# Patient Record
Sex: Male | Born: 1945 | Race: White | Hispanic: No | Smoking: Current every day smoker
Health system: Southern US, Community
[De-identification: ages and names within clinical notes are randomized; demographics above are authoritative.]

## PROBLEM LIST (undated history)

## (undated) DIAGNOSIS — J449 Chronic obstructive pulmonary disease, unspecified: Secondary | ICD-10-CM

## (undated) DIAGNOSIS — E119 Type 2 diabetes mellitus without complications: Secondary | ICD-10-CM

## (undated) DIAGNOSIS — E785 Hyperlipidemia, unspecified: Secondary | ICD-10-CM

## (undated) DIAGNOSIS — M539 Dorsopathy, unspecified: Secondary | ICD-10-CM

## (undated) HISTORY — DX: Type 2 diabetes mellitus without complications: E11.9

## (undated) HISTORY — DX: Hyperlipidemia, unspecified: E78.5

## (undated) HISTORY — DX: Chronic obstructive pulmonary disease, unspecified: J44.9

## (undated) HISTORY — PX: NECK SURGERY: SHX720

## (undated) HISTORY — DX: Dorsopathy, unspecified: M53.9

---

## 1976-08-24 HISTORY — PX: HEMORRHOID SURGERY: SHX153

## 2005-08-24 HISTORY — PX: WRIST SURGERY: SHX841

## 2006-01-01 ENCOUNTER — Inpatient Hospital Stay: Payer: Self-pay | Admitting: Specialist

## 2006-08-24 HISTORY — PX: HERNIA REPAIR: SHX51

## 2007-04-19 ENCOUNTER — Ambulatory Visit: Payer: Self-pay | Admitting: General Surgery

## 2007-04-28 ENCOUNTER — Ambulatory Visit: Payer: Self-pay | Admitting: General Surgery

## 2007-05-13 ENCOUNTER — Ambulatory Visit: Payer: Self-pay | Admitting: General Surgery

## 2007-08-25 HISTORY — PX: ANKLE SURGERY: SHX546

## 2007-12-08 ENCOUNTER — Ambulatory Visit: Payer: Self-pay | Admitting: Vascular Surgery

## 2009-05-13 ENCOUNTER — Ambulatory Visit: Payer: Self-pay | Admitting: Anesthesiology

## 2009-10-22 ENCOUNTER — Ambulatory Visit: Payer: Self-pay | Admitting: Cardiovascular Disease

## 2009-10-22 ENCOUNTER — Inpatient Hospital Stay: Payer: Self-pay | Admitting: Specialist

## 2009-11-05 ENCOUNTER — Ambulatory Visit: Payer: Self-pay | Admitting: Cardiovascular Disease

## 2010-01-29 ENCOUNTER — Emergency Department: Payer: Self-pay | Admitting: Emergency Medicine

## 2010-06-28 ENCOUNTER — Encounter: Admission: RE | Admit: 2010-06-28 | Discharge: 2010-06-28 | Payer: Self-pay | Admitting: Neurosurgery

## 2010-12-27 ENCOUNTER — Emergency Department: Payer: Self-pay | Admitting: Emergency Medicine

## 2011-01-03 ENCOUNTER — Inpatient Hospital Stay: Payer: Self-pay | Admitting: Internal Medicine

## 2011-03-13 ENCOUNTER — Ambulatory Visit: Payer: Self-pay | Admitting: Anesthesiology

## 2014-09-27 DIAGNOSIS — E119 Type 2 diabetes mellitus without complications: Secondary | ICD-10-CM | POA: Diagnosis not present

## 2014-09-27 DIAGNOSIS — R0602 Shortness of breath: Secondary | ICD-10-CM | POA: Diagnosis not present

## 2014-09-27 DIAGNOSIS — J449 Chronic obstructive pulmonary disease, unspecified: Secondary | ICD-10-CM | POA: Diagnosis not present

## 2014-09-27 DIAGNOSIS — F17210 Nicotine dependence, cigarettes, uncomplicated: Secondary | ICD-10-CM | POA: Diagnosis not present

## 2014-11-21 DIAGNOSIS — F17210 Nicotine dependence, cigarettes, uncomplicated: Secondary | ICD-10-CM | POA: Diagnosis not present

## 2014-11-21 DIAGNOSIS — R319 Hematuria, unspecified: Secondary | ICD-10-CM | POA: Diagnosis not present

## 2014-11-21 DIAGNOSIS — N200 Calculus of kidney: Secondary | ICD-10-CM | POA: Diagnosis not present

## 2014-11-21 DIAGNOSIS — I10 Essential (primary) hypertension: Secondary | ICD-10-CM | POA: Diagnosis not present

## 2014-11-21 DIAGNOSIS — N390 Urinary tract infection, site not specified: Secondary | ICD-10-CM | POA: Diagnosis not present

## 2014-11-30 ENCOUNTER — Ambulatory Visit
Admit: 2014-11-30 | Disposition: A | Discharge status: Home / Self Care | Payer: Self-pay | Attending: Nurse Practitioner | Admitting: Nurse Practitioner

## 2014-11-30 DIAGNOSIS — N400 Enlarged prostate without lower urinary tract symptoms: Secondary | ICD-10-CM | POA: Diagnosis not present

## 2014-11-30 DIAGNOSIS — R310 Gross hematuria: Secondary | ICD-10-CM | POA: Diagnosis not present

## 2014-11-30 DIAGNOSIS — R102 Pelvic and perineal pain: Secondary | ICD-10-CM | POA: Diagnosis not present

## 2014-11-30 DIAGNOSIS — R319 Hematuria, unspecified: Secondary | ICD-10-CM | POA: Diagnosis not present

## 2014-11-30 DIAGNOSIS — R300 Dysuria: Secondary | ICD-10-CM | POA: Diagnosis not present

## 2014-12-27 DIAGNOSIS — J449 Chronic obstructive pulmonary disease, unspecified: Secondary | ICD-10-CM | POA: Diagnosis not present

## 2014-12-27 DIAGNOSIS — F17210 Nicotine dependence, cigarettes, uncomplicated: Secondary | ICD-10-CM | POA: Diagnosis not present

## 2014-12-27 DIAGNOSIS — J439 Emphysema, unspecified: Secondary | ICD-10-CM | POA: Diagnosis not present

## 2015-01-17 DIAGNOSIS — R319 Hematuria, unspecified: Secondary | ICD-10-CM | POA: Diagnosis not present

## 2015-01-17 DIAGNOSIS — F17210 Nicotine dependence, cigarettes, uncomplicated: Secondary | ICD-10-CM | POA: Diagnosis not present

## 2015-01-17 DIAGNOSIS — Z0001 Encounter for general adult medical examination with abnormal findings: Secondary | ICD-10-CM | POA: Diagnosis not present

## 2015-01-17 DIAGNOSIS — I10 Essential (primary) hypertension: Secondary | ICD-10-CM | POA: Diagnosis not present

## 2015-01-17 DIAGNOSIS — J449 Chronic obstructive pulmonary disease, unspecified: Secondary | ICD-10-CM | POA: Diagnosis not present

## 2015-01-17 DIAGNOSIS — R300 Dysuria: Secondary | ICD-10-CM | POA: Diagnosis not present

## 2015-03-05 DIAGNOSIS — M461 Sacroiliitis, not elsewhere classified: Secondary | ICD-10-CM | POA: Insufficient documentation

## 2015-07-01 DIAGNOSIS — T63301A Toxic effect of unspecified spider venom, accidental (unintentional), initial encounter: Secondary | ICD-10-CM | POA: Diagnosis not present

## 2015-07-02 DIAGNOSIS — B028 Zoster with other complications: Secondary | ICD-10-CM | POA: Diagnosis not present

## 2015-08-20 DIAGNOSIS — M150 Primary generalized (osteo)arthritis: Secondary | ICD-10-CM | POA: Diagnosis not present

## 2015-08-20 DIAGNOSIS — J449 Chronic obstructive pulmonary disease, unspecified: Secondary | ICD-10-CM | POA: Diagnosis not present

## 2015-08-20 DIAGNOSIS — B028 Zoster with other complications: Secondary | ICD-10-CM | POA: Diagnosis not present

## 2015-08-20 DIAGNOSIS — R21 Rash and other nonspecific skin eruption: Secondary | ICD-10-CM | POA: Diagnosis not present

## 2015-08-20 DIAGNOSIS — F17210 Nicotine dependence, cigarettes, uncomplicated: Secondary | ICD-10-CM | POA: Diagnosis not present

## 2015-09-30 DIAGNOSIS — I10 Essential (primary) hypertension: Secondary | ICD-10-CM | POA: Diagnosis not present

## 2015-09-30 DIAGNOSIS — B028 Zoster with other complications: Secondary | ICD-10-CM | POA: Diagnosis not present

## 2015-09-30 DIAGNOSIS — R21 Rash and other nonspecific skin eruption: Secondary | ICD-10-CM | POA: Diagnosis not present

## 2015-09-30 DIAGNOSIS — J4540 Moderate persistent asthma, uncomplicated: Secondary | ICD-10-CM | POA: Diagnosis not present

## 2015-09-30 DIAGNOSIS — F17210 Nicotine dependence, cigarettes, uncomplicated: Secondary | ICD-10-CM | POA: Diagnosis not present

## 2015-10-02 ENCOUNTER — Other Ambulatory Visit: Payer: Self-pay | Admitting: Nurse Practitioner

## 2015-10-02 DIAGNOSIS — B0229 Other postherpetic nervous system involvement: Secondary | ICD-10-CM

## 2015-10-28 ENCOUNTER — Ambulatory Visit: Payer: Medicare Other | Attending: Nurse Practitioner

## 2015-11-13 DIAGNOSIS — J449 Chronic obstructive pulmonary disease, unspecified: Secondary | ICD-10-CM | POA: Diagnosis not present

## 2015-11-13 DIAGNOSIS — R0602 Shortness of breath: Secondary | ICD-10-CM | POA: Diagnosis not present

## 2016-03-02 DIAGNOSIS — I739 Peripheral vascular disease, unspecified: Secondary | ICD-10-CM | POA: Diagnosis not present

## 2016-03-02 DIAGNOSIS — F17210 Nicotine dependence, cigarettes, uncomplicated: Secondary | ICD-10-CM | POA: Diagnosis not present

## 2016-03-02 DIAGNOSIS — R062 Wheezing: Secondary | ICD-10-CM | POA: Diagnosis not present

## 2016-03-02 DIAGNOSIS — Z0001 Encounter for general adult medical examination with abnormal findings: Secondary | ICD-10-CM | POA: Diagnosis not present

## 2016-03-02 DIAGNOSIS — J4540 Moderate persistent asthma, uncomplicated: Secondary | ICD-10-CM | POA: Diagnosis not present

## 2016-08-31 DIAGNOSIS — R05 Cough: Secondary | ICD-10-CM | POA: Diagnosis not present

## 2016-08-31 DIAGNOSIS — R350 Frequency of micturition: Secondary | ICD-10-CM | POA: Diagnosis not present

## 2016-08-31 DIAGNOSIS — E119 Type 2 diabetes mellitus without complications: Secondary | ICD-10-CM | POA: Diagnosis not present

## 2016-08-31 DIAGNOSIS — M150 Primary generalized (osteo)arthritis: Secondary | ICD-10-CM | POA: Diagnosis not present

## 2016-08-31 DIAGNOSIS — J449 Chronic obstructive pulmonary disease, unspecified: Secondary | ICD-10-CM | POA: Diagnosis not present

## 2016-09-14 DIAGNOSIS — I10 Essential (primary) hypertension: Secondary | ICD-10-CM | POA: Diagnosis not present

## 2016-09-14 DIAGNOSIS — M150 Primary generalized (osteo)arthritis: Secondary | ICD-10-CM | POA: Diagnosis not present

## 2016-09-14 DIAGNOSIS — J069 Acute upper respiratory infection, unspecified: Secondary | ICD-10-CM | POA: Diagnosis not present

## 2016-09-14 DIAGNOSIS — I739 Peripheral vascular disease, unspecified: Secondary | ICD-10-CM | POA: Diagnosis not present

## 2016-11-04 DIAGNOSIS — M792 Neuralgia and neuritis, unspecified: Secondary | ICD-10-CM | POA: Diagnosis not present

## 2016-11-04 DIAGNOSIS — I10 Essential (primary) hypertension: Secondary | ICD-10-CM | POA: Diagnosis not present

## 2016-11-04 DIAGNOSIS — R300 Dysuria: Secondary | ICD-10-CM | POA: Diagnosis not present

## 2016-11-05 ENCOUNTER — Encounter: Payer: Self-pay | Admitting: Urology

## 2016-11-05 ENCOUNTER — Ambulatory Visit (INDEPENDENT_AMBULATORY_CARE_PROVIDER_SITE_OTHER): Payer: Medicare Other | Admitting: Urology

## 2016-11-05 VITALS — BP 184/77 | HR 76 | Ht 68.0 in | Wt 174.7 lb

## 2016-11-05 DIAGNOSIS — N400 Enlarged prostate without lower urinary tract symptoms: Secondary | ICD-10-CM

## 2016-11-05 DIAGNOSIS — N3281 Overactive bladder: Secondary | ICD-10-CM

## 2016-11-05 DIAGNOSIS — R300 Dysuria: Secondary | ICD-10-CM | POA: Diagnosis not present

## 2016-11-05 DIAGNOSIS — N410 Acute prostatitis: Secondary | ICD-10-CM

## 2016-11-05 LAB — URINALYSIS, COMPLETE
BILIRUBIN UA: NEGATIVE
Glucose, UA: NEGATIVE
KETONES UA: NEGATIVE
LEUKOCYTES UA: NEGATIVE
Nitrite, UA: POSITIVE — AB
PROTEIN UA: NEGATIVE
RBC, UA: NEGATIVE
SPEC GRAV UA: 1.025 (ref 1.005–1.030)
Urobilinogen, Ur: 0.2 mg/dL (ref 0.2–1.0)
pH, UA: 5 (ref 5.0–7.5)

## 2016-11-05 LAB — BLADDER SCAN AMB NON-IMAGING: Scan Result: 27

## 2016-11-05 LAB — MICROSCOPIC EXAMINATION
Bacteria, UA: NONE SEEN
RBC MICROSCOPIC, UA: NONE SEEN /HPF (ref 0–?)

## 2016-11-05 MED ORDER — MIRABEGRON ER 50 MG PO TB24: 50.0000 mg | ORAL_TABLET | Freq: Every day | ORAL | 11 refills | Status: DC

## 2016-11-05 MED ORDER — CIPROFLOXACIN HCL 500 MG PO TABS: 500.0000 mg | ORAL_TABLET | Freq: Two times a day (BID) | ORAL | 0 refills | Status: DC

## 2016-11-05 NOTE — Progress Notes (Signed)
11/05/2016 9:46 AM   Brian Key [birth year] [id number]  Referring provider: Lavera Guise, MD [address] [place], [place] [address]  No chief complaint on file.   HPI: The patient is a 71 year old gentleman with a past metal history of BPH on Flomax presents today for urinary urgency and burning with urination. This has been going on for several months. His urine has been free of infection. He did have an infection with Escherichia coli that was pansensitive in March 2016.  He describes his biggest complaint this time is burning with urination. He says it is painful is passing a kidney stone. He does not have any symptoms that are new onset when he urinates. He was started on Cipro and Azo yesterday which has artery causing dramatic decrease in his symptoms. He is approximately 10 days left of Cipro.  The patient also complains of 5-6 year history of urinary urgency. He finds this bothersome particularly during social situations. He occasionally will have incontinence but he usually is able to make it to the bathroom before having incontinent episode. He does feel he empties his bladder. He denies a weak stream. He denies hesitancy or intermittency.  PVR: 26 cc  PMH: No past medical history on file.  Surgical History: No past surgical history on file.  Home Medications:  Allergies as of 11/05/2016   Not on File     Medication List       Accurate as of 11/05/16  9:46 AM. Always use your most recent med list.          ciprofloxacin 500 MG tablet Commonly known as:  CIPRO Take 1 tablet (500 mg total) by mouth every 12 (twelve) hours.   mirabegron ER 50 MG Tb24 tablet Commonly known as:  MYRBETRIQ Take 1 tablet (50 mg total) by mouth daily.       Allergies: Allergies not on file  Family History: No family history on file.  Social History:  reports that he has been smoking.  He has been smoking about 1.00 pack per day. He has never used smokeless tobacco. He  reports that he drinks alcohol. He reports that he does not use drugs.  ROS: UROLOGY Frequent Urination?: Yes Hard to postpone urination?: Yes Burning/pain with urination?: Yes Get up at night to urinate?: Yes Leakage of urine?: No Urine stream starts and stops?: No Trouble starting stream?: No Do you have to strain to urinate?: No Blood in urine?: No Urinary tract infection?: No Sexually transmitted disease?: No Injury to kidneys or bladder?: No Painful intercourse?: No                                      Physical Exam: BP (!) 184/77   Pulse 76   Ht 5\' 8"  (1.727 m)   Wt 174 lb 11.2 oz (79.2 kg)   BMI 26.56 kg/m   Constitutional:  Alert and oriented, No acute distress. HEENT: [place] AT, moist mucus membranes.  Trachea midline, no masses. Cardiovascular: No clubbing, cyanosis, or edema. Respiratory: Normal respiratory effort, no increased work of breathing. GI: Abdomen is soft, nontender, nondistended, no abdominal masses GU: No CVA tenderness. Normal phallus. Testicles descended bilaterally no masses not infectious. DRE: 2+ boggy consistent with prostatitis. Skin: No rashes, bruises or suspicious lesions. Lymph: No cervical or inguinal adenopathy. Neurologic: Grossly intact, no focal deficits, moving all 4 extremities. Psychiatric: Normal mood and affect.  Laboratory Data: No results found for: WBC, HGB, HCT, MCV, PLT  No results found for: CREATININE  No results found for: PSA  No results found for: TESTOSTERONE  No results found for: HGBA1C  Urinalysis No results found for: COLORURINE, APPEARANCEUR, LABSPEC, PHURINE, GLUCOSEU, HGBUR, BILIRUBINUR, KETONESUR, PROTEINUR, UROBILINOGEN, NITRITE, LEUKOCYTESUR   Assessment & Plan:    1. Prostatitis The patient will continue his Cipro. We will give him enough medication at this point to have a full one-month course of antibiotic. I will see him back in proximally 6-8 weeks to assess his  progress  2. Overactive bladder The patient's baseline urinary urgency consistent with overactive bladder. He does not have much in the way of obstructive symptoms. We'll start him him on Myrbetriq 50 mg daily. The patient has a problem with constipation so we will try to avoid anticholinergics.  Return in about 6 weeks (around 12/17/2016).  Nickie Retort, MD  [facility] [address] [place], [place] [address] [phone]

## 2016-12-18 ENCOUNTER — Ambulatory Visit: Payer: Medicare Other

## 2016-12-24 ENCOUNTER — Encounter: Payer: Self-pay | Admitting: Urology

## 2016-12-24 ENCOUNTER — Ambulatory Visit: Payer: Medicare Other | Admitting: Urology

## 2016-12-24 VITALS — BP 154/64 | HR 65 | Ht 68.0 in | Wt 180.0 lb

## 2016-12-24 DIAGNOSIS — N3281 Overactive bladder: Secondary | ICD-10-CM | POA: Diagnosis not present

## 2016-12-24 DIAGNOSIS — N410 Acute prostatitis: Secondary | ICD-10-CM

## 2016-12-24 DIAGNOSIS — N400 Enlarged prostate without lower urinary tract symptoms: Secondary | ICD-10-CM | POA: Diagnosis not present

## 2016-12-24 DIAGNOSIS — R300 Dysuria: Secondary | ICD-10-CM

## 2016-12-24 LAB — URINALYSIS, COMPLETE
BILIRUBIN UA: NEGATIVE
GLUCOSE, UA: NEGATIVE
KETONES UA: NEGATIVE
Leukocytes, UA: NEGATIVE
Nitrite, UA: NEGATIVE
Protein, UA: NEGATIVE
RBC, UA: NEGATIVE
Specific Gravity, UA: 1.025 (ref 1.005–1.030)
UUROB: 0.2 mg/dL (ref 0.2–1.0)
pH, UA: 6 (ref 5.0–7.5)

## 2016-12-24 MED ORDER — TAMSULOSIN HCL 0.4 MG PO CAPS: 0.4000 mg | ORAL_CAPSULE | Freq: Every day | ORAL | 11 refills | Status: DC

## 2016-12-24 NOTE — Progress Notes (Signed)
12/24/2016 11:36 AM   Brian Key 07/06/1946 [id number]  Referring provider: Lavera Guise, MD [address] [place], [place] [address]  Chief Complaint  Patient presents with  . Prostatitis    HPI: The patient is a 71 year old gentleman who presents today for follow-up.  1. Prostatitis   Follows up after being seen in March 2018 when he presented with severe dysuria that was new in onset. When he was sitting, he was on day to a ciprofloxacin his symptoms were starting to resolve. On exam, he had clinical prostatitis and was treated with a month of ciprofloxacin.  The patient still complains of persistent dysuria particularly in his distal penis. This only occurs during urination and ends approximate 1 minute after. He is concerned by this is a very uncomfortable. Does note that there is no spraying of his urine. He has a negative urinalysis today.  2. BPH The patient has been on Flomax in the past. He ran out recently. He would like to get back on obesity thought it helps with his urinary symptoms, consistency or was not as bothersome when he was on this.  3. Urinary urgency At his last visit, the patient was started on Myrbetriq 50 mg for 5 to six-year history of urinary urgency with occasional urge incontinence. He noticed symptomatic improvement with this medication. So has occasional urgency with incontinence that is much better from his baseline. He is happy with how much this medication has helped him would like to continue it.   PMH: Past Medical History:  Diagnosis Date  . COPD (chronic obstructive pulmonary disease) ([place])     Surgical History: Past Surgical History:  Procedure Laterality Date  . HEMORRHOID SURGERY    . NECK SURGERY    . WRIST SURGERY Left     Home Medications:  Allergies as of 12/24/2016      Reactions   Penicillins    Other reaction(s): Unknown      Medication List       Accurate as of 12/24/16 11:36 AM. Always use your most recent med  list.          albuterol (2.5 MG/3ML) 0.083% nebulizer solution Commonly known as:  PROVENTIL   BREO ELLIPTA 100-25 MCG/INH Aepb Generic drug:  fluticasone furoate-vilanterol   ciprofloxacin 500 MG tablet Commonly known as:  CIPRO Take 1 tablet (500 mg total) by mouth every 12 (twelve) hours.   gabapentin 600 MG tablet Commonly known as:  NEURONTIN take 1-2 tablets by mouth two to three times a day   mirabegron ER 50 MG Tb24 tablet Commonly known as:  MYRBETRIQ Take 1 tablet (50 mg total) by mouth daily.   tamsulosin 0.4 MG Caps capsule Commonly known as:  FLOMAX Take 1 capsule (0.4 mg total) by mouth daily.       Allergies:  Allergies  Allergen Reactions  . Penicillins     Other reaction(s): Unknown    Family History: Family History  Problem Relation Age of Onset  . Prostate cancer Neg Hx   . Bladder Cancer Neg Hx   . Kidney cancer Neg Hx     Social History:  reports that he has been smoking.  He has been smoking about 1.00 pack per day. He has never used smokeless tobacco. He reports that he drinks alcohol. He reports that he does not use drugs.  ROS: UROLOGY Frequent Urination?: No Hard to postpone urination?: Yes Burning/pain with urination?: Yes Get up at night to urinate?: No Leakage  of urine?: No Urine stream starts and stops?: No Trouble starting stream?: No Do you have to strain to urinate?: No Blood in urine?: No Urinary tract infection?: No Sexually transmitted disease?: No Injury to kidneys or bladder?: No Painful intercourse?: No Weak stream?: No Erection problems?: No Penile pain?: Yes  Gastrointestinal Nausea?: No Vomiting?: No Indigestion/heartburn?: No Diarrhea?: No Constipation?: No  Constitutional Fever: No Night sweats?: Yes Weight loss?: No Fatigue?: No  Skin Skin rash/lesions?: No Itching?: No  Eyes Blurred vision?: No Double vision?: No  Ears/Nose/Throat Sore throat?: No Sinus problems?:  No  Hematologic/Lymphatic Swollen glands?: No Easy bruising?: No  Cardiovascular Leg swelling?: No Chest pain?: No  Respiratory Cough?: No Shortness of breath?: No  Endocrine Excessive thirst?: No  Musculoskeletal Back pain?: Yes Joint pain?: No  Neurological Headaches?: No Dizziness?: No  Psychologic Depression?: No Anxiety?: No  Physical Exam: BP (!) 154/64 (BP Location: Left Arm, Patient Position: Sitting, Cuff Size: Normal)   Pulse 65   Ht 5\' 8"  (1.727 m)   Wt 180 lb (81.6 kg)   BMI 27.37 kg/m   Constitutional:  Alert and oriented, No acute distress. HEENT: [place] AT, moist mucus membranes.  Trachea midline, no masses. Cardiovascular: No clubbing, cyanosis, or edema. Respiratory: Normal respiratory effort, no increased work of breathing. GI: Abdomen is soft, nontender, nondistended, no abdominal masses GU: No CVA tenderness.  Skin: No rashes, bruises or suspicious lesions. Lymph: No cervical or inguinal adenopathy. Neurologic: Grossly intact, no focal deficits, moving all 4 extremities. Psychiatric: Normal mood and affect.  Laboratory Data: No results found for: WBC, HGB, HCT, MCV, PLT  No results found for: CREATININE  No results found for: PSA  No results found for: TESTOSTERONE  No results found for: HGBA1C  Urinalysis    Component Value Date/Time   APPEARANCEUR Clear 11/05/2016 0903   GLUCOSEU Negative 11/05/2016 0903   BILIRUBINUR Negative 11/05/2016 0903   PROTEINUR Negative 11/05/2016 0903   NITRITE Positive (A) 11/05/2016 0903   LEUKOCYTESUR Negative 11/05/2016 0903    Assessment & Plan:   1. BPH -restart flomax  2. OAB -continue Myrbetriq 50 mg  3. Prostatitis -resolved  4. Dysuria The patient has persistent dysuria after being treated for prostatitis. His infection is clinically resolved. I think at this point it would be reasonable to undergo cystoscopy to rule out urethral stricture as a possible source for this discomfort.  The patient is agreeable to proceeding with this procedure. We may want to consider obtaining a urine cytology during this procedure given his smoking history and his dysuria and OAB.  5. Prostate cancer screening The patient is requesting a PSA today. We'll check this value. His DRE at his last visit suggested infection but no sign of malignancy.  Return for cystoscopy.  Nickie Retort, MD  [facility] [address] [place], [place] [address] [phone]

## 2016-12-25 ENCOUNTER — Telehealth: Payer: Self-pay

## 2016-12-25 LAB — PSA: Prostate Specific Ag, Serum: 1 ng/mL (ref 0.0–4.0)

## 2016-12-25 NOTE — Telephone Encounter (Signed)
Brian Retort, MD  Lestine Box, LPN        Please let patient know PSA is normal. Thanks    [facility]- labs stable.

## 2017-01-20 ENCOUNTER — Ambulatory Visit (INDEPENDENT_AMBULATORY_CARE_PROVIDER_SITE_OTHER): Payer: Medicare Other | Admitting: Urology

## 2017-01-20 ENCOUNTER — Encounter: Payer: Self-pay | Admitting: Urology

## 2017-01-20 VITALS — BP 146/63 | HR 80 | Ht 68.0 in | Wt 176.9 lb

## 2017-01-20 DIAGNOSIS — N400 Enlarged prostate without lower urinary tract symptoms: Secondary | ICD-10-CM

## 2017-01-20 DIAGNOSIS — R300 Dysuria: Secondary | ICD-10-CM | POA: Diagnosis not present

## 2017-01-20 DIAGNOSIS — N3281 Overactive bladder: Secondary | ICD-10-CM

## 2017-01-20 MED ORDER — CIPROFLOXACIN HCL 500 MG PO TABS
500.0000 mg | ORAL_TABLET | Freq: Once | ORAL | Status: AC
  Administered 2017-01-20: 500 mg via ORAL

## 2017-01-20 MED ORDER — TAMSULOSIN HCL 0.4 MG PO CAPS: 0.8000 mg | ORAL_CAPSULE | Freq: Every day | ORAL | 11 refills | Status: DC

## 2017-01-20 MED ORDER — LIDOCAINE HCL 2 % EX GEL
1.0000 "application " | Freq: Once | CUTANEOUS | Status: AC
  Administered 2017-01-20: 1 via URETHRAL

## 2017-01-20 NOTE — Progress Notes (Signed)
   01/20/17  CC:  Chief Complaint  Patient presents with  . Cysto    HPI: The patient is a 71 year old gentleman who presents today for follow-up.  1. Dysuria Presents for cystoscopy for dysuria at tip of his penis that has been present since recent prostatitis bout. Urinalysis negative. The only occurs during urination last for approximately 1 minute. He had a slight improvement when starting Flomax back up at his last visit.  2. BPH Patient currently on flomax.   3. Urinary urgency At his last visit, the patient was started on Myrbetriq 50 mg for 5 to six-year history of urinary urgency with occasional urge incontinence. He noticed symptomatic improvement with this medication. So has occasional urgency with incontinence that is much better from his baseline. He is happy with how much this medication has helped him would like to continue it.  Blood pressure (!) 146/63, pulse 80, height 5\' 8"  (1.727 m), weight 176 lb 14.4 oz (80.2 kg). NED. A&Ox3.   No respiratory distress   Abd soft, NT, ND Normal phallus with bilateral descended testicles  Cystoscopy Procedure Note  Patient identification was confirmed, informed consent was obtained, and patient was prepped using Betadine solution.  Lidocaine jelly was administered per urethral meatus.    Preoperative abx where received prior to procedure.     Pre-Procedure: - Inspection reveals a normal caliber ureteral meatus.  Procedure: The flexible cystoscope was introduced without difficulty - No urethral strictures/lesions are present. - Enlarged prostate Visually obstructive - 7 cm in length - Elevated bladder neck - Bilateral ureteral orifices identified - Bladder mucosa  reveals no ulcers, tumors, or lesions - No bladder stones - No trabeculation  Retroflexion shows intravesical lobe   Post-Procedure: - Patient tolerated the procedure well  Assessment/ Plan:  1. BPH -will increase Flomax to 0.8 mg as a seem to  help with some of his dysuria.  2. OAB -continue Myrbetriq 50 mg  3. Dysuria -no evidence of stricture -will send urine for cytology due to significant smoking history -advised to try Azo if burning worsens  Follow-up in 3 months or if symptoms worsen sooner.

## 2017-01-21 LAB — URINALYSIS, COMPLETE
Bilirubin, UA: NEGATIVE
Glucose, UA: NEGATIVE
LEUKOCYTES UA: NEGATIVE
Nitrite, UA: NEGATIVE
PH UA: 6 (ref 5.0–7.5)
PROTEIN UA: NEGATIVE
RBC, UA: NEGATIVE
Specific Gravity, UA: 1.02 (ref 1.005–1.030)
UUROB: 0.2 mg/dL (ref 0.2–1.0)

## 2017-01-21 LAB — MICROSCOPIC EXAMINATION
BACTERIA UA: NONE SEEN
Epithelial Cells (non renal): NONE SEEN /hpf (ref 0–10)

## 2017-01-22 ENCOUNTER — Telehealth: Payer: Self-pay | Admitting: Urology

## 2017-01-22 NOTE — Telephone Encounter (Signed)
Brian Key called the office today.  The urine cytology specimen that was sent for this patient spilled during transit and there is not enough specimen to perform urine cytology.  Please contact the patient to come to the office to submit another urine sample to submit for urine cytology and add to the lab schedule.

## 2017-01-22 NOTE — Telephone Encounter (Signed)
Spoke to spouse and patient. Gave info per previous message. Spouse and patient verbalized understanding.  Pt will come by office next week.

## 2017-01-25 ENCOUNTER — Other Ambulatory Visit: Payer: Self-pay | Admitting: Urology

## 2017-01-26 ENCOUNTER — Other Ambulatory Visit: Payer: Medicare Other

## 2017-01-27 DIAGNOSIS — R300 Dysuria: Secondary | ICD-10-CM | POA: Diagnosis not present

## 2017-01-29 NOTE — Telephone Encounter (Signed)
Pt brought urine sample. Urine Cytology sent on 06/06.

## 2017-02-01 ENCOUNTER — Other Ambulatory Visit: Payer: Self-pay | Admitting: Urology

## 2017-02-03 ENCOUNTER — Encounter: Payer: Self-pay | Admitting: *Deleted

## 2017-02-03 ENCOUNTER — Other Ambulatory Visit: Payer: Self-pay | Admitting: Nurse Practitioner

## 2017-02-03 DIAGNOSIS — L723 Sebaceous cyst: Secondary | ICD-10-CM | POA: Diagnosis not present

## 2017-02-03 DIAGNOSIS — M545 Low back pain: Secondary | ICD-10-CM | POA: Diagnosis not present

## 2017-02-03 DIAGNOSIS — J449 Chronic obstructive pulmonary disease, unspecified: Secondary | ICD-10-CM | POA: Diagnosis not present

## 2017-02-03 DIAGNOSIS — M5116 Intervertebral disc disorders with radiculopathy, lumbar region: Secondary | ICD-10-CM | POA: Diagnosis not present

## 2017-02-09 ENCOUNTER — Ambulatory Visit
Admission: RE | Admit: 2017-02-09 | Discharge: 2017-02-09 | Disposition: A | Discharge status: Home / Self Care | Payer: Medicare Other | Source: Ambulatory Visit | Attending: Nurse Practitioner | Admitting: Nurse Practitioner

## 2017-02-09 DIAGNOSIS — M5126 Other intervertebral disc displacement, lumbar region: Secondary | ICD-10-CM | POA: Diagnosis not present

## 2017-02-09 DIAGNOSIS — M545 Low back pain: Secondary | ICD-10-CM | POA: Diagnosis present

## 2017-02-09 DIAGNOSIS — M48061 Spinal stenosis, lumbar region without neurogenic claudication: Secondary | ICD-10-CM | POA: Diagnosis not present

## 2017-02-09 DIAGNOSIS — M47816 Spondylosis without myelopathy or radiculopathy, lumbar region: Secondary | ICD-10-CM | POA: Diagnosis not present

## 2017-02-09 DIAGNOSIS — M2578 Osteophyte, vertebrae: Secondary | ICD-10-CM | POA: Diagnosis not present

## 2017-02-16 ENCOUNTER — Encounter: Payer: Self-pay | Admitting: General Surgery

## 2017-02-16 ENCOUNTER — Ambulatory Visit (INDEPENDENT_AMBULATORY_CARE_PROVIDER_SITE_OTHER): Payer: Medicare Other | Admitting: General Surgery

## 2017-02-16 VITALS — BP 140/80 | HR 60 | Resp 14 | Ht 69.0 in | Wt 183.0 lb

## 2017-02-16 DIAGNOSIS — D213 Benign neoplasm of connective and other soft tissue of thorax: Secondary | ICD-10-CM

## 2017-02-16 DIAGNOSIS — D171 Benign lipomatous neoplasm of skin and subcutaneous tissue of trunk: Secondary | ICD-10-CM | POA: Insufficient documentation

## 2017-02-16 HISTORY — PX: LIPOMA EXCISION: SHX5283

## 2017-02-16 NOTE — Progress Notes (Signed)
Patient ID: Brian Key, male   DOB: [birth year], 71 y.o.   MRN: [id number]  Chief Complaint  Patient presents with  . Cyst    HPI Brian Key is a 71 y.o. male.  Here for evaluation of a possible cyst on his back referred by Juliette Alcide. He states it has been there for over 10 years. He states 7-8 years ago, it was removed.Marland Kitchen He states it has gotten larger with a little pain over the past year.  Most recent HgbA1C was 5.5 2 months ago.  HPI  Past Medical History:  Diagnosis Date  . Back problem   . COPD (chronic obstructive pulmonary disease) ([place])     Past Surgical History:  Procedure Laterality Date  . ANKLE SURGERY  2009  . [place]  . HERNIA REPAIR  7425   umbilical  . NECK SURGERY    . WRIST SURGERY Left 2007    Family History  Problem Relation Age of Onset  . Prostate cancer Neg Hx   . Bladder Cancer Neg Hx   . Kidney cancer Neg Hx     Social History Social History  Substance Use Topics  . Smoking status: Current Every Day Smoker    Packs/day: 1.00    Years: 50.00  . Smokeless tobacco: Never Used  . Alcohol use Yes     Comment: occasionally    Allergies  Allergen Reactions  . Penicillins Rash    Other reaction(s): Unknown    Current Outpatient Prescriptions  Medication Sig Dispense Refill  . albuterol (PROVENTIL) (2.5 MG/3ML) 0.083% nebulizer solution   0  . BREO ELLIPTA 100-25 MCG/INH AEPB     . meloxicam (MOBIC) 15 MG tablet take 1 tablet by mouth once daily if needed for arthritis pain  0  . tamsulosin (FLOMAX) 0.4 MG CAPS capsule Take 1 capsule (0.4 mg total) by mouth daily. 30 capsule 11  . tiZANidine (ZANAFLEX) 4 MG tablet take 1/2-1 tablet by mouth at bedtime if needed for muscle spasm  0   No current facility-administered medications for this visit.     Review of Systems Review of Systems  Constitutional: Negative.   Respiratory: Negative.   Cardiovascular: Negative.     Blood pressure 140/80, pulse 60,  resp. rate 14, height 5\' 9"  (1.753 m), weight 183 lb (83 kg).  Physical Exam Physical Exam  Constitutional: He is oriented to person, place, and time. He appears well-developed and well-nourished.  HENT:  Mouth/Throat: Oropharynx is clear and moist.  Eyes: Conjunctivae are normal. No scleral icterus.  Neck: Neck supple.  Pulmonary/Chest:      Abdominal:    Lymphadenopathy:    He has no cervical adenopathy.  Neurological: He is alert and oriented to person, place, and time.  Skin: Skin is warm, dry and intact.  5 x 6 cm nodule left of midline T9  Psychiatric: His behavior is normal.    Data Reviewed February 03, 2017 PCP note completed by Leretha Pol, NP.   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    Plan    Elective excision was reviewed. The patient was amenable to proceed. ChloraPrep was applied to the skin. 20 mL of 0.5% Xylocaine with 0.25% Marcaine with 1-200,000 of epinephrine was utilized well tolerated. ChloraPrep was applied to the skin once again. The wound was draped. A transverse incision was utilized transversing the original curvilinear incision over the mass. The skin and subcutaneous tissue is divided sharply.  The lipoma was excised from the underlying soft tissue extending down to but not through the underlying fascia making use of sharp dissection. A single arterial bleeder was controlled with a 3-0 Vicryl suture ligature. The deep tissue was approximated with an interrupted 3-0 suture to obliterate dead space. The skin was closed with a running 3-0 Vicryl septic suture. Benzoin, Steri-Strips, Telfa and Tegaderm dressing applied.  The patient will make use of ice for local comfort.    Wound care was discussed. He is welcome to return next week for nursing check if desired. He'll be contacted with pathology results are available.  HPI, Physical Exam, Assessment and Plan have been scribed under the direction and in the presence of Robert Bellow, MD.  Karie Fetch, RN  I have completed the exam and reviewed the above documentation for accuracy and completeness.  I agree with the above.  Haematologist has been used and any errors in dictation or transcription are unintentional.  Hervey Ard, M.D., F.A.C.S.  Robert Bellow 02/16/2017, 9:03 PM

## 2017-02-16 NOTE — Patient Instructions (Signed)
The patient is aware to call back for any questions or concerns. May shower May remove dressing in 2-3 days Steri strips will gradually come off over 2-3 weeks May use an Ice pack as needed for comfort  

## 2017-02-23 ENCOUNTER — Other Ambulatory Visit: Payer: Self-pay | Admitting: Urology

## 2017-02-25 ENCOUNTER — Telehealth: Payer: Self-pay

## 2017-02-25 NOTE — Telephone Encounter (Signed)
-----   Message from Robert Bellow, MD sent at 02/25/2017  9:13 AM EDT ----- Please notify the patient that the tissue removed was indeed a fatty growth as anticipated. Follow up if needed. Thank you ----- Message ----- From: Interface, Lab In [place] Sent: 02/24/2017  11:27 PM To: Robert Bellow, MD

## 2017-02-26 NOTE — Telephone Encounter (Signed)
Notified patient as instructed, patient pleased. Discussed follow-up appointments, patient agrees  

## 2017-03-05 DIAGNOSIS — E782 Mixed hyperlipidemia: Secondary | ICD-10-CM | POA: Diagnosis not present

## 2017-03-05 DIAGNOSIS — I10 Essential (primary) hypertension: Secondary | ICD-10-CM | POA: Diagnosis not present

## 2017-03-05 DIAGNOSIS — Z0001 Encounter for general adult medical examination with abnormal findings: Secondary | ICD-10-CM | POA: Diagnosis not present

## 2017-03-05 DIAGNOSIS — J449 Chronic obstructive pulmonary disease, unspecified: Secondary | ICD-10-CM | POA: Diagnosis not present

## 2017-03-05 DIAGNOSIS — I739 Peripheral vascular disease, unspecified: Secondary | ICD-10-CM | POA: Diagnosis not present

## 2017-03-18 ENCOUNTER — Other Ambulatory Visit: Payer: Self-pay | Admitting: Neurosurgery

## 2017-03-18 DIAGNOSIS — R292 Abnormal reflex: Secondary | ICD-10-CM | POA: Diagnosis not present

## 2017-03-18 DIAGNOSIS — I739 Peripheral vascular disease, unspecified: Secondary | ICD-10-CM | POA: Diagnosis not present

## 2017-03-18 DIAGNOSIS — M47812 Spondylosis without myelopathy or radiculopathy, cervical region: Secondary | ICD-10-CM | POA: Diagnosis not present

## 2017-03-18 DIAGNOSIS — Z981 Arthrodesis status: Secondary | ICD-10-CM | POA: Diagnosis not present

## 2017-03-22 DIAGNOSIS — I739 Peripheral vascular disease, unspecified: Secondary | ICD-10-CM | POA: Diagnosis not present

## 2017-03-31 ENCOUNTER — Ambulatory Visit
Admission: RE | Admit: 2017-03-31 | Discharge: 2017-03-31 | Disposition: A | Discharge status: Home / Self Care | Payer: Medicare Other | Source: Ambulatory Visit | Attending: Neurosurgery | Admitting: Neurosurgery

## 2017-03-31 DIAGNOSIS — M50221 Other cervical disc displacement at C4-C5 level: Secondary | ICD-10-CM | POA: Diagnosis not present

## 2017-03-31 DIAGNOSIS — Z981 Arthrodesis status: Secondary | ICD-10-CM

## 2017-03-31 DIAGNOSIS — M5021 Other cervical disc displacement,  high cervical region: Secondary | ICD-10-CM | POA: Diagnosis not present

## 2017-03-31 DIAGNOSIS — M50223 Other cervical disc displacement at C6-C7 level: Secondary | ICD-10-CM | POA: Diagnosis not present

## 2017-03-31 DIAGNOSIS — R292 Abnormal reflex: Secondary | ICD-10-CM

## 2017-03-31 DIAGNOSIS — M50222 Other cervical disc displacement at C5-C6 level: Secondary | ICD-10-CM | POA: Diagnosis not present

## 2017-04-22 ENCOUNTER — Encounter: Payer: Self-pay | Admitting: Urology

## 2017-04-22 ENCOUNTER — Ambulatory Visit (INDEPENDENT_AMBULATORY_CARE_PROVIDER_SITE_OTHER): Payer: Medicare Other | Admitting: Urology

## 2017-04-22 VITALS — BP 123/62 | HR 71 | Ht 69.0 in | Wt 176.1 lb

## 2017-04-22 DIAGNOSIS — N400 Enlarged prostate without lower urinary tract symptoms: Secondary | ICD-10-CM

## 2017-04-22 DIAGNOSIS — R300 Dysuria: Secondary | ICD-10-CM | POA: Diagnosis not present

## 2017-04-22 NOTE — Progress Notes (Signed)
04/22/2017 4:33 PM   [name] 10-Jan-1946 [id number]  Referring provider: Lavera Guise, [place], [place] [address]  Chief Complaint  Patient presents with  . Over Active Bladder    HPI: The patient is a 71 year old gentleman who presents today for follow-up.  1. Dysuria Presents for cystoscopy for dysuria at tip of his penis that has been present since recent prostatitis bout. Urinalysis negative. The only occurs during urination last for approximately 1 minute. He had a slight improvement when starting Flomax. His flomax was increased at his last visit. He feels that this is slowly improving is not currently bothered by this.  2. BPH Patient currently on flomax 0.8 mg daily.   Patient with his obstructive 7 cm in length prostate and cystoscopy in May 2018. He also had intravesical lobe. Urine cytology was negative.  3. Urinary urgency At his last visit, the patient was started on Myrbetriq 50 mg for 5 to 6 year history of urinary urgency with occasional urge incontinence. He noticed symptomatic improvement with this medication. He however ran out of his medication. He has no wire taking. However, his symptoms have resolved. He has no significant urgency that he finds bothersome. No urge incontinence.     PMH: Past Medical History:  Diagnosis Date  . Back problem   . COPD (chronic obstructive pulmonary disease) ([place])     Surgical History: Past Surgical History:  Procedure Laterality Date  . ANKLE SURGERY  2009  . [place]  . HERNIA REPAIR  0109   umbilical  . NECK SURGERY    . WRIST SURGERY Left 2007    Home Medications:  Allergies as of 04/22/2017      Reactions   Penicillins Rash   Other reaction(s): Unknown      Medication List       Accurate as of 04/22/17  4:33 PM. Always use your most recent med list.          albuterol (2.5 MG/3ML) 0.083% nebulizer solution Commonly known as:  PROVENTIL   BREO ELLIPTA  100-25 MCG/INH Aepb Generic drug:  fluticasone furoate-vilanterol   meloxicam 15 MG tablet Commonly known as:  MOBIC take 1 tablet by mouth once daily if needed for arthritis pain   tamsulosin 0.4 MG Caps capsule Commonly known as:  FLOMAX Take 1 capsule (0.4 mg total) by mouth daily.   tiZANidine 4 MG tablet Commonly known as:  ZANAFLEX take 1/2-1 tablet by mouth at bedtime if needed for muscle spasm       Allergies:  Allergies  Allergen Reactions  . Penicillins Rash    Other reaction(s): Unknown    Family History: Family History  Problem Relation Age of Onset  . Prostate cancer Neg Hx   . Bladder Cancer Neg Hx   . Kidney cancer Neg Hx     Social History:  reports that he has been smoking.  He has a 50.00 pack-year smoking history. He has never used smokeless tobacco. He reports that he drinks alcohol. He reports that he does not use drugs.  ROS: UROLOGY Frequent Urination?: No Hard to postpone urination?: No Burning/pain with urination?: Yes Get up at night to urinate?: No Leakage of urine?: No Urine stream starts and stops?: No Trouble starting stream?: No Do you have to strain to urinate?: No Blood in urine?: No Urinary tract infection?: No Sexually transmitted disease?: No Injury to kidneys or bladder?: No Painful intercourse?: No Weak stream?: No Erection  problems?: No Penile pain?: No  Gastrointestinal Nausea?: No Vomiting?: No Indigestion/heartburn?: No Diarrhea?: No Constipation?: No  Constitutional Fever: No Night sweats?: Yes Weight loss?: No Fatigue?: No  Skin Skin rash/lesions?: No Itching?: No  Eyes Blurred vision?: No Double vision?: No  Ears/Nose/Throat Sore throat?: No Sinus problems?: No  Hematologic/Lymphatic Swollen glands?: No Easy bruising?: No  Cardiovascular Leg swelling?: No Chest pain?: No  Respiratory Cough?: Yes Shortness of breath?: No  Endocrine Excessive thirst?: No  Musculoskeletal Back  pain?: Yes Joint pain?: No  Neurological Headaches?: No Dizziness?: No  Psychologic Depression?: No Anxiety?: No  Physical Exam: BP 123/62 (BP Location: Left Arm, Patient Position: Sitting, Cuff Size: Normal)   Pulse 71   Ht 5\' 9"  (1.753 m)   Wt 176 lb 1.6 oz (79.9 kg)   BMI 26.01 kg/m   Constitutional:  Alert and oriented, No acute distress. HEENT: [place] AT, moist mucus membranes.  Trachea midline, no masses. Cardiovascular: No clubbing, cyanosis, or edema. Respiratory: Normal respiratory effort, no increased work of breathing. GI: Abdomen is soft, nontender, nondistended, no abdominal masses GU: No CVA tenderness.  Skin: No rashes, bruises or suspicious lesions. Lymph: No cervical or inguinal adenopathy. Neurologic: Grossly intact, no focal deficits, moving all 4 extremities. Psychiatric: Normal mood and affect.  Laboratory Data: No results found for: WBC, HGB, HCT, MCV, PLT  No results found for: CREATININE  No results found for: PSA  No results found for: TESTOSTERONE  No results found for: HGBA1C  Urinalysis    Component Value Date/Time   APPEARANCEUR Clear 01/20/2017 1601   GLUCOSEU Negative 01/20/2017 1601   BILIRUBINUR Negative 01/20/2017 1601   PROTEINUR Negative 01/20/2017 1601   NITRITE Negative 01/20/2017 1601   LEUKOCYTESUR Negative 01/20/2017 1601    Assessment & Plan:    1. BPH -continue Flomax to 0.8 mg  2. Dysuria -no evidence of stricture with negative urine cytology. However this continues to improve and is not currently bothersome to the patient. -advised to try Azo if burning worsens  Return in about 1 year (around 04/22/2018).  Nickie Retort, MD  [facility] [address], [place], [place] [address] [phone]

## 2017-06-03 DIAGNOSIS — J069 Acute upper respiratory infection, unspecified: Secondary | ICD-10-CM | POA: Diagnosis not present

## 2017-06-03 DIAGNOSIS — J449 Chronic obstructive pulmonary disease, unspecified: Secondary | ICD-10-CM | POA: Diagnosis not present

## 2017-06-03 DIAGNOSIS — M792 Neuralgia and neuritis, unspecified: Secondary | ICD-10-CM | POA: Diagnosis not present

## 2017-06-03 DIAGNOSIS — M5116 Intervertebral disc disorders with radiculopathy, lumbar region: Secondary | ICD-10-CM | POA: Diagnosis not present

## 2017-06-07 DIAGNOSIS — M542 Cervicalgia: Secondary | ICD-10-CM | POA: Diagnosis not present

## 2017-06-07 DIAGNOSIS — R030 Elevated blood-pressure reading, without diagnosis of hypertension: Secondary | ICD-10-CM | POA: Diagnosis not present

## 2017-06-07 DIAGNOSIS — M545 Low back pain: Secondary | ICD-10-CM | POA: Diagnosis not present

## 2017-06-21 DIAGNOSIS — M5416 Radiculopathy, lumbar region: Secondary | ICD-10-CM | POA: Diagnosis not present

## 2017-06-21 DIAGNOSIS — M48062 Spinal stenosis, lumbar region with neurogenic claudication: Secondary | ICD-10-CM | POA: Diagnosis not present

## 2017-07-06 DIAGNOSIS — M48062 Spinal stenosis, lumbar region with neurogenic claudication: Secondary | ICD-10-CM | POA: Diagnosis not present

## 2017-07-06 DIAGNOSIS — M5416 Radiculopathy, lumbar region: Secondary | ICD-10-CM | POA: Diagnosis not present

## 2017-09-02 ENCOUNTER — Ambulatory Visit: Payer: Self-pay | Admitting: Nurse Practitioner

## 2017-09-27 ENCOUNTER — Other Ambulatory Visit: Payer: Self-pay | Admitting: Internal Medicine

## 2017-09-27 DIAGNOSIS — R69 Illness, unspecified: Secondary | ICD-10-CM | POA: Diagnosis not present

## 2017-09-27 MED ORDER — BREO ELLIPTA 100-25 MCG/INH IN AEPB: 1.0000 | INHALATION_SPRAY | Freq: Every day | RESPIRATORY_TRACT | 1 refills | Status: DC

## 2017-09-27 MED ORDER — ALBUTEROL SULFATE (2.5 MG/3ML) 0.083% IN NEBU: 2.5000 mg | INHALATION_SOLUTION | RESPIRATORY_TRACT | 0 refills | Status: DC | PRN

## 2017-10-19 ENCOUNTER — Ambulatory Visit (INDEPENDENT_AMBULATORY_CARE_PROVIDER_SITE_OTHER): Payer: Medicare HMO | Admitting: Internal Medicine

## 2017-10-19 ENCOUNTER — Encounter: Payer: Self-pay | Admitting: Internal Medicine

## 2017-10-19 VITALS — BP 158/76 | HR 71 | Resp 16 | Ht 69.0 in | Wt 178.8 lb

## 2017-10-19 DIAGNOSIS — M461 Sacroiliitis, not elsewhere classified: Secondary | ICD-10-CM | POA: Diagnosis not present

## 2017-10-19 DIAGNOSIS — J209 Acute bronchitis, unspecified: Secondary | ICD-10-CM | POA: Diagnosis not present

## 2017-10-19 DIAGNOSIS — J440 Chronic obstructive pulmonary disease with acute lower respiratory infection: Secondary | ICD-10-CM | POA: Diagnosis not present

## 2017-10-19 DIAGNOSIS — I739 Peripheral vascular disease, unspecified: Secondary | ICD-10-CM | POA: Diagnosis not present

## 2017-10-19 DIAGNOSIS — E785 Hyperlipidemia, unspecified: Secondary | ICD-10-CM

## 2017-10-19 DIAGNOSIS — I10 Essential (primary) hypertension: Secondary | ICD-10-CM

## 2017-10-19 MED ORDER — DULOXETINE HCL 20 MG PO CPEP
20.0000 mg | ORAL_CAPSULE | Freq: Every day | ORAL | 3 refills | Status: DC
Start: 2017-10-19 — End: 2017-11-16

## 2017-10-19 MED ORDER — ALBUTEROL SULFATE HFA 108 (90 BASE) MCG/ACT IN AERS: 2.0000 | INHALATION_SPRAY | Freq: Four times a day (QID) | RESPIRATORY_TRACT | 0 refills | Status: DC | PRN

## 2017-10-19 MED ORDER — CILOSTAZOL 50 MG PO TABS: 50.0000 mg | ORAL_TABLET | Freq: Two times a day (BID) | ORAL | 3 refills | Status: DC

## 2017-10-19 MED ORDER — MELOXICAM 15 MG PO TABS: 15.0000 mg | ORAL_TABLET | Freq: Every day | ORAL | 3 refills | Status: DC

## 2017-10-19 MED ORDER — BREO ELLIPTA 100-25 MCG/INH IN AEPB: 1.0000 | INHALATION_SPRAY | Freq: Every day | RESPIRATORY_TRACT | 5 refills | Status: DC

## 2017-10-19 MED ORDER — BISOPROLOL-HYDROCHLOROTHIAZIDE 2.5-6.25 MG PO TABS: 1.0000 | ORAL_TABLET | Freq: Every day | ORAL | 3 refills | Status: DC

## 2017-10-19 NOTE — Progress Notes (Signed)
[facility] [place], [place] [address]  Internal MEDICINE  Office Visit Note  Patient Name: Brian Key  [id number]  [id number]  Date of Service: 10/19/2017  Chief Complaint  Patient presents with  . Osteoarthritis  . COPD    HPI  Pt is here for routine follow up.has multiple medical problems. He will like to get all his refills. Pt does think that his pain has been addressed. He does not have answer to his complaints. He does have PVD. Continues to smoke    Current Medication: Outpatient Encounter Medications as of 10/19/2017  Medication Sig  . albuterol (PROVENTIL HFA;VENTOLIN HFA) 108 (90 Base) MCG/ACT inhaler Inhale 2 puffs into the lungs every 6 (six) hours as needed for wheezing or shortness of breath.  . bisoprolol-hydrochlorothiazide (ZIAC) 2.5-6.25 MG tablet Take 1 tablet by mouth daily.  Marland Kitchen BREO ELLIPTA 100-25 MCG/INH AEPB Inhale 1 puff into the lungs daily.  . cilostazol (PLETAL) 50 MG tablet Take 1 tablet (50 mg total) by mouth 2 (two) times daily.  . meloxicam (MOBIC) 15 MG tablet Take 1 tablet (15 mg total) by mouth daily.  . tamsulosin (FLOMAX) 0.4 MG CAPS capsule Take 1 capsule (0.4 mg total) by mouth daily.  . [DISCONTINUED] albuterol (PROVENTIL) (2.5 MG/3ML) 0.083% nebulizer solution Take 3 mLs (2.5 mg total) by nebulization every 4 (four) hours as needed for wheezing or shortness of breath (every 4 to 6 hrs).  . [DISCONTINUED] BREO ELLIPTA 100-25 MCG/INH AEPB Inhale 1 puff into the lungs daily.  . [DISCONTINUED] cilostazol (PLETAL) 50 MG tablet take 1 tablet by mouth twice a day FOR LEG PAIN  . [DISCONTINUED] meloxicam (MOBIC) 15 MG tablet take 1 tablet by mouth once daily if needed for arthritis pain  . [DISCONTINUED] tiZANidine (ZANAFLEX) 4 MG tablet take 1/2-1 tablet by mouth at bedtime if needed for muscle spasm   No facility-administered encounter medications on file as of 10/19/2017.     Surgical History: Past Surgical  History:  Procedure Laterality Date  . ANKLE SURGERY  2009  . [place]  . HERNIA REPAIR  5009   umbilical  . NECK SURGERY    . WRIST SURGERY Left 2007    Medical History: Past Medical History:  Diagnosis Date  . Back problem   . COPD (chronic obstructive pulmonary disease) (HCC)     Family History: Family History  Problem Relation Age of Onset  . Prostate cancer Neg Hx   . Bladder Cancer Neg Hx   . Kidney cancer Neg Hx     Social History   Socioeconomic History  . Marital status: Married    Spouse name: Not on file  . Number of children: Not on file  . Years of education: Not on file  . Highest education level: Not on file  Social Needs  . Financial resource strain: Not on file  . Food insecurity - worry: Not on file  . Food insecurity - inability: Not on file  . Transportation needs - medical: Not on file  . Transportation needs - non-medical: Not on file  Occupational History  . Not on file  Tobacco Use  . Smoking status: Current Every Day Smoker    Packs/day: 1.00    Years: 50.00    Pack years: 50.00  . Smokeless tobacco: Never Used  Substance and Sexual Activity  . Alcohol use: Yes    Comment: occasionally  . Drug use: No  . Sexual activity: Not on  file  Other Topics Concern  . Not on file  Social History Narrative  . Not on file    Review of Systems  Constitutional: Negative for chills, fatigue and unexpected weight change.  HENT: Positive for postnasal drip. Negative for congestion, rhinorrhea, sneezing and sore throat.   Eyes: Negative for redness.  Respiratory: Negative for cough, chest tightness and shortness of breath.   Cardiovascular: Negative for chest pain and palpitations.  Gastrointestinal: Negative for abdominal pain, constipation, diarrhea, nausea and vomiting.  Genitourinary: Negative for dysuria and frequency.  Musculoskeletal: Positive for back pain and myalgias. Negative for arthralgias, joint swelling and neck  pain.  Skin: Negative for rash.  Neurological: Negative.  Negative for tremors and numbness.  Hematological: Negative for adenopathy. Does not bruise/bleed easily.  Psychiatric/Behavioral: Negative for behavioral problems (Depression), sleep disturbance and suicidal ideas. The patient is not nervous/anxious.    Vital Signs: BP (!) 158/76 (BP Location: Left Arm, Patient Position: Sitting)   Pulse 71   Resp 16   Ht '5\' 9"'$  (1.753 m)   Wt 178 lb 12.8 oz (81.1 kg)   SpO2 95%   BMI 26.40 kg/m   Physical Exam  Constitutional: He is oriented to person, place, and time. He appears well-developed and well-nourished. No distress.  HENT:  Head: Normocephalic and atraumatic.  Mouth/Throat: Oropharynx is clear and moist. No oropharyngeal exudate.  Eyes: EOM are normal. Pupils are equal, round, and reactive to light.  Neck: Normal range of motion. Neck supple. No JVD present. No tracheal deviation present. No thyromegaly present.  Cardiovascular: Normal rate, regular rhythm and normal heart sounds. Exam reveals no gallop and no friction rub.  No murmur heard. Pulmonary/Chest: Effort normal. No respiratory distress. He has no wheezes. He has no rales. He exhibits no tenderness.  Abdominal: Soft. Bowel sounds are normal.  Musculoskeletal: Normal range of motion.  Lymphadenopathy:    He has no cervical adenopathy.  Neurological: He is alert and oriented to person, place, and time. No cranial nerve deficit.  Skin: Skin is warm and dry. He is not diaphoretic.  Psychiatric: He has a normal mood and affect. His behavior is normal. Judgment and thought content normal.    Assessment/Plan: 1. Claudication (HCC) - cilostazol (PLETAL) 50 MG tablet; Take 1 tablet (50 mg total) by mouth 2 (two) times daily.  Dispense: 60 tablet; Refill: 3 - TSH - T4, free - DULoxetine (CYMBALTA) 20 MG capsule; Take 1 capsule (20 mg total) by mouth daily.  Dispense: 30 capsule; Refill: 3  2. Acute bronchitis with COPD  ([place]) - albuterol (PROVENTIL HFA;VENTOLIN HFA) 108 (90 Base) MCG/ACT inhaler; Inhale 2 puffs into the lungs every 6 (six) hours as needed for wheezing or shortness of breath.  Dispense: 1 Inhaler; Refill: 0 - BREO ELLIPTA 100-25 MCG/INH AEPB; Inhale 1 puff into the lungs daily.  Dispense: 30 each; Refill: 5 - CBC with Differential/Platelet - Comprehensive metabolic panel  3. Hyperlipidemia, unspecified hyperlipidemia type - Lipid Panel With LDL/HDL Ratio - Comprehensive metabolic panel  4. 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HCC) - meloxicam (MOBIC) 15 MG tablet; Take 1 tablet (15 mg total) by mouth daily.  Dispense: 30 tablet; Refill: 3 - ANA w/Reflex - Uric acid - Rheumatoid Factor - Sed Rate (ESR)  5. Benign hypertension - bisoprolol-hydrochlorothiazide (ZIAC) 2.5-6.25 MG tablet; Take 1 tablet by mouth daily.  Dispense: 30 tablet; Refill: 3 - Urinalysis  General Counseling: Esau verbalizes understanding of the findings of todays visit and agrees with plan of treatment.  I have discussed any further diagnostic evaluation that may be needed or ordered today. We also reviewed his medications today. he has been encouraged to call the office with any questions or concerns that should arise related to todays visit.    Orders Placed This Encounter  Procedures  . CBC with Differential/Platelet  . Lipid Panel With LDL/HDL Ratio  . TSH  . T4, free  . Comprehensive metabolic panel  . Urinalysis    Meds ordered this encounter  Medications  . albuterol (PROVENTIL HFA;VENTOLIN HFA) 108 (90 Base) MCG/ACT inhaler    Sig: Inhale 2 puffs into the lungs every 6 (six) hours as needed for wheezing or shortness of breath.    Dispense:  1 Inhaler    Refill:  0  . BREO ELLIPTA 100-25 MCG/INH AEPB    Sig: Inhale 1 puff into the lungs daily.    Dispense:  30 each    Refill:  5  . meloxicam (MOBIC) 15 MG tablet    Sig: Take 1 tablet (15 mg total) by mouth daily.    Dispense:  30 tablet     Refill:  3  . cilostazol (PLETAL) 50 MG tablet    Sig: Take 1 tablet (50 mg total) by mouth 2 (two) times daily.    Dispense:  60 tablet    Refill:  3  . bisoprolol-hydrochlorothiazide (ZIAC) 2.5-6.25 MG tablet    Sig: Take 1 tablet by mouth daily.    Dispense:  30 tablet    Refill:  3    Time spent:25 Minutes   Dr Lavera Guise Internal medicine

## 2017-10-21 DIAGNOSIS — I739 Peripheral vascular disease, unspecified: Secondary | ICD-10-CM | POA: Diagnosis not present

## 2017-10-21 DIAGNOSIS — J209 Acute bronchitis, unspecified: Secondary | ICD-10-CM | POA: Diagnosis not present

## 2017-10-21 DIAGNOSIS — J440 Chronic obstructive pulmonary disease with acute lower respiratory infection: Secondary | ICD-10-CM | POA: Diagnosis not present

## 2017-10-21 DIAGNOSIS — E785 Hyperlipidemia, unspecified: Secondary | ICD-10-CM | POA: Diagnosis not present

## 2017-10-21 DIAGNOSIS — M461 Sacroiliitis, not elsewhere classified: Secondary | ICD-10-CM | POA: Diagnosis not present

## 2017-10-22 LAB — LIPID PANEL WITH LDL/HDL RATIO
Cholesterol, Total: 231 mg/dL — ABNORMAL HIGH (ref 100–199)
HDL: 36 mg/dL — AB (ref >39)
LDL Calculated: 170 mg/dL — ABNORMAL HIGH (ref 0–99)
LDL/HDL RATIO: 4.7 ratio — AB (ref 0.0–3.6)
TRIGLYCERIDES: 126 mg/dL (ref 0–149)
VLDL Cholesterol Cal: 25 mg/dL (ref 5–40)

## 2017-10-22 LAB — COMPREHENSIVE METABOLIC PANEL
ALK PHOS: 88 IU/L (ref 39–117)
ALT: 8 IU/L (ref 0–44)
AST: 11 IU/L (ref 0–40)
Albumin/Globulin Ratio: 2 (ref 1.2–2.2)
Albumin: 4.1 g/dL (ref 3.5–4.8)
BILIRUBIN TOTAL: 0.2 mg/dL (ref 0.0–1.2)
BUN/Creatinine Ratio: 14 (ref 10–24)
BUN: 16 mg/dL (ref 8–27)
CHLORIDE: 101 mmol/L (ref 96–106)
CO2: 22 mmol/L (ref 20–29)
CREATININE: 1.16 mg/dL (ref 0.76–1.27)
Calcium: 8.9 mg/dL (ref 8.6–10.2)
GFR calc Af Amer: 73 mL/min/{1.73_m2} (ref >59)
GFR calc non Af Amer: 63 mL/min/{1.73_m2} (ref >59)
GLUCOSE: 157 mg/dL — AB (ref 65–99)
Globulin, Total: 2.1 g/dL (ref 1.5–4.5)
Potassium: 4.1 mmol/L (ref 3.5–5.2)
Sodium: 140 mmol/L (ref 134–144)
Total Protein: 6.2 g/dL (ref 6.0–8.5)

## 2017-10-22 LAB — CBC WITH DIFFERENTIAL/PLATELET
Basophils Absolute: 0.1 10*3/uL (ref 0.0–0.2)
Basos: 1 %
EOS (ABSOLUTE): 0.1 10*3/uL (ref 0.0–0.4)
EOS: 1 %
HEMATOCRIT: 49.5 % (ref 37.5–51.0)
Hemoglobin: 17.7 g/dL (ref 13.0–17.7)
IMMATURE GRANULOCYTES: 0 %
Immature Grans (Abs): 0 10*3/uL (ref 0.0–0.1)
LYMPHS ABS: 2.2 10*3/uL (ref 0.7–3.1)
Lymphs: 29 %
MCH: 33 pg (ref 26.6–33.0)
MCHC: 35.8 g/dL — AB (ref 31.5–35.7)
MCV: 92 fL (ref 79–97)
MONOS ABS: 0.4 10*3/uL (ref 0.1–0.9)
Monocytes: 5 %
NEUTROS PCT: 64 %
Neutrophils Absolute: 4.8 10*3/uL (ref 1.4–7.0)
PLATELETS: 180 10*3/uL (ref 150–379)
RBC: 5.36 x10E6/uL (ref 4.14–5.80)
RDW: 13.7 % (ref 12.3–15.4)
WBC: 7.5 10*3/uL (ref 3.4–10.8)

## 2017-10-22 LAB — URIC ACID: Uric Acid: 5.2 mg/dL (ref 3.7–8.6)

## 2017-10-22 LAB — T4, FREE: FREE T4: 1.26 ng/dL (ref 0.82–1.77)

## 2017-10-22 LAB — RHEUMATOID FACTOR: Rhuematoid fact SerPl-aCnc: <10 IU/mL (ref 0.0–13.9)

## 2017-10-22 LAB — ANA W/REFLEX: ANA: NEGATIVE

## 2017-10-22 LAB — TSH: TSH: 1.47 u[IU]/mL (ref 0.450–4.500)

## 2017-10-22 LAB — SEDIMENTATION RATE: Sed Rate: 2 mm/hr (ref 0–30)

## 2017-11-04 DIAGNOSIS — I739 Peripheral vascular disease, unspecified: Secondary | ICD-10-CM | POA: Diagnosis not present

## 2017-11-04 DIAGNOSIS — Z720 Tobacco use: Secondary | ICD-10-CM | POA: Diagnosis not present

## 2017-11-04 DIAGNOSIS — N400 Enlarged prostate without lower urinary tract symptoms: Secondary | ICD-10-CM | POA: Diagnosis not present

## 2017-11-04 DIAGNOSIS — I10 Essential (primary) hypertension: Secondary | ICD-10-CM | POA: Diagnosis not present

## 2017-11-04 DIAGNOSIS — N529 Male erectile dysfunction, unspecified: Secondary | ICD-10-CM | POA: Diagnosis not present

## 2017-11-04 DIAGNOSIS — Z791 Long term (current) use of non-steroidal anti-inflammatories (NSAID): Secondary | ICD-10-CM | POA: Diagnosis not present

## 2017-11-04 DIAGNOSIS — R69 Illness, unspecified: Secondary | ICD-10-CM | POA: Diagnosis not present

## 2017-11-04 DIAGNOSIS — E1151 Type 2 diabetes mellitus with diabetic peripheral angiopathy without gangrene: Secondary | ICD-10-CM | POA: Diagnosis not present

## 2017-11-04 DIAGNOSIS — J449 Chronic obstructive pulmonary disease, unspecified: Secondary | ICD-10-CM | POA: Diagnosis not present

## 2017-11-08 ENCOUNTER — Telehealth: Payer: Self-pay

## 2017-11-08 ENCOUNTER — Other Ambulatory Visit: Payer: Self-pay | Admitting: Internal Medicine

## 2017-11-08 ENCOUNTER — Other Ambulatory Visit: Payer: Self-pay

## 2017-11-08 DIAGNOSIS — J440 Chronic obstructive pulmonary disease with acute lower respiratory infection: Secondary | ICD-10-CM

## 2017-11-08 DIAGNOSIS — J209 Acute bronchitis, unspecified: Secondary | ICD-10-CM

## 2017-11-08 DIAGNOSIS — R69 Illness, unspecified: Secondary | ICD-10-CM | POA: Diagnosis not present

## 2017-11-08 MED ORDER — TIZANIDINE HCL 4 MG PO TABS: ORAL_TABLET | ORAL | 1 refills | Status: DC

## 2017-11-08 NOTE — Telephone Encounter (Signed)
[facility] called requesting refill on albuterol neb solution.  I gave verbal over phone to fill prescription.  #241ml with 5 refills.  dbs

## 2017-11-16 ENCOUNTER — Ambulatory Visit (INDEPENDENT_AMBULATORY_CARE_PROVIDER_SITE_OTHER): Payer: Medicare HMO | Admitting: Internal Medicine

## 2017-11-16 ENCOUNTER — Encounter: Payer: Self-pay | Admitting: Internal Medicine

## 2017-11-16 VITALS — BP 138/62 | HR 55 | Resp 16 | Ht 69.0 in | Wt 174.0 lb

## 2017-11-16 DIAGNOSIS — J449 Chronic obstructive pulmonary disease, unspecified: Secondary | ICD-10-CM | POA: Diagnosis not present

## 2017-11-16 DIAGNOSIS — E1165 Type 2 diabetes mellitus with hyperglycemia: Secondary | ICD-10-CM

## 2017-11-16 DIAGNOSIS — I739 Peripheral vascular disease, unspecified: Secondary | ICD-10-CM | POA: Diagnosis not present

## 2017-11-16 DIAGNOSIS — L989 Disorder of the skin and subcutaneous tissue, unspecified: Secondary | ICD-10-CM | POA: Diagnosis not present

## 2017-11-16 DIAGNOSIS — E785 Hyperlipidemia, unspecified: Secondary | ICD-10-CM | POA: Diagnosis not present

## 2017-11-16 LAB — POCT GLYCOSYLATED HEMOGLOBIN (HGB A1C): Hemoglobin A1C: 5.7

## 2017-11-16 MED ORDER — ATORVASTATIN CALCIUM 10 MG PO TABS: 10.0000 mg | ORAL_TABLET | Freq: Every day | ORAL | 3 refills | Status: DC

## 2017-11-16 MED ORDER — DULOXETINE HCL 30 MG PO CPEP: 30.0000 mg | ORAL_CAPSULE | Freq: Every day | ORAL | 3 refills | Status: DC

## 2017-11-16 NOTE — Progress Notes (Signed)
[facility] [place], [place] [address]  Internal MEDICINE  Office Visit Note  Patient Name: Brian Key  [id number]  [id number]  Date of Service: 11/16/2017  Chief Complaint  Patient presents with  . COPD  . Osteoarthritis  . Hyperlipidemia  . other    COPD  There is no cough or shortness of breath. This is a chronic problem. The current episode started more than 1 year ago. The problem has been gradually improving (pt is on Breo and albuterol ). Associated symptoms include postnasal drip. Pertinent negatives include no chest pain, rhinorrhea, sneezing or sore throat. His past medical history is significant for COPD.  Hyperlipidemia  This is a new problem. This is a new diagnosis. The problem is uncontrolled. Recent lipid tests were reviewed and are high (recent Labs showed abnormal profile, will need therapy ). Pertinent negatives include no chest pain or shortness of breath.  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 The problem has been gradually improving. Pertinent negatives include no abdominal pain, arthralgias, chest pain, chills, congestion, coughing, fatigue, joint swelling, nausea, neck pain, numbness, rash, sore throat or vomiting.    Pt is here for routine follow up for multiple medical problems. He will like to See Dr Fleet Contras for skin tag. He continues to smoke    Current Medication: Outpatient Encounter Medications as of 11/16/2017  Medication Sig  . albuterol (PROVENTIL HFA;VENTOLIN HFA) 108 (90 Base) MCG/ACT inhaler Inhale 2 puffs into the lungs every 6 (six) hours as needed for wheezing or shortness of breath.  Marland Kitchen albuterol (PROVENTIL) (2.5 MG/3ML) 0.083% nebulizer solution Take 2.5 mg by nebulization every 4 (four) hours as needed for wheezing or shortness of breath.  Marland Kitchen atorvastatin (LIPITOR) 10 MG tablet Take 1 tablet (10 mg total) by mouth daily.  .  bisoprolol-hydrochlorothiazide (ZIAC) 2.5-6.25 MG tablet Take 1 tablet by mouth daily.  Marland Kitchen BREO ELLIPTA 100-25 MCG/INH AEPB Inhale 1 puff into the lungs daily.  . cilostazol (PLETAL) 50 MG tablet Take 1 tablet (50 mg total) by mouth 2 (two) times daily.  . DULoxetine (CYMBALTA) 30 MG capsule Take 1 capsule (30 mg total) by mouth daily.  . meloxicam (MOBIC) 15 MG tablet Take 1 tablet (15 mg total) by mouth daily.  . tamsulosin (FLOMAX) 0.4 MG CAPS capsule Take 1 capsule (0.4 mg total) by mouth daily.  Marland Kitchen tiZANidine (ZANAFLEX) 4 MG tablet TAKE 1/2 TO 1 TAB PO AT BEDTIME  IF NEEDED FOR MUSCLE SPASMS  . [DISCONTINUED] DULoxetine (CYMBALTA) 20 MG capsule Take 1 capsule (20 mg total) by mouth daily.   No facility-administered encounter medications on file as of 11/16/2017.     Surgical History: Past Surgical History:  Procedure Laterality Date  . ANKLE SURGERY  2009  . [place]  . HERNIA REPAIR  6283   umbilical  . NECK SURGERY    . WRIST SURGERY Left 2007    Medical History: Past Medical History:  Diagnosis Date  . Back problem   . COPD (chronic obstructive pulmonary disease) (HCC)     Family History: Family History  Problem Relation Age of Onset  . Prostate cancer Neg Hx   . Bladder Cancer Neg Hx   . Kidney cancer Neg Hx     Social History   Socioeconomic History  . Marital status: Married    Spouse name: Not on file  . Number of children: Not on  file  . Years of education: Not on file  . Highest education level: Not on file  Occupational History  . Not on file  Social Needs  . Financial resource strain: Not on file  . Food insecurity:    Worry: Not on file    Inability: Not on file  . Transportation needs:    Medical: Not on file    Non-medical: Not on file  Tobacco Use  . Smoking status: Current Every Day Smoker    Packs/day: 1.00    Years: 50.00    Pack years: 50.00  . Smokeless tobacco: Never Used  Substance and Sexual Activity  . Alcohol  use: Yes    Comment: occasionally  . Drug use: No  . Sexual activity: Not on file  Lifestyle  . Physical activity:    Days per week: Not on file    Minutes per session: Not on file  . Stress: Not on file  Relationships  . Social connections:    Talks on phone: Not on file    Gets together: Not on file    Attends religious service: Not on file    Active member of club or organization: Not on file    Attends meetings of clubs or organizations: Not on file    Relationship status: Not on file  . Intimate partner violence:    Fear of current or ex partner: Not on file    Emotionally abused: Not on file    Physically abused: Not on file    Forced sexual activity: Not on file  Other Topics Concern  . Not on file  Social History Narrative  . Not on file      Review of Systems  Constitutional: Negative for chills, fatigue and unexpected weight change.  HENT: Positive for postnasal drip. Negative for congestion, rhinorrhea, sneezing and sore throat.   Eyes: Negative for redness.  Respiratory: Negative for cough, chest tightness and shortness of breath.   Cardiovascular: Negative for chest pain and palpitations.  Gastrointestinal: Negative for abdominal pain, constipation, diarrhea, nausea and vomiting.  Genitourinary: Negative for dysuria and frequency.  Musculoskeletal: Negative for arthralgias, back pain, joint swelling and neck pain.  Skin: Negative for rash.  Neurological: Negative.  Negative for tremors and numbness.  Hematological: Negative for adenopathy. Does not bruise/bleed easily.  Psychiatric/Behavioral: Negative for behavioral problems (Depression), sleep disturbance and suicidal ideas. The patient is not nervous/anxious.     Vital Signs: BP 138/62 (BP Location: Right Arm, Patient Position: Sitting, Cuff Size: Normal)   Pulse (!) 55   Resp 16   Ht 5\' 9"  (1.753 m)   Wt 174 lb (78.9 kg)   SpO2 97%   BMI 25.70 kg/m    Physical Exam  Constitutional: He is  oriented to person, place, and time. He appears well-developed and well-nourished. No distress.  HENT:  Head: Normocephalic and atraumatic.  Mouth/Throat: Oropharynx is clear and moist. No oropharyngeal exudate.  Eyes: Pupils are equal, round, and reactive to light. EOM are normal.  Neck: Normal range of motion. Neck supple. No JVD present. No tracheal deviation present. No thyromegaly present.  Cardiovascular: Normal rate, regular rhythm and normal heart sounds. Exam reveals no gallop and no friction rub.  No murmur heard. Pulmonary/Chest: Effort normal. No respiratory distress. He has no wheezes. He has no rales. He exhibits no tenderness.  Abdominal: Soft. Bowel sounds are normal.  Musculoskeletal: Normal range of motion.  Lymphadenopathy:    He has no cervical adenopathy.  Neurological: He is alert and oriented to person, place, and time. No cranial nerve deficit.  Skin: Skin is warm and dry. He is not diaphoretic.  Psychiatric: He has a normal mood and affect. His behavior is normal. Judgment and thought content normal.   Assessment/Plan: 1. Chronic obstructive pulmonary disease, unspecified COPD type ([place]) - Controlled with Breo and proventil, will need PFT's on next visit  2. Claudication (HCC) - Increase  DULoxetine (CYMBALTA) 30 MG capsule; Take 1 capsule (30 mg total) by mouth daily.  Dispense: 90 capsule; Refill: 3 - Encourage smoking cessation  3. Uncontrolled type 2 diabetes mellitus with hyperglycemia (HCC) - POCT HgB A1C  4. Skin lesion on examination - Ambulatory referral to General Surgery  5. Hyperlipidemia, unspecified hyperlipidemia type - Start  atorvastatin (LIPITOR) 10 MG tablet; Take 1 tablet (10 mg total) by mouth daily.  Dispense: 90 tablet; Refill: 3  General Counseling: Byard verbalizes understanding of the findings of todays visit and agrees with plan of treatment. I have discussed any further diagnostic evaluation that may be needed or ordered today.  We also reviewed his medications today. he has been encouraged to call the office with any questions or concerns that should arise related to todays visit.   Orders Placed This Encounter  Procedures  . Ambulatory referral to General Surgery  . POCT HgB A1C    Meds ordered this encounter  Medications  . DULoxetine (CYMBALTA) 30 MG capsule    Sig: Take 1 capsule (30 mg total) by mouth daily.    Dispense:  90 capsule    Refill:  3  . atorvastatin (LIPITOR) 10 MG tablet    Sig: Take 1 tablet (10 mg total) by mouth daily.    Dispense:  90 tablet    Refill:  3    Time spent:25 Minutes  Dr Lavera Guise Internal medicine

## 2017-12-14 ENCOUNTER — Ambulatory Visit: Payer: Medicare Other | Admitting: General Surgery

## 2017-12-14 ENCOUNTER — Encounter: Payer: Self-pay | Admitting: General Surgery

## 2017-12-14 VITALS — BP 136/60 | HR 66 | Resp 18 | Ht 69.0 in | Wt 174.0 lb

## 2017-12-14 DIAGNOSIS — L989 Disorder of the skin and subcutaneous tissue, unspecified: Secondary | ICD-10-CM

## 2017-12-14 NOTE — Patient Instructions (Addendum)
The patient is aware to call back for any questions or new concerns.   office excision skin lesion left groin

## 2017-12-14 NOTE — Progress Notes (Signed)
Patient ID: Brian Key, male   DOB: December 01, 1945, 72 y.o.   MRN: [id number]  Chief Complaint  Patient presents with  . Skin Problem    HPI Brian Key is a 72 y.o. male.  Here today for evaluation of a skin tag referred by Dr Clayborn Bigness. He states it is left groin that has been there for 10 years. He states he has had it removed bv 2 dermatologist and it still comes back. He states a crust forms and then peels off. He thinks it is about dime size and has been the same size. No pain.  HPI  Past Medical History:  Diagnosis Date  . Back problem   . COPD (chronic obstructive pulmonary disease) ([place])     Past Surgical History:  Procedure Laterality Date  . ANKLE SURGERY  2009  . [place]  . HERNIA REPAIR  4401   umbilical  . LIPOMA EXCISION  02/16/2017   back/ Dr Bary Castilla  . NECK SURGERY    . WRIST SURGERY Left 2007    Family History  Problem Relation Age of Onset  . Prostate cancer Neg Hx   . Bladder Cancer Neg Hx   . Kidney cancer Neg Hx     Social History Social History   Tobacco Use  . Smoking status: Current Every Day Smoker    Packs/day: 1.00    Years: 50.00    Pack years: 50.00  . Smokeless tobacco: Never Used  Substance Use Topics  . Alcohol use: Yes    Comment: occasionally  . Drug use: No    Allergies  Allergen Reactions  . Bee Pollen Anaphylaxis  . Penicillins Rash    Other reaction(s): Unknown    Current Outpatient Medications  Medication Sig Dispense Refill  . albuterol (PROVENTIL HFA;VENTOLIN HFA) 108 (90 Base) MCG/ACT inhaler Inhale 2 puffs into the lungs every 6 (six) hours as needed for wheezing or shortness of breath. 1 Inhaler 0  . albuterol (PROVENTIL) (2.5 MG/3ML) 0.083% nebulizer solution Take 2.5 mg by nebulization every 4 (four) hours as needed for wheezing or shortness of breath.    Marland Kitchen atorvastatin (LIPITOR) 10 MG tablet Take 1 tablet (10 mg total) by mouth daily. 90 tablet 3  . bisoprolol-hydrochlorothiazide  (ZIAC) 2.5-6.25 MG tablet Take 1 tablet by mouth daily. 30 tablet 3  . BREO ELLIPTA 100-25 MCG/INH AEPB Inhale 1 puff into the lungs daily. 30 each 5  . cilostazol (PLETAL) 50 MG tablet Take 1 tablet (50 mg total) by mouth 2 (two) times daily. 60 tablet 3  . meloxicam (MOBIC) 15 MG tablet Take 1 tablet (15 mg total) by mouth daily. 30 tablet 3  . tamsulosin (FLOMAX) 0.4 MG CAPS capsule Take 1 capsule (0.4 mg total) by mouth daily. 30 capsule 11   No current facility-administered medications for this visit.     Review of Systems Review of Systems  Constitutional: Negative.   Respiratory: Negative.   Cardiovascular: Negative.     Blood pressure 136/60, pulse 66, resp. rate 18, height 5\' 9"  (1.753 m), weight 174 lb (78.9 kg), SpO2 97 %.  Physical Exam Physical Exam  Constitutional: He is oriented to person, place, and time. He appears well-developed and well-nourished.  HENT:  Mouth/Throat: Oropharynx is clear and moist.  Eyes: Conjunctivae are normal. No scleral icterus.  Neck: Neck supple.  Cardiovascular: Normal rate, regular rhythm and normal heart sounds.  Pulmonary/Chest: Effort normal and breath sounds normal.  Genitourinary:  Lymphadenopathy:    He has no cervical adenopathy.  Neurological: He is alert and oriented to person, place, and time.  Skin: Skin is warm and dry.  Skin lesion 7 mm left groin  Psychiatric: His behavior is normal.       Assessment    Focal skin irritation.     Plan    Recommend office excision skin lesion left groin.     HPI, Physical Exam, Assessment and Plan have been scribed under the direction and in the presence of Robert Bellow, MD. Karie Fetch, RN  I have completed the exam and reviewed the above documentation for accuracy and completeness.  I agree with the above.  Haematologist has been used and any errors in dictation or transcription are unintentional.  Hervey Ard, M.D., F.A.C.S.   Forest Gleason  [place] 12/14/2017, 9:37 PM

## 2017-12-15 DIAGNOSIS — R69 Illness, unspecified: Secondary | ICD-10-CM | POA: Diagnosis not present

## 2018-01-11 ENCOUNTER — Encounter: Payer: Self-pay | Admitting: General Surgery

## 2018-01-11 ENCOUNTER — Ambulatory Visit: Payer: Medicare HMO | Admitting: General Surgery

## 2018-01-11 VITALS — BP 134/60 | HR 60 | Resp 16 | Ht 69.0 in | Wt 173.0 lb

## 2018-01-11 DIAGNOSIS — D214 Benign neoplasm of connective and other soft tissue of abdomen: Secondary | ICD-10-CM

## 2018-01-11 DIAGNOSIS — R229 Localized swelling, mass and lump, unspecified: Secondary | ICD-10-CM | POA: Insufficient documentation

## 2018-01-11 DIAGNOSIS — L821 Other seborrheic keratosis: Secondary | ICD-10-CM | POA: Diagnosis not present

## 2018-01-11 NOTE — Progress Notes (Signed)
Patient ID: Brian Key, male   DOB: 1946-04-23, 72 y.o.   MRN: [id number]  Chief Complaint  Patient presents with  . Procedure    HPI Brian Key is a 72 y.o. male here today for a excision left groin lesion.  He is here with his wife, Brian Key.  HPI  Past Medical History:  Diagnosis Date  . Back problem   . COPD (chronic obstructive pulmonary disease) ([place])     Past Surgical History:  Procedure Laterality Date  . ANKLE SURGERY  2009  . [place]  . HERNIA REPAIR  [id number]   umbilical  . LIPOMA EXCISION  02/16/2017   back/ Dr Bary Castilla  . NECK SURGERY    . WRIST SURGERY Left 2007    Family History  Problem Relation Age of Onset  . Prostate cancer Neg Hx   . Bladder Cancer Neg Hx   . Kidney cancer Neg Hx     Social History Social History   Tobacco Use  . Smoking status: Current Every Day Smoker    Packs/day: 1.00    Years: 50.00    Pack years: 50.00  . Smokeless tobacco: Never Used  Substance Use Topics  . Alcohol use: Yes    Comment: occasionally  . Drug use: No    Allergies  Allergen Reactions  . Bee Pollen Anaphylaxis  . Penicillins Rash    Other reaction(s): Unknown    Current Outpatient Medications  Medication Sig Dispense Refill  . albuterol (PROVENTIL HFA;VENTOLIN HFA) 108 (90 Base) MCG/ACT inhaler Inhale 2 puffs into the lungs every 6 (six) hours as needed for wheezing or shortness of breath. 1 Inhaler 0  . albuterol (PROVENTIL) (2.5 MG/3ML) 0.083% nebulizer solution Take 2.5 mg by nebulization every 4 (four) hours as needed for wheezing or shortness of breath.    Marland Kitchen atorvastatin (LIPITOR) 10 MG tablet Take 1 tablet (10 mg total) by mouth daily. 90 tablet 3  . bisoprolol-hydrochlorothiazide (ZIAC) 2.5-6.25 MG tablet Take 1 tablet by mouth daily. 30 tablet 3  . BREO ELLIPTA 100-25 MCG/INH AEPB Inhale 1 puff into the lungs daily. 30 each 5  . cilostazol (PLETAL) 50 MG tablet Take 1 tablet (50 mg total) by mouth 2 (two) times  daily. 60 tablet 3  . meloxicam (MOBIC) 15 MG tablet Take 1 tablet (15 mg total) by mouth daily. 30 tablet 3  . tamsulosin (FLOMAX) 0.4 MG CAPS capsule Take 1 capsule (0.4 mg total) by mouth daily. 30 capsule 11   No current facility-administered medications for this visit.     Review of Systems Review of Systems  Constitutional: Negative.   Respiratory: Negative.   Cardiovascular: Negative.     Blood pressure 134/60, pulse 60, resp. rate 16, height 5\' 9"  (1.753 m), weight 173 lb (78.5 kg), SpO2 95 %.  Physical Exam Physical Exam  Constitutional: He is oriented to person, place, and time. He appears well-developed and well-nourished.  Neurological: He is alert and oriented to person, place, and time.  Skin: Skin is warm and dry.  Psychiatric: His behavior is normal.    Data Reviewed The procedure for excision was reviewed and the patient was amenable to proceed.  The area in the left inguinal crural fold was cleansed with alcohol followed by 10 cc of 0.5% Xylocaine with 0.25% Marcaine with 1 to 200,000 units of epinephrine.  ChloraPrep was applied to the skin.  The lesion was excised to elliptical incision with a suture placed at the medial  edge for orientation.  This was sent in formalin for routine histology.  The wound was closed with a running 4-0 Vicryl subarticular suture.  Benzoin, Steri-Strips, Telfa and Tegaderm dressing applied.  Ice pack provided.  Assessment    Symptomatic seborrheic keratosis involving the left inguinal crease.    Plan The patient was asked to call if he has any difficulty with wound healing. Follow-up otherwise will be on an as-needed basis.    HPI, Physical Exam, Assessment and Plan have been scribed under the direction and in the presence of Brian Bellow, MD. Brian Fetch, RN  I have completed the exam and reviewed the above documentation for accuracy and completeness.  I agree with the above.  Haematologist has been used and any  errors in dictation or transcription are unintentional.  Brian Key, M.D., F.A.C.S.  Forest Gleason Brian Key 01/11/2018, 8:40 PM

## 2018-01-11 NOTE — Patient Instructions (Signed)
May shower May remove dressing in 2-3 days Steri strips will gradually come off over 2-3 weeks May use an Ice pack as needed for comfort  

## 2018-01-14 ENCOUNTER — Telehealth: Payer: Self-pay | Admitting: *Deleted

## 2018-01-14 NOTE — Telephone Encounter (Signed)
-----   Message from Robert Bellow, MD sent at 01/13/2018  7:59 PM EDT ----- Please notify the patient that the skin nodule removed earlier this week was benign. ----- Message ----- From: Interface, Lab In [facility] Sent: 01/13/2018   4:04 PM To: Robert Bellow, MD

## 2018-01-14 NOTE — Telephone Encounter (Signed)
Notified patient as instructed, patient agrees.  

## 2018-01-24 ENCOUNTER — Other Ambulatory Visit: Payer: Self-pay | Admitting: Internal Medicine

## 2018-01-24 DIAGNOSIS — R69 Illness, unspecified: Secondary | ICD-10-CM | POA: Diagnosis not present

## 2018-01-24 DIAGNOSIS — I10 Essential (primary) hypertension: Secondary | ICD-10-CM

## 2018-02-22 ENCOUNTER — Other Ambulatory Visit: Payer: Self-pay | Admitting: Family Medicine

## 2018-02-22 MED ORDER — TAMSULOSIN HCL 0.4 MG PO CAPS: 0.4000 mg | ORAL_CAPSULE | Freq: Every day | ORAL | 1 refills | Status: DC

## 2018-02-23 ENCOUNTER — Telehealth: Payer: Self-pay | Admitting: Internal Medicine

## 2018-02-23 DIAGNOSIS — R69 Illness, unspecified: Secondary | ICD-10-CM | POA: Diagnosis not present

## 2018-02-23 NOTE — Telephone Encounter (Signed)
BOTH PATIENTS APPTS WERE CANCELED.

## 2018-02-23 NOTE — Telephone Encounter (Signed)
Patient called and states that him and his wife Enid Derry are in [place] for family business , not sure when they will be back in town, but he is needing to cancel his and wifes appointment at this time and also wants to let Dr Humphrey Rolls know that he did stop the blood thinner medication because he could not stand the side effect from it . Patient stated that it was giving him diarrhea and that it can be discussed at the next office visit . He will call when he returns from [place].

## 2018-03-16 ENCOUNTER — Ambulatory Visit: Payer: Self-pay

## 2018-04-04 DIAGNOSIS — R69 Illness, unspecified: Secondary | ICD-10-CM | POA: Diagnosis not present

## 2018-04-05 ENCOUNTER — Other Ambulatory Visit: Payer: Self-pay

## 2018-04-05 DIAGNOSIS — M461 Sacroiliitis, not elsewhere classified: Secondary | ICD-10-CM

## 2018-04-05 MED ORDER — MELOXICAM 15 MG PO TABS: 15.0000 mg | ORAL_TABLET | Freq: Every day | ORAL | 1 refills | Status: DC

## 2018-04-21 ENCOUNTER — Ambulatory Visit: Payer: Medicare Other | Admitting: Urology

## 2018-05-26 ENCOUNTER — Other Ambulatory Visit: Payer: Self-pay | Admitting: Family Medicine

## 2018-05-26 MED ORDER — TAMSULOSIN HCL 0.4 MG PO CAPS: 0.4000 mg | ORAL_CAPSULE | Freq: Every day | ORAL | 0 refills | Status: DC

## 2018-06-09 ENCOUNTER — Telehealth: Payer: Self-pay | Admitting: Internal Medicine

## 2018-06-09 NOTE — Telephone Encounter (Signed)
MA[place]ED [address] [address].JW

## 2018-06-10 ENCOUNTER — Other Ambulatory Visit: Payer: Self-pay | Admitting: Internal Medicine

## 2018-06-10 DIAGNOSIS — J440 Chronic obstructive pulmonary disease with acute lower respiratory infection: Secondary | ICD-10-CM

## 2018-06-10 DIAGNOSIS — J209 Acute bronchitis, unspecified: Secondary | ICD-10-CM

## 2018-07-19 DIAGNOSIS — R69 Illness, unspecified: Secondary | ICD-10-CM | POA: Diagnosis not present

## 2018-08-15 ENCOUNTER — Other Ambulatory Visit: Payer: Self-pay

## 2018-08-15 MED ORDER — TAMSULOSIN HCL 0.4 MG PO CAPS: 0.4000 mg | ORAL_CAPSULE | Freq: Every day | ORAL | 0 refills | Status: DC

## 2018-08-15 NOTE — Telephone Encounter (Signed)
Pt called that his urologist is retired and he is in [place] and he will be back in may as per heather ok to send tamusulosin

## 2018-08-25 DIAGNOSIS — Z789 Other specified health status: Secondary | ICD-10-CM | POA: Diagnosis not present

## 2018-08-25 DIAGNOSIS — L218 Other seborrheic dermatitis: Secondary | ICD-10-CM | POA: Diagnosis not present

## 2018-08-25 DIAGNOSIS — L570 Actinic keratosis: Secondary | ICD-10-CM | POA: Diagnosis not present

## 2018-08-25 DIAGNOSIS — B078 Other viral warts: Secondary | ICD-10-CM | POA: Diagnosis not present

## 2018-08-25 DIAGNOSIS — L538 Other specified erythematous conditions: Secondary | ICD-10-CM | POA: Diagnosis not present

## 2018-08-25 DIAGNOSIS — L578 Other skin changes due to chronic exposure to nonionizing radiation: Secondary | ICD-10-CM | POA: Diagnosis not present

## 2018-10-07 ENCOUNTER — Other Ambulatory Visit: Payer: Self-pay | Admitting: Internal Medicine

## 2018-10-07 DIAGNOSIS — J440 Chronic obstructive pulmonary disease with acute lower respiratory infection: Secondary | ICD-10-CM

## 2018-10-07 DIAGNOSIS — J209 Acute bronchitis, unspecified: Secondary | ICD-10-CM

## 2018-10-10 ENCOUNTER — Other Ambulatory Visit: Payer: Self-pay

## 2018-10-10 DIAGNOSIS — J440 Chronic obstructive pulmonary disease with acute lower respiratory infection: Secondary | ICD-10-CM

## 2018-10-10 DIAGNOSIS — J209 Acute bronchitis, unspecified: Secondary | ICD-10-CM

## 2018-10-10 DIAGNOSIS — M461 Sacroiliitis, not elsewhere classified: Secondary | ICD-10-CM

## 2018-10-10 MED ORDER — ALBUTEROL SULFATE (2.5 MG/3ML) 0.083% IN NEBU: 2.5000 mg | INHALATION_SOLUTION | RESPIRATORY_TRACT | 2 refills | Status: DC | PRN

## 2018-10-10 MED ORDER — FLUTICASONE FUROATE-VILANTEROL 100-25 MCG/INH IN AEPB: INHALATION_SPRAY | RESPIRATORY_TRACT | 2 refills | Status: DC

## 2018-10-10 MED ORDER — MELOXICAM 15 MG PO TABS: 15.0000 mg | ORAL_TABLET | Freq: Every day | ORAL | 2 refills | Status: DC

## 2018-10-10 MED ORDER — TAMSULOSIN HCL 0.4 MG PO CAPS: 0.4000 mg | ORAL_CAPSULE | Freq: Every day | ORAL | 2 refills | Status: DC

## 2018-10-10 NOTE — Telephone Encounter (Signed)
Spoke with dr Humphrey Rolls that pt is unable to come in until may he is out of town as per dr Humphrey Rolls send med for 3 month and also advised pt need to been seen in may

## 2018-10-12 DIAGNOSIS — R69 Illness, unspecified: Secondary | ICD-10-CM | POA: Diagnosis not present

## 2018-10-21 ENCOUNTER — Telehealth: Payer: Self-pay

## 2018-10-21 ENCOUNTER — Other Ambulatory Visit: Payer: Self-pay

## 2018-10-21 NOTE — Telephone Encounter (Signed)
Pt advised we send all his med to local phar he need to contact to local [facility] and transfer to [employer] we unable send to different state

## 2019-01-30 ENCOUNTER — Encounter: Payer: Self-pay | Admitting: Adult Health

## 2019-01-30 ENCOUNTER — Other Ambulatory Visit: Payer: Self-pay

## 2019-01-30 ENCOUNTER — Ambulatory Visit (INDEPENDENT_AMBULATORY_CARE_PROVIDER_SITE_OTHER): Payer: Medicare HMO | Admitting: Adult Health

## 2019-01-30 VITALS — BP 122/70 | HR 75 | Resp 16 | Ht 69.0 in | Wt 172.0 lb

## 2019-01-30 DIAGNOSIS — N400 Enlarged prostate without lower urinary tract symptoms: Secondary | ICD-10-CM | POA: Diagnosis not present

## 2019-01-30 DIAGNOSIS — J440 Chronic obstructive pulmonary disease with acute lower respiratory infection: Secondary | ICD-10-CM

## 2019-01-30 DIAGNOSIS — R300 Dysuria: Secondary | ICD-10-CM

## 2019-01-30 DIAGNOSIS — E785 Hyperlipidemia, unspecified: Secondary | ICD-10-CM

## 2019-01-30 DIAGNOSIS — Z0001 Encounter for general adult medical examination with abnormal findings: Secondary | ICD-10-CM | POA: Diagnosis not present

## 2019-01-30 DIAGNOSIS — M461 Sacroiliitis, not elsewhere classified: Secondary | ICD-10-CM

## 2019-01-30 DIAGNOSIS — M79672 Pain in left foot: Secondary | ICD-10-CM

## 2019-01-30 DIAGNOSIS — Z125 Encounter for screening for malignant neoplasm of prostate: Secondary | ICD-10-CM

## 2019-01-30 DIAGNOSIS — J449 Chronic obstructive pulmonary disease, unspecified: Secondary | ICD-10-CM

## 2019-01-30 DIAGNOSIS — M79671 Pain in right foot: Secondary | ICD-10-CM

## 2019-01-30 DIAGNOSIS — J209 Acute bronchitis, unspecified: Secondary | ICD-10-CM

## 2019-01-30 DIAGNOSIS — I739 Peripheral vascular disease, unspecified: Secondary | ICD-10-CM

## 2019-01-30 MED ORDER — MELOXICAM 15 MG PO TABS: 15.0000 mg | ORAL_TABLET | Freq: Every day | ORAL | 2 refills | Status: DC

## 2019-01-30 MED ORDER — ALBUTEROL SULFATE HFA 108 (90 BASE) MCG/ACT IN AERS: 2.0000 | INHALATION_SPRAY | Freq: Four times a day (QID) | RESPIRATORY_TRACT | 2 refills | Status: DC | PRN

## 2019-01-30 MED ORDER — TAMSULOSIN HCL 0.4 MG PO CAPS: 0.4000 mg | ORAL_CAPSULE | Freq: Every day | ORAL | 3 refills | Status: DC

## 2019-01-30 MED ORDER — ALBUTEROL SULFATE (2.5 MG/3ML) 0.083% IN NEBU: 2.5000 mg | INHALATION_SOLUTION | RESPIRATORY_TRACT | 3 refills | Status: DC | PRN

## 2019-01-30 MED ORDER — CILOSTAZOL 50 MG PO TABS: 50.0000 mg | ORAL_TABLET | Freq: Two times a day (BID) | ORAL | 2 refills | Status: DC

## 2019-01-30 MED ORDER — FLUTICASONE FUROATE-VILANTEROL 100-25 MCG/INH IN AEPB: INHALATION_SPRAY | RESPIRATORY_TRACT | 3 refills | Status: DC

## 2019-01-30 NOTE — Patient Instructions (Signed)
Chronic Obstructive Pulmonary Disease Chronic obstructive pulmonary disease (COPD) is a long-term (chronic) lung problem. When you have COPD, it is hard for air to get in and out of your lungs. Usually the condition gets worse over time, and your lungs will never return to normal. There are things you can do to keep yourself as healthy as possible.  Your doctor may treat your condition with: ? Medicines. ? Oxygen. ? Lung surgery.  Your doctor may also recommend: ? Rehabilitation. This includes steps to make your body work better. It may involve a team of specialists. ? Quitting smoking, if you smoke. ? Exercise and changes to your diet. ? Comfort measures (palliative care). Follow these instructions at home: Medicines  Take over-the-counter and prescription medicines only as told by your doctor.  Talk to your doctor before taking any cough or allergy medicines. You may need to avoid medicines that cause your lungs to be dry. Lifestyle  If you smoke, stop. Smoking makes the problem worse. If you need help quitting, ask your doctor.  Avoid being around things that make your breathing worse. This may include smoke, chemicals, and fumes.  Stay active, but remember to rest as well.  Learn and use tips on how to relax.  Make sure you get enough sleep. Most adults need at least 7 hours of sleep every night.  Eat healthy foods. Eat smaller meals more often. Rest before meals. Controlled breathing Learn and use tips on how to control your breathing as told by your doctor. Try:  Breathing in (inhaling) through your nose for 1 second. Then, pucker your lips and breath out (exhale) through your lips for 2 seconds.  Putting one hand on your belly (abdomen). Breathe in slowly through your nose for 1 second. Your hand on your belly should move out. Pucker your lips and breathe out slowly through your lips. Your hand on your belly should move in as you breathe out.  Controlled coughing Learn  and use controlled coughing to clear mucus from your lungs. Follow these steps: 1. Lean your head a little forward. 2. Breathe in deeply. 3. Try to hold your breath for 3 seconds. 4. Keep your mouth slightly open while coughing 2 times. 5. Spit any mucus out into a tissue. 6. Rest and do the steps again 1 or 2 times as needed. General instructions  Make sure you get all the shots (vaccines) that your doctor recommends. Ask your doctor about a flu shot and a pneumonia shot.  Use oxygen therapy and pulmonary rehabilitation if told by your doctor. If you need home oxygen therapy, ask your doctor if you should buy a tool to measure your oxygen level (oximeter).  Make a COPD action plan with your doctor. This helps you to know what to do if you feel worse than usual.  Manage any other conditions you have as told by your doctor.  Avoid going outside when it is very hot, cold, or humid.  Avoid people who have a sickness you can catch (contagious).  Keep all follow-up visits as told by your doctor. This is important. Contact a doctor if:  You cough up more mucus than usual.  There is a change in the color or thickness of the mucus.  It is harder to breathe than usual.  Your breathing is faster than usual.  You have trouble sleeping.  You need to use your medicines more often than usual.  You have trouble doing your normal activities such as getting dressed   or walking around the house. Get help right away if:  You have shortness of breath while resting.  You have shortness of breath that stops you from: ? Being able to talk. ? Doing normal activities.  Your chest hurts for longer than 5 minutes.  Your skin color is more blue than usual.  Your pulse oximeter shows that you have low oxygen for longer than 5 minutes.  You have a fever.  You feel too tired to breathe normally. Summary  Chronic obstructive pulmonary disease (COPD) is a long-term lung problem.  The way your  lungs work will never return to normal. Usually the condition gets worse over time. There are things you can do to keep yourself as healthy as possible.  Take over-the-counter and prescription medicines only as told by your doctor.  If you smoke, stop. Smoking makes the problem worse. This information is not intended to replace advice given to you by your health care provider. Make sure you discuss any questions you have with your health care provider. Document Released: 01/27/2008 Document Revised: 09/14/2016 Document Reviewed: 09/14/2016 Elsevier Interactive Patient Education  2019 Elsevier Inc.  

## 2019-01-30 NOTE — Progress Notes (Signed)
[facility] [place], [place] [address]  Internal MEDICINE  Office Visit Note  Patient Name: Brian Key  [id number]  [id number]  Date of Service: 01/30/2019  Chief Complaint  Patient presents with  . Medical Management of Chronic Issues  . Annual Exam    medicare annual exam   . COPD  . Hyperlipidemia  . Leg Pain    bilateral leg pain while walking , pain that starts that the feet and works up, would like a referral to podiatrist and urologist .      HPI Pt is here for routine health maintenance examination.  Pt has a history of COPD, hyperlipidemia.  Overall patient does not have any complaints.  He does require multiple refills at this visit for his COPD medications.  He reports good relief with these occasions.  He is complaining however some bilateral leg pain when walking.  He describes his pain that begins in his feet and radiates up his legs.  He is requesting a referral to podiatry for this.  Patient also has a history of BPH has not seen a urologist in quite some time.  He is requesting a referral so he may establish care.  That referral will be provided today.  Patient reports he is currently on pack per day cigarette smoker and he drinks alcohol occasionally.  He also reports using marijuana every other day.   Current Medication: Outpatient Encounter Medications as of 01/30/2019  Medication Sig  . albuterol (PROVENTIL) (2.5 MG/3ML) 0.083% nebulizer solution Take 3 mLs (2.5 mg total) by nebulization every 4 (four) hours as needed for wheezing or shortness of breath.  . fluticasone furoate-vilanterol (BREO ELLIPTA) 100-25 MCG/INH AEPB INHALE 1 PUFF BY MOUTH ONCE DAILY  . meloxicam (MOBIC) 15 MG tablet Take 1 tablet (15 mg total) by mouth daily.  . tamsulosin (FLOMAX) 0.4 MG CAPS capsule Take 1 capsule (0.4 mg total) by mouth daily.  . [DISCONTINUED] albuterol (PROVENTIL) (2.5 MG/3ML) 0.083% nebulizer solution Take 3 mLs (2.5 mg total) by  nebulization every 4 (four) hours as needed for wheezing or shortness of breath.  . [DISCONTINUED] fluticasone furoate-vilanterol (BREO ELLIPTA) 100-25 MCG/INH AEPB INHALE 1 PUFF BY MOUTH ONCE DAILY  . [DISCONTINUED] meloxicam (MOBIC) 15 MG tablet Take 1 tablet (15 mg total) by mouth daily.  . [DISCONTINUED] tamsulosin (FLOMAX) 0.4 MG CAPS capsule Take 1 capsule (0.4 mg total) by mouth daily.  Marland Kitchen albuterol (VENTOLIN HFA) 108 (90 Base) MCG/ACT inhaler Inhale 2 puffs into the lungs every 6 (six) hours as needed for wheezing or shortness of breath.  . cilostazol (PLETAL) 50 MG tablet Take 1 tablet (50 mg total) by mouth 2 (two) times daily.  . [DISCONTINUED] albuterol (PROVENTIL HFA;VENTOLIN HFA) 108 (90 Base) MCG/ACT inhaler Inhale 2 puffs into the lungs every 6 (six) hours as needed for wheezing or shortness of breath. (Patient not taking: Reported on 01/30/2019)  . [DISCONTINUED] atorvastatin (LIPITOR) 10 MG tablet Take 1 tablet (10 mg total) by mouth daily. (Patient not taking: Reported on 01/30/2019)  . [DISCONTINUED] bisoprolol-hydrochlorothiazide (ZIAC) 2.5-6.25 MG tablet TAKE 1 TABLET BY MOUTH ONCE DAILY (Patient not taking: Reported on 01/30/2019)  . [DISCONTINUED] cilostazol (PLETAL) 50 MG tablet Take 1 tablet (50 mg total) by mouth 2 (two) times daily. (Patient not taking: Reported on 01/30/2019)   No facility-administered encounter medications on file as of 01/30/2019.     Surgical History: Past Surgical History:  Procedure Laterality Date  . ANKLE SURGERY  2009  .  [place]  . HERNIA REPAIR  [id number]   umbilical  . LIPOMA EXCISION  02/16/2017   back/ Dr Bary Castilla  . NECK SURGERY    . WRIST SURGERY Left 2007    Medical History: Past Medical History:  Diagnosis Date  . Back problem   . COPD (chronic obstructive pulmonary disease) ([place])   . Hyperlipidemia     Family History: Family History  Problem Relation Age of Onset  . Prostate cancer Neg Hx   . Bladder Cancer Neg Hx    . Kidney cancer Neg Hx       Review of Systems  Constitutional: Negative.  Negative for chills, fatigue and unexpected weight change.  HENT: Negative.  Negative for congestion, rhinorrhea, sneezing and sore throat.   Eyes: Negative for redness.  Respiratory: Negative.  Negative for cough, chest tightness and shortness of breath.   Cardiovascular: Negative.  Negative for chest pain and palpitations.  Gastrointestinal: Negative.  Negative for abdominal pain, constipation, diarrhea, nausea and vomiting.  Endocrine: Negative.   Genitourinary: Negative.  Negative for dysuria and frequency.  Musculoskeletal: Negative.  Negative for arthralgias, back pain, joint swelling and neck pain.  Skin: Negative.  Negative for rash.  Allergic/Immunologic: Negative.   Neurological: Negative.  Negative for tremors and numbness.  Hematological: Negative for adenopathy. Does not bruise/bleed easily.  Psychiatric/Behavioral: Negative.  Negative for behavioral problems, sleep disturbance and suicidal ideas. The patient is not nervous/anxious.      Vital Signs: BP 122/70   Pulse 75   Resp 16   Ht 5\' 9"  (1.753 m)   Wt 172 lb (78 kg)   SpO2 95%   BMI 25.40 kg/m    Physical Exam Vitals signs and nursing note reviewed.  Constitutional:      General: He is not in acute distress.    Appearance: He is well-developed. He is not diaphoretic.  HENT:     Head: Normocephalic and atraumatic.     Mouth/Throat:     Pharynx: No oropharyngeal exudate.  Eyes:     Pupils: Pupils are equal, round, and reactive to light.  Neck:     Musculoskeletal: Normal range of motion and neck supple.     Thyroid: No thyromegaly.     Vascular: No JVD.     Trachea: No tracheal deviation.  Cardiovascular:     Rate and Rhythm: Normal rate and regular rhythm.     Heart sounds: Normal heart sounds. No murmur. No friction rub. No gallop.   Pulmonary:     Effort: Pulmonary effort is normal. No respiratory distress.     Breath  sounds: Normal breath sounds. No wheezing or rales.  Chest:     Chest wall: No tenderness.  Abdominal:     Palpations: Abdomen is soft.     Tenderness: There is no abdominal tenderness. There is no guarding.  Musculoskeletal: Normal range of motion.  Lymphadenopathy:     Cervical: No cervical adenopathy.  Skin:    General: Skin is warm and dry.  Neurological:     Mental Status: He is alert and oriented to person, place, and time.     Cranial Nerves: No cranial nerve deficit.  Psychiatric:        Behavior: Behavior normal.        Thought Content: Thought content normal.        Judgment: Judgment normal.      LABS: No results found for this or any previous visit (from the past 2160  hour(s)).    Assessment/Plan: 1. Encounter for general adult medical examination with abnormal findings Of health maintenance labs are ordered we will follow-up when results are available. - CBC with Differential/Platelet - Lipid Panel With LDL/HDL Ratio - TSH - T4, free - Comprehensive metabolic panel  2. Acute bronchitis with COPD ([place]) Refill patient albuterol inhaler at this time.  Continue to use 2 puffs every 6 hours as needed for shortness of breath. - albuterol (VENTOLIN HFA) 108 (90 Base) MCG/ACT inhaler; Inhale 2 puffs into the lungs every 6 (six) hours as needed for wheezing or shortness of breath.  Dispense: 1 Inhaler; Refill: 2  3. Chronic obstructive pulmonary disease, unspecified COPD type ([place]) Refilled patient Breo and albuterol nebulizer solution.  Overall patient COPD is moderately controlled.  Patient reports good relief of symptoms with using his medications regularly.  Continue to do so at this time. - fluticasone furoate-vilanterol (BREO ELLIPTA) 100-25 MCG/INH AEPB; INHALE 1 PUFF BY MOUTH ONCE DAILY  Dispense: 3 each; Refill: 3 - albuterol (PROVENTIL) (2.5 MG/3ML) 0.083% nebulizer solution; Take 3 mLs (2.5 mg total) by nebulization every 4 (four) hours as needed for wheezing  or shortness of breath.  Dispense: 300 mL; Refill: 3  4. 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 - meloxicam (MOBIC) 15 MG tablet; Take 1 tablet (15 mg total) by mouth daily.  Dispense: 90 tablet; Refill: 2  5. Claudication ([place]) Refill patient's Pletal for intermittent claudication.  Patient ports good results when using medications.  We will continue these medications as prescribed - cilostazol (PLETAL) 50 MG tablet; Take 1 tablet (50 mg total) by mouth 2 (two) times daily.  Dispense: 180 tablet; Refill: 2  6. Benign prostatic hyperplasia without lower urinary tract symptoms Refill patient's Flomax and provided referral to urology at patient's request for increased symptoms. - tamsulosin (FLOMAX) 0.4 MG CAPS capsule; Take 1 capsule (0.4 mg total) by mouth daily.  Dispense: 90 capsule; Refill: 3 - Ambulatory referral to Urology  7. Screening for malignant neoplasm of prostate - PSA  8. Foot pain, bilateral Patient requesting referral to podiatry for bilateral foot pain. - Ambulatory referral to Podiatry  9. Dysuria - UA/M w/rflx Culture, Routine - Microscopic Examination  10. Hyperlipidemia, unspecified hyperlipidemia type Will get lipid panel to follow lipids.    General Counseling: Casimiro Needle understanding of the findings of todays visit and agrees with plan of treatment. I have discussed any further diagnostic evaluation that may be needed or ordered today. We also reviewed his medications today. he has been encouraged to call the office with any questions or concerns that should arise related to todays visit.   Orders Placed This Encounter  Procedures  . CBC with Differential/Platelet  . Lipid Panel With LDL/HDL Ratio  . TSH  . T4, free  . Comprehensive metabolic panel  . PSA  . Ambulatory referral to Urology  . Ambulatory referral to [place] ordered this encounter  Medications  .  fluticasone furoate-vilanterol (BREO ELLIPTA) 100-25 MCG/INH AEPB    Sig: INHALE 1 PUFF BY MOUTH ONCE DAILY    Dispense:  3 each    Refill:  3    Please consider 90 day supplies to promote better adherence  . albuterol (PROVENTIL) (2.5 MG/3ML) 0.083% nebulizer solution    Sig: Take 3 mLs (2.5 mg total) by nebulization every 4 (four) hours as needed for wheezing or shortness of breath.    Dispense:  300  mL    Refill:  3  . tamsulosin (FLOMAX) 0.4 MG CAPS capsule    Sig: Take 1 capsule (0.4 mg total) by mouth daily.    Dispense:  90 capsule    Refill:  3    Patient will need a office visit for any additional refills.  . meloxicam (MOBIC) 15 MG tablet    Sig: Take 1 tablet (15 mg total) by mouth daily.    Dispense:  90 tablet    Refill:  2  . cilostazol (PLETAL) 50 MG tablet    Sig: Take 1 tablet (50 mg total) by mouth 2 (two) times daily.    Dispense:  180 tablet    Refill:  2  . albuterol (VENTOLIN HFA) 108 (90 Base) MCG/ACT inhaler    Sig: Inhale 2 puffs into the lungs every 6 (six) hours as needed for wheezing or shortness of breath.    Dispense:  1 Inhaler    Refill:  2    Time spent: 35 Minutes   This patient was seen by Orson Gear AGNP-C in Collaboration with Dr Lavera Guise as a part of collaborative care agreement    Kendell Bane AGNP-C Internal Medicine

## 2019-01-31 DIAGNOSIS — Z791 Long term (current) use of non-steroidal anti-inflammatories (NSAID): Secondary | ICD-10-CM | POA: Diagnosis not present

## 2019-01-31 DIAGNOSIS — J449 Chronic obstructive pulmonary disease, unspecified: Secondary | ICD-10-CM | POA: Diagnosis not present

## 2019-01-31 DIAGNOSIS — N529 Male erectile dysfunction, unspecified: Secondary | ICD-10-CM | POA: Diagnosis not present

## 2019-01-31 DIAGNOSIS — N400 Enlarged prostate without lower urinary tract symptoms: Secondary | ICD-10-CM | POA: Diagnosis not present

## 2019-01-31 DIAGNOSIS — Z720 Tobacco use: Secondary | ICD-10-CM | POA: Diagnosis not present

## 2019-01-31 DIAGNOSIS — G8929 Other chronic pain: Secondary | ICD-10-CM | POA: Diagnosis not present

## 2019-01-31 DIAGNOSIS — J302 Other seasonal allergic rhinitis: Secondary | ICD-10-CM | POA: Diagnosis not present

## 2019-01-31 DIAGNOSIS — M62838 Other muscle spasm: Secondary | ICD-10-CM | POA: Diagnosis not present

## 2019-01-31 DIAGNOSIS — Z7951 Long term (current) use of inhaled steroids: Secondary | ICD-10-CM | POA: Diagnosis not present

## 2019-01-31 DIAGNOSIS — Z8249 Family history of ischemic heart disease and other diseases of the circulatory system: Secondary | ICD-10-CM | POA: Diagnosis not present

## 2019-01-31 LAB — UA/M W/RFLX CULTURE, ROUTINE
Bilirubin, UA: NEGATIVE
Glucose, UA: NEGATIVE
Ketones, UA: NEGATIVE
Leukocytes,UA: NEGATIVE
Nitrite, UA: NEGATIVE
Protein,UA: NEGATIVE
RBC, UA: NEGATIVE
Specific Gravity, UA: 1.021 (ref 1.005–1.030)
Urobilinogen, Ur: 1 mg/dL (ref 0.2–1.0)
pH, UA: 7 (ref 5.0–7.5)

## 2019-01-31 LAB — MICROSCOPIC EXAMINATION
Bacteria, UA: NONE SEEN
Casts: NONE SEEN /lpf
Epithelial Cells (non renal): NONE SEEN /hpf (ref 0–10)
RBC, Urine: NONE SEEN /hpf (ref 0–2)

## 2019-02-02 DIAGNOSIS — Z125 Encounter for screening for malignant neoplasm of prostate: Secondary | ICD-10-CM | POA: Diagnosis not present

## 2019-02-02 DIAGNOSIS — R5381 Other malaise: Secondary | ICD-10-CM | POA: Diagnosis not present

## 2019-02-02 DIAGNOSIS — R69 Illness, unspecified: Secondary | ICD-10-CM | POA: Diagnosis not present

## 2019-02-02 DIAGNOSIS — E079 Disorder of thyroid, unspecified: Secondary | ICD-10-CM | POA: Diagnosis not present

## 2019-02-02 DIAGNOSIS — E0781 Sick-euthyroid syndrome: Secondary | ICD-10-CM | POA: Diagnosis not present

## 2019-02-02 DIAGNOSIS — Z0001 Encounter for general adult medical examination with abnormal findings: Secondary | ICD-10-CM | POA: Diagnosis not present

## 2019-02-02 DIAGNOSIS — E756 Lipid storage disorder, unspecified: Secondary | ICD-10-CM | POA: Diagnosis not present

## 2019-02-02 LAB — LIPID PANEL WITH LDL/HDL RATIO

## 2019-02-03 LAB — CBC WITH DIFFERENTIAL/PLATELET
Basophils Absolute: 0.1 10*3/uL (ref 0.0–0.2)
Basos: 1 %
EOS (ABSOLUTE): 0.1 10*3/uL (ref 0.0–0.4)
Eos: 2 %
Hematocrit: 50.4 % (ref 37.5–51.0)
Hemoglobin: 17.5 g/dL (ref 13.0–17.7)
Immature Grans (Abs): 0 10*3/uL (ref 0.0–0.1)
Immature Granulocytes: 0 %
Lymphocytes Absolute: 2 10*3/uL (ref 0.7–3.1)
Lymphs: 31 %
MCH: 32.8 pg (ref 26.6–33.0)
MCHC: 34.7 g/dL (ref 31.5–35.7)
MCV: 94 fL (ref 79–97)
Monocytes Absolute: 0.4 10*3/uL (ref 0.1–0.9)
Monocytes: 7 %
Neutrophils Absolute: 3.8 10*3/uL (ref 1.4–7.0)
Neutrophils: 59 %
Platelets: 160 10*3/uL (ref 150–450)
RBC: 5.34 x10E6/uL (ref 4.14–5.80)
RDW: 13.5 % (ref 11.6–15.4)
WBC: 6.4 10*3/uL (ref 3.4–10.8)

## 2019-02-03 LAB — COMPREHENSIVE METABOLIC PANEL
ALT: 12 IU/L (ref 0–44)
AST: 12 IU/L (ref 0–40)
Albumin/Globulin Ratio: 2.4 — ABNORMAL HIGH (ref 1.2–2.2)
Albumin: 4 g/dL (ref 3.7–4.7)
Alkaline Phosphatase: 80 IU/L (ref 39–117)
BUN/Creatinine Ratio: 14 (ref 10–24)
BUN: 13 mg/dL (ref 8–27)
Bilirubin Total: 0.4 mg/dL (ref 0.0–1.2)
CO2: 22 mmol/L (ref 20–29)
Calcium: 8.8 mg/dL (ref 8.6–10.2)
Chloride: 107 mmol/L — ABNORMAL HIGH (ref 96–106)
Creatinine, Ser: 0.92 mg/dL (ref 0.76–1.27)
GFR calc Af Amer: 96 mL/min/{1.73_m2} (ref >59)
GFR calc non Af Amer: 83 mL/min/{1.73_m2} (ref >59)
Globulin, Total: 1.7 g/dL (ref 1.5–4.5)
Glucose: 120 mg/dL — ABNORMAL HIGH (ref 65–99)
Potassium: 4.2 mmol/L (ref 3.5–5.2)
Sodium: 142 mmol/L (ref 134–144)
Total Protein: 5.7 g/dL — ABNORMAL LOW (ref 6.0–8.5)

## 2019-02-03 LAB — LIPID PANEL WITH LDL/HDL RATIO
Cholesterol, Total: 218 mg/dL — ABNORMAL HIGH (ref 100–199)
HDL: 36 mg/dL — ABNORMAL LOW (ref >39)
LDL Calculated: 154 mg/dL — ABNORMAL HIGH (ref 0–99)
LDl/HDL Ratio: 4.3 ratio — ABNORMAL HIGH (ref 0.0–3.6)
Triglycerides: 140 mg/dL (ref 0–149)
VLDL Cholesterol Cal: 28 mg/dL (ref 5–40)

## 2019-02-03 LAB — TSH: TSH: 1.25 u[IU]/mL (ref 0.450–4.500)

## 2019-02-03 LAB — PSA: Prostate Specific Ag, Serum: 1.1 ng/mL (ref 0.0–4.0)

## 2019-02-03 LAB — T4, FREE: Free T4: 1.18 ng/dL (ref 0.82–1.77)

## 2019-02-21 ENCOUNTER — Ambulatory Visit: Payer: Medicare HMO | Admitting: Podiatry

## 2019-02-23 ENCOUNTER — Ambulatory Visit: Payer: Medicare Other | Admitting: Urology

## 2019-02-23 ENCOUNTER — Other Ambulatory Visit: Payer: Self-pay

## 2019-02-23 ENCOUNTER — Ambulatory Visit: Payer: Medicare HMO | Admitting: Urology

## 2019-02-23 ENCOUNTER — Encounter: Payer: Self-pay | Admitting: Urology

## 2019-02-23 VITALS — BP 178/71 | HR 80 | Ht 69.0 in | Wt 181.0 lb

## 2019-02-23 DIAGNOSIS — N400 Enlarged prostate without lower urinary tract symptoms: Secondary | ICD-10-CM | POA: Diagnosis not present

## 2019-02-23 LAB — BLADDER SCAN AMB NON-IMAGING

## 2019-02-23 NOTE — Patient Instructions (Signed)

## 2019-02-23 NOTE — Progress Notes (Signed)
   02/23/2019 3:14 PM   [name] 20-Jul-1946 [id number]  Reason for visit: Follow up BPH/LUTS  HPI: I saw [name] in urology clinic today in follow-up for urinary symptoms.  He was previously followed by Dr. Pilar Jarvis.  He is a 73 year old male whose primary complaint is occasional dysuria with voiding.  He was previously worked up with cystoscopy which was negative.  He is on nightly Flomax which he feels improves the symptoms around 80%.  He denies any weak stream or difficulty voiding.  He denies any gross hematuria or flank pain.  Overall, he feels he is doing very well from a urinary perspective.  Recent PSA in June 2020 was normal and stable at 1.1.  Prostate measures 58 g on CT in 2016.  PVR in clinic today 118 cc.   ROS: Please see flowsheet from today's date for complete review of systems.  Physical Exam: BP (!) 178/71   Pulse 80   Ht 5\' 9"  (1.753 m)   Wt 181 lb (82.1 kg)   BMI 26.73 kg/m    Constitutional:  Alert and oriented, No acute distress. Respiratory: Normal respiratory effort, no increased work of breathing. GI: Abdomen is soft, nontender, nondistended, no abdominal masses Skin: No rashes, bruises or suspicious lesions. Neurologic: Grossly intact, no focal deficits, moving all 4 extremities. Psychiatric: Normal mood and affect  Laboratory Data: PSA 01/2019: 1.1  Pertinent Imaging: Prostate measures 58 g on CT from 2016  Assessment & Plan:   In summary, the patient is a 73 year old male with very mild urinary symptoms of occasional dysuria that is very well controlled on nightly Flomax.  We reviewed the AUA guidelines that do not recommend routine PSA screening in men over age 43.  He is in agreement to discontinue PSA screening in the future.  RTC 1 year for symptom check   A total of 15 minutes were spent face-to-face with the patient, greater than 50% was spent in patient education, counseling, and coordination of care regarding PSA screening and  BPH/LUTS.   Billey Co, [facility] [address], [place], [place] [address] [phone]

## 2019-02-28 ENCOUNTER — Other Ambulatory Visit: Payer: Self-pay

## 2019-02-28 ENCOUNTER — Ambulatory Visit (INDEPENDENT_AMBULATORY_CARE_PROVIDER_SITE_OTHER): Payer: Medicare HMO

## 2019-02-28 ENCOUNTER — Ambulatory Visit: Payer: Medicare HMO | Admitting: Podiatry

## 2019-02-28 ENCOUNTER — Encounter: Payer: Self-pay | Admitting: Podiatry

## 2019-02-28 ENCOUNTER — Other Ambulatory Visit: Payer: Self-pay | Admitting: Podiatry

## 2019-02-28 VITALS — Temp 98.4°F

## 2019-02-28 DIAGNOSIS — G629 Polyneuropathy, unspecified: Secondary | ICD-10-CM

## 2019-02-28 DIAGNOSIS — M5416 Radiculopathy, lumbar region: Secondary | ICD-10-CM

## 2019-02-28 DIAGNOSIS — M779 Enthesopathy, unspecified: Secondary | ICD-10-CM

## 2019-02-28 MED ORDER — GABAPENTIN 100 MG PO CAPS: 100.0000 mg | ORAL_CAPSULE | Freq: Three times a day (TID) | ORAL | 3 refills | Status: DC

## 2019-03-02 NOTE — Progress Notes (Signed)
   HPI: 73 year old male presenting today as a new patient with a chief complaint of constant aching, throbbing pain noted to the bilateral feet that began a few years ago. He reports associated numbness, tingling and pain that radiates from his toes, up his legs to his back. He states it is getting progressively worse. He has taken Lyrica and Soma for treatment. He has also tried different arch supports with no significant relief. Patient is here for further evaluation and treatment.   Past Medical History:  Diagnosis Date  . Back problem   . COPD (chronic obstructive pulmonary disease) ([place])   . Hyperlipidemia      Physical Exam: General: The patient is alert and oriented x3 in no acute distress.  Dermatology: Skin is warm, dry and supple bilateral lower extremities. Negative for open lesions or macerations.  Vascular: Palpable pedal pulses bilaterally. No edema or erythema noted. Capillary refill within normal limits.  Neurological: Epicritic and protective threshold diminished bilaterally.   Musculoskeletal Exam: Range of motion within normal limits to all pedal and ankle joints bilateral. Muscle strength 5/5 in all groups bilateral.   Radiographic Exam:  Normal osseous mineralization. Joint spaces preserved. No fracture/dislocation/boney destruction.    Assessment: 1. Lumbar radiculopathy BLE with associated peripheral polyneuropathy    Plan of Care:  1. Patient evaluated. X-Rays reviewed.  2. Prescription for Gabapentin 100 mg TID provided to patient.  3. Recommended follow up with Neuro Spine specialist. Patient has had many Neuro consults previously.  4. Return to clinic in 4 weeks.      Edrick Kins, DPM [facility]  Dr. Edrick Kins, DPM    [address], [place] [address]                Office [phone]  Fax [phone]

## 2019-03-28 ENCOUNTER — Encounter: Payer: Self-pay | Admitting: Podiatry

## 2019-03-28 ENCOUNTER — Other Ambulatory Visit: Payer: Self-pay

## 2019-03-28 ENCOUNTER — Ambulatory Visit: Payer: Medicare HMO | Admitting: Podiatry

## 2019-03-28 VITALS — Temp 98.7°F

## 2019-03-28 DIAGNOSIS — M5416 Radiculopathy, lumbar region: Secondary | ICD-10-CM | POA: Diagnosis not present

## 2019-03-28 DIAGNOSIS — B351 Tinea unguium: Secondary | ICD-10-CM | POA: Diagnosis not present

## 2019-03-28 DIAGNOSIS — G629 Polyneuropathy, unspecified: Secondary | ICD-10-CM

## 2019-03-28 DIAGNOSIS — M79676 Pain in unspecified toe(s): Secondary | ICD-10-CM

## 2019-03-30 ENCOUNTER — Ambulatory Visit (INDEPENDENT_AMBULATORY_CARE_PROVIDER_SITE_OTHER): Payer: Medicare HMO | Admitting: Adult Health

## 2019-03-30 ENCOUNTER — Encounter: Payer: Self-pay | Admitting: Adult Health

## 2019-03-30 ENCOUNTER — Other Ambulatory Visit: Payer: Self-pay

## 2019-03-30 VITALS — BP 176/72 | HR 68 | Resp 16 | Ht 69.0 in | Wt 174.0 lb

## 2019-03-30 DIAGNOSIS — F17200 Nicotine dependence, unspecified, uncomplicated: Secondary | ICD-10-CM | POA: Diagnosis not present

## 2019-03-30 DIAGNOSIS — I739 Peripheral vascular disease, unspecified: Secondary | ICD-10-CM

## 2019-03-30 DIAGNOSIS — J449 Chronic obstructive pulmonary disease, unspecified: Secondary | ICD-10-CM | POA: Diagnosis not present

## 2019-03-30 DIAGNOSIS — R69 Illness, unspecified: Secondary | ICD-10-CM | POA: Diagnosis not present

## 2019-03-30 MED ORDER — TIZANIDINE HCL 4 MG PO CAPS: 4.0000 mg | ORAL_CAPSULE | Freq: Every day | ORAL | 2 refills | Status: DC

## 2019-03-30 MED ORDER — ALBUTEROL SULFATE 1.25 MG/3ML IN NEBU: 1.0000 | INHALATION_SOLUTION | Freq: Four times a day (QID) | RESPIRATORY_TRACT | 4 refills | Status: DC | PRN

## 2019-03-30 NOTE — Progress Notes (Signed)
[facility] [place], [place] [address]  Internal MEDICINE  Office Visit Note  Patient Name: Brian Key  [id number]  [id number]  Date of Service: 03/30/2019  Chief Complaint  Patient presents with  . Medical Management of Chronic Issues    8 week folllow up labs, bp elevated     HPI PT is here for 8 week follow up on blood pressure and to follow up on his labs.  His bp remains elevated 176/72 today.  He Denies Chest pain, Shortness of breath, palpitations, headache, or blurred vision.  He continues to report nerve pain in the lower extremities, he would like to see a neurologist at this time.  His copd is controlled at this time using Nebulizer, breo daily.  He unfortunately continues to smoke 1 ppd of cigarettes.     Current Medication: Outpatient Encounter Medications as of 03/30/2019  Medication Sig  . albuterol (VENTOLIN HFA) 108 (90 Base) MCG/ACT inhaler Inhale 2 puffs into the lungs every 6 (six) hours as needed for wheezing or shortness of breath.  . fluticasone furoate-vilanterol (BREO ELLIPTA) 100-25 MCG/INH AEPB INHALE 1 PUFF BY MOUTH ONCE DAILY  . gabapentin (NEURONTIN) 100 MG capsule Take 1 capsule (100 mg total) by mouth 3 (three) times daily.  . meloxicam (MOBIC) 15 MG tablet Take 1 tablet (15 mg total) by mouth daily.  . tamsulosin (FLOMAX) 0.4 MG CAPS capsule Take 1 capsule (0.4 mg total) by mouth daily.  Marland Kitchen tiZANidine (ZANAFLEX) 4 MG capsule Take 4 mg by mouth 3 (three) times daily.   No facility-administered encounter medications on file as of 03/30/2019.     Surgical History: Past Surgical History:  Procedure Laterality Date  . ANKLE SURGERY  2009  . [place]  . HERNIA REPAIR  [id number]   umbilical  . LIPOMA EXCISION  02/16/2017   back/ Dr Bary Castilla  . NECK SURGERY    . WRIST SURGERY Left 2007    Medical History: Past Medical History:  Diagnosis Date  . Back problem   . COPD (chronic obstructive pulmonary disease)  ([place])   . Hyperlipidemia     Family History: Family History  Problem Relation Age of Onset  . Prostate cancer Neg Hx   . Bladder Cancer Neg Hx   . Kidney cancer Neg Hx     Social History   Socioeconomic History  . Marital status: Married    Spouse name: Not on file  . Number of children: Not on file  . Years of education: Not on file  . Highest education level: Not on file  Occupational History  . Not on file  Social Needs  . Financial resource strain: Not on file  . Food insecurity    Worry: Not on file    Inability: Not on file  . Transportation needs    Medical: Not on file    Non-medical: Not on file  Tobacco Use  . Smoking status: Current Every Day Smoker    Packs/day: 1.00    Years: 50.00    Pack years: 50.00  . Smokeless tobacco: Never Used  Substance and Sexual Activity  . Alcohol use: Yes    Comment: occasionally  . Drug use: No  . Sexual activity: Not on file  Lifestyle  . Physical activity    Days per week: Not on file    Minutes per session: Not on file  . Stress: Not on file  Relationships  . Social connections  Talks on phone: Not on file    Gets together: Not on file    Attends religious service: Not on file    Active member of club or organization: Not on file    Attends meetings of clubs or organizations: Not on file    Relationship status: Not on file  . Intimate partner violence    Fear of current or ex partner: Not on file    Emotionally abused: Not on file    Physically abused: Not on file    Forced sexual activity: Not on file  Other Topics Concern  . Not on file  Social History Narrative  . Not on file      Review of Systems  Constitutional: Negative.  Negative for chills, fatigue and unexpected weight change.  HENT: Negative.  Negative for congestion, rhinorrhea, sneezing and sore throat.   Eyes: Negative for redness.  Respiratory: Negative.  Negative for cough, chest tightness and shortness of breath.   Cardiovascular:  Negative.  Negative for chest pain and palpitations.  Gastrointestinal: Negative.  Negative for abdominal pain, constipation, diarrhea, nausea and vomiting.  Endocrine: Negative.   Genitourinary: Negative.  Negative for dysuria and frequency.  Musculoskeletal: Negative.  Negative for arthralgias, back pain, joint swelling and neck pain.  Skin: Negative.  Negative for rash.  Allergic/Immunologic: Negative.   Neurological: Negative.  Negative for tremors and numbness.  Hematological: Negative for adenopathy. Does not bruise/bleed easily.  Psychiatric/Behavioral: Negative.  Negative for behavioral problems, sleep disturbance and suicidal ideas. The patient is not nervous/anxious.     Vital Signs: BP (!) 176/72   Pulse 68   Resp 16   Ht 5\' 9"  (1.753 m)   Wt 174 lb (78.9 kg)   SpO2 97%   BMI 25.70 kg/m    Physical Exam Vitals signs and nursing note reviewed.  Constitutional:      General: He is not in acute distress.    Appearance: He is well-developed. He is not diaphoretic.  HENT:     Head: Normocephalic and atraumatic.     Mouth/Throat:     Pharynx: No oropharyngeal exudate.  Eyes:     Pupils: Pupils are equal, round, and reactive to light.  Neck:     Musculoskeletal: Normal range of motion and neck supple.     Thyroid: No thyromegaly.     Vascular: No JVD.     Trachea: No tracheal deviation.  Cardiovascular:     Rate and Rhythm: Normal rate and regular rhythm.     Heart sounds: Normal heart sounds. No murmur. No friction rub. No gallop.   Pulmonary:     Effort: Pulmonary effort is normal. No respiratory distress.     Breath sounds: Normal breath sounds. No wheezing or rales.  Chest:     Chest wall: No tenderness.  Abdominal:     Palpations: Abdomen is soft.     Tenderness: There is no abdominal tenderness. There is no guarding.  Musculoskeletal: Normal range of motion.  Lymphadenopathy:     Cervical: No cervical adenopathy.  Skin:    General: Skin is warm and dry.   Neurological:     Mental Status: He is alert and oriented to person, place, and time.     Cranial Nerves: No cranial nerve deficit.  Psychiatric:        Behavior: Behavior normal.        Thought Content: Thought content normal.        Judgment: Judgment normal.  Assessment/Plan: 1. Chronic obstructive pulmonary disease, unspecified COPD type (HCC) Stable, Continue nebulized medications and breo as directed.    2. Nicotine dependence with current use Smoking cessation counseling: 1. Pt acknowledges the risks of long term smoking, she will try to quite smoking. 2. Options for different medications including nicotine products, chewing gum, patch etc, Wellbutrin and Chantix is discussed 3. Goal and date of compete cessation is discussed 4. Total time spent in smoking cessation is 15 min.   3. Claudication [facility]) See neurology for assessment.  - tiZANidine (ZANAFLEX) 4 MG capsule; Take 1 capsule (4 mg total) by mouth at bedtime.  Dispense: 30 capsule; Refill: 2 - Ambulatory referral to Neurology  General Counseling: Casimiro Needle understanding of the findings of todays visit and agrees with plan of treatment. I have discussed any further diagnostic evaluation that may be needed or ordered today. We also reviewed his medications today. he has been encouraged to call the office with any questions or concerns that should arise related to todays visit.    No orders of the defined types were placed in this encounter.   No orders of the defined types were placed in this encounter.   Time spent: 20 Minutes   This patient was seen by Orson Gear AGNP-C in Collaboration with Dr Lavera Guise as a part of collaborative care agreement     Kendell Bane AGNP-C Internal medicine

## 2019-03-30 NOTE — Progress Notes (Signed)
   HPI: 73 year old male presenting today for follow up evaluation of polyneuropathy. He states he is improving. He has been taking the Gabapentin which is helping to alleviate his symptoms. There are no worsening factors noted.  He has a new complaint of thickening and discoloration of the nails of the bilateral 2nd digits that has been ongoing for the past few years. He has not had any treatment for the symptoms. There are no modifying factors noted. Patient is here for further evaluation and treatment.   Past Medical History:  Diagnosis Date  . Back problem   . COPD (chronic obstructive pulmonary disease) ([place])   . Hyperlipidemia      Physical Exam: General: The patient is alert and oriented x3 in no acute distress.  Dermatology: Hyperkeratotic, discolored, thickened, onychodystrophy noted to the nail of the 2nd digit bilaterally. Skin is warm, dry and supple bilateral lower extremities. Negative for open lesions or macerations.  Vascular: Palpable pedal pulses bilaterally. No edema or erythema noted. Capillary refill within normal limits.  Neurological: Epicritic and protective threshold diminished bilaterally.   Musculoskeletal Exam: Range of motion within normal limits to all pedal and ankle joints bilateral. Muscle strength 5/5 in all groups bilateral.   Assessment: 1. Lumbar radiculopathy BLE with associated peripheral polyneuropathy - improved 2. Dystrophic nail 2nd digit bilateral   Plan of Care:  1. Patient evaluated.  2. Continue taking Gabapentin 100 mg TID.  3. Mechanical debridement of the 2nd toenail bilaterally performed using a nail nipper. Filed with dremel without incident.  4. Return to clinic as needed.      Edrick Kins, DPM [facility]  Dr. Edrick Kins, DPM    [address],  [address]                Office [phone]  Fax [phone]

## 2019-05-05 IMAGING — MR MR LUMBAR SPINE W/O CM
5 series · 35 of 48 positions shown · non-contrast
Comparison: MRI lumbar spine 06/28/2010

CLINICAL DATA: Low back and bilateral leg pain, left greater than
right. Chronic symptoms.

EXAM:
MRI LUMBAR SPINE WITHOUT CONTRAST
TECHNIQUE: Multiplanar, multisequence MR imaging of the lumbar spine was
performed. No intravenous contrast was administered.

[Series 2: T2 · sagittal · 4.0mm · 0.81mm/px · 6 of 17 slices shown (1 of 2)]
[im 1/17]
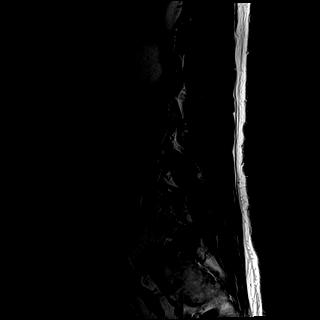
[im 4/17]
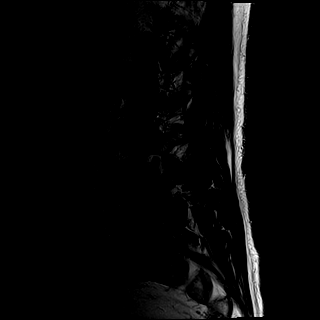
[im 7/17]
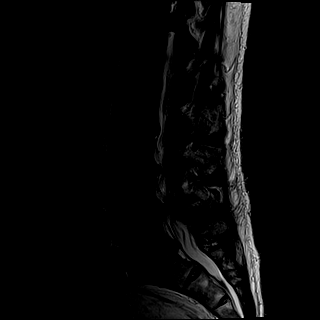
[im 10/17]
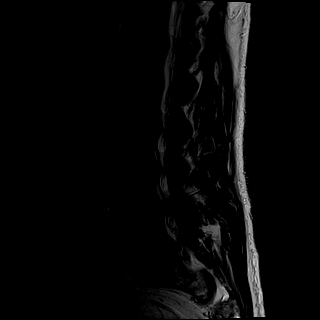
[im 13/17]
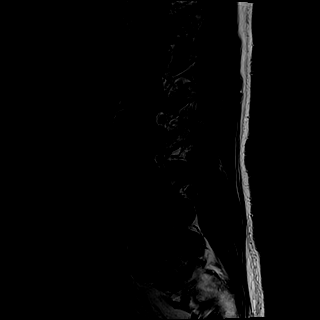
[im 17/17]
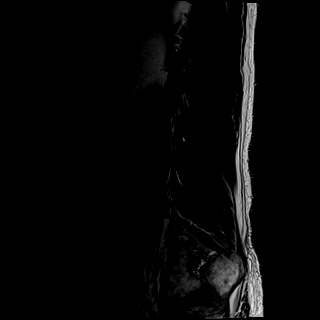

[Series 3: T1 · sagittal · 4.0mm · 0.81mm/px · 6 of 17 slices shown (1 of 2)]
[im 1/17]
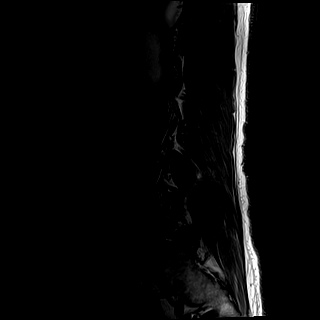
[im 4/17]
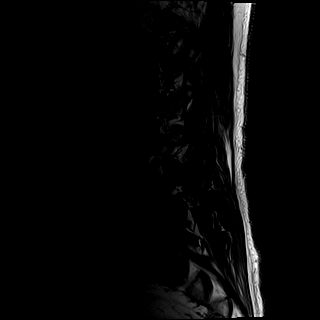
[im 7/17]
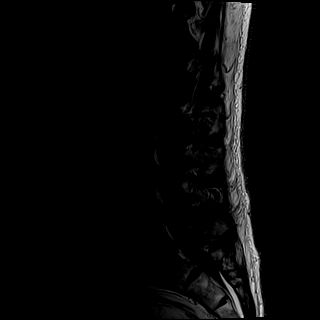
[im 10/17]
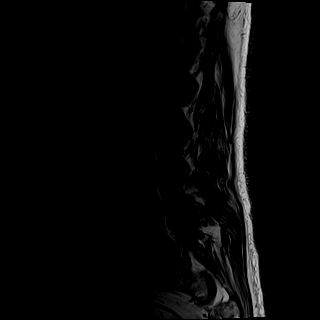
[im 13/17]
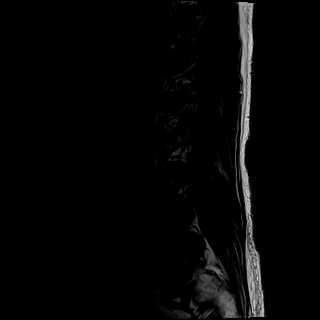
[im 17/17]
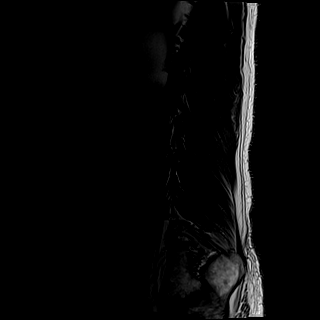

[Series 5: STIR · sagittal · 4.0mm · 1.02mm/px · 5 of 17 slices shown]
[im 1/17]
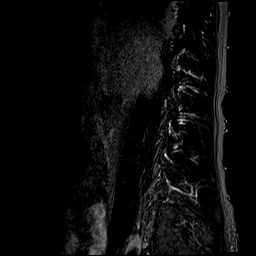
[im 4/17]
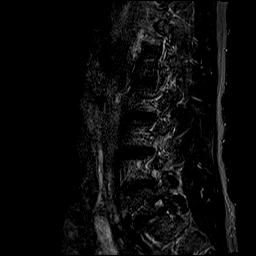
[im 7/17]
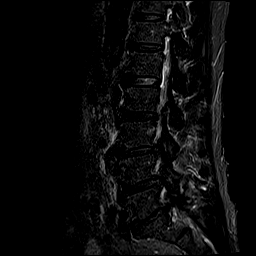
[im 10/17]
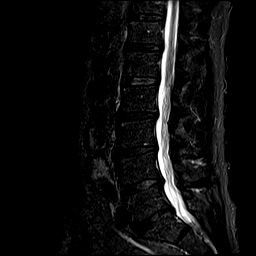
[im 13/17]
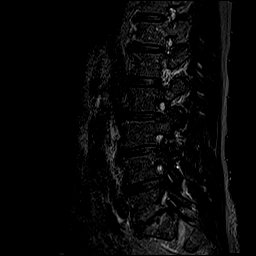

[Series 6: T2 · axial · 4.0mm · 0.78mm/px · z∈[-140,+78]mm · 9 of 42 slices shown (2 of 2)]
[im 1/42]
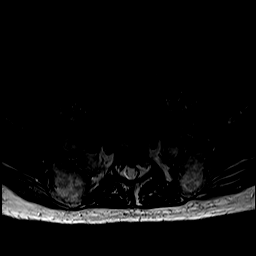
[im 6/42]
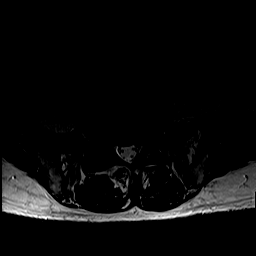
[im 12/42]
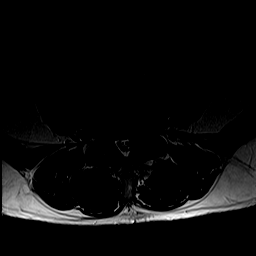
[im 18/42]
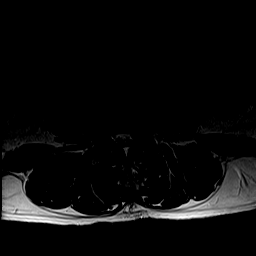
[im 21/42]
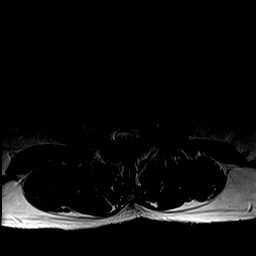
[im 24/42]
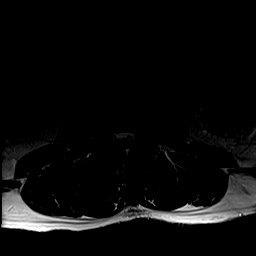
[im 30/42]
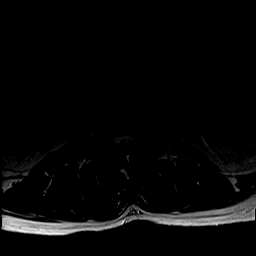
[im 36/42]
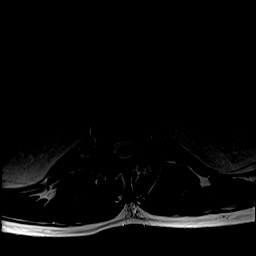
[im 42/42]
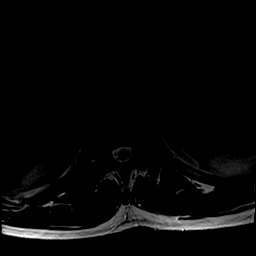

[Series 7: T1 · axial · 4.0mm · 0.39mm/px · z∈[-140,+78]mm · 9 of 42 slices shown (2 of 2)]
[im 1/42]
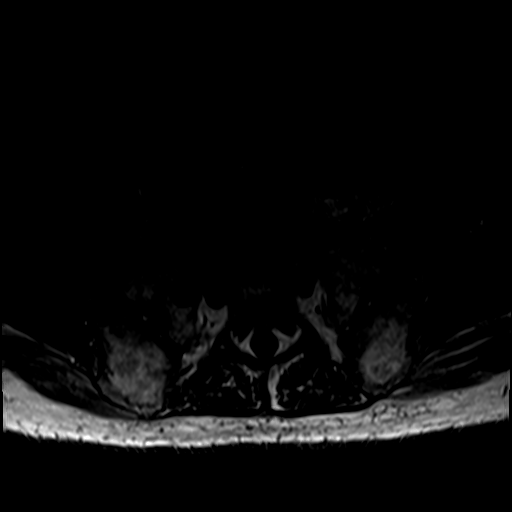
[im 6/42]
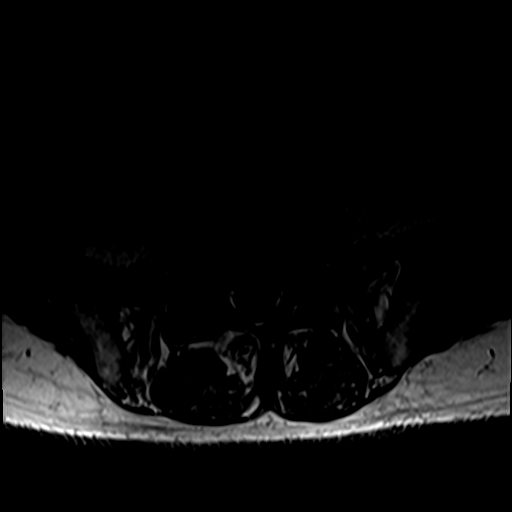
[im 12/42]
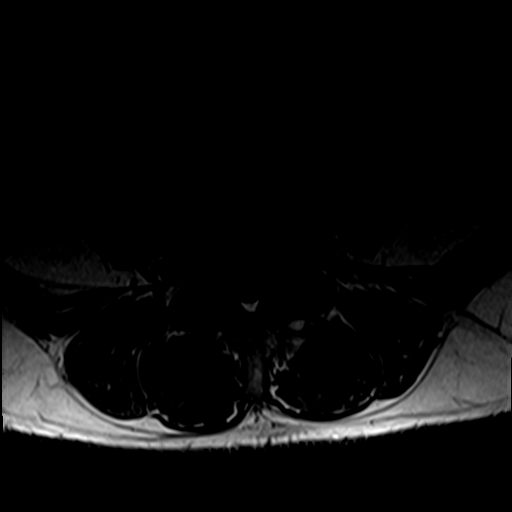
[im 18/42]
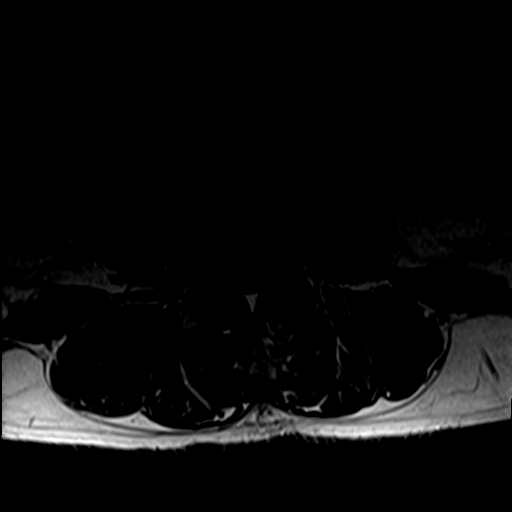
[im 21/42]
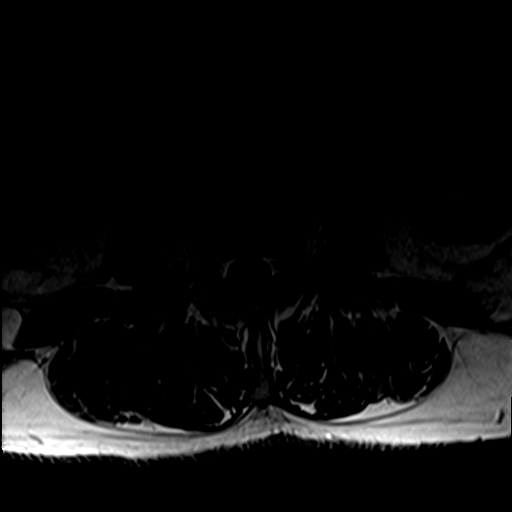
[im 24/42]
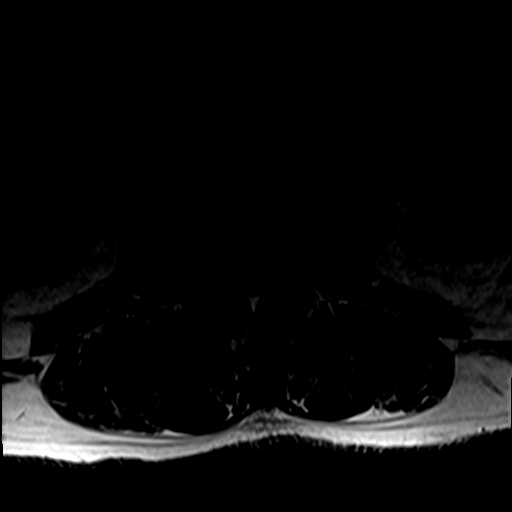
[im 30/42]
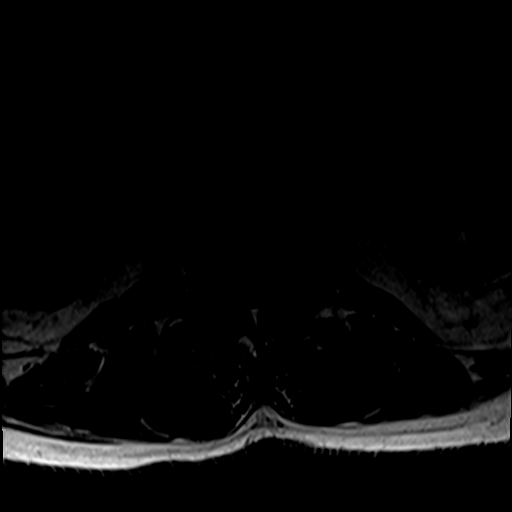
[im 36/42]
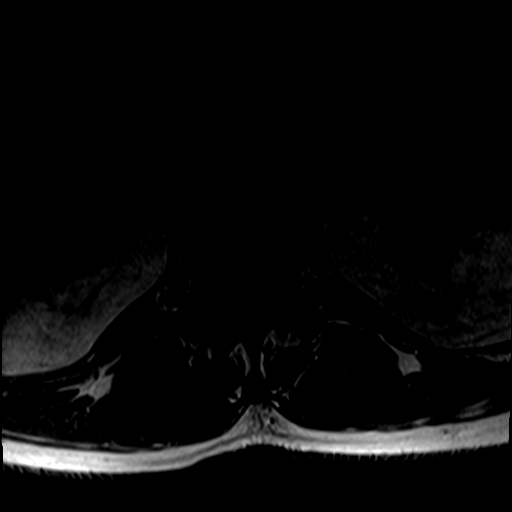
[im 42/42]
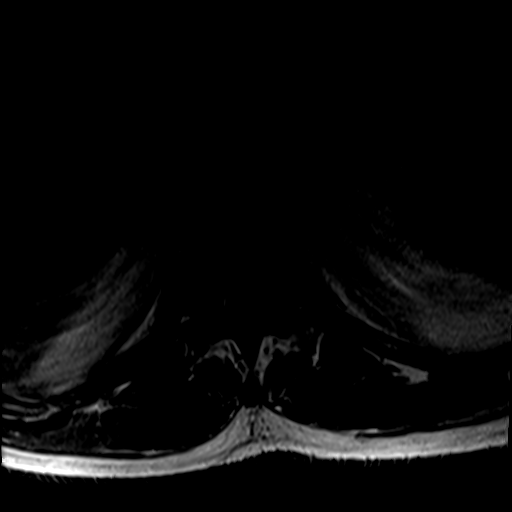

[35 of 48 positions shown; findings below may reference images not displayed]

FINDINGS: Segmentation: 5 lumbar type vertebral bodies. The last full
intervertebral disc space is labeled L5-S1. This correlates with the
prior examination.

Alignment:  Normal

Vertebrae:  No bone lesion or fracture.

Conus medullaris: Extends to the L2 level and appears normal.

Paraspinal and other soft tissues: No significant findings.

Disc levels:

T12-L1:  No significant findings.

L1-2: Mild facet disease but no disc protrusions, spinal or
foraminal stenosis.

L2-3: Extraforaminal and far lateral disc osteophyte complex
displacing the right L2 nerve root. No significant spinal, lateral
recess or left foraminal stenosis. Progressive findings when
compared to the prior study.

L3-4: Diffuse bulging annulus and moderate facet disease
contributing to mild bilateral lateral recess stenosis. There is
also a shallow broad-based right foraminal/extraforaminal disc
protrusion potentially irritating the right L3 nerve root. This
appears stable when compared to the prior examination.

L4-5: Diffuse bulging degenerated annulus, moderate facet disease
and ligamentum flavum thickening contributing to mild to moderate
spinal stenosis and moderate bilateral lateral recess stenosis.
There is also mild bilateral foraminal stenosis. These findings are
slightly progressive.

L5-S1: Bulging degenerated and slightly uncovered disc. There is
also osteophytic ridging and facet disease contributing to mild to
moderate bilateral lateral recess stenosis and foraminal stenosis.
These findings appear relatively stable.
IMPRESSION: 1. Degenerative lumbar spondylosis with multilevel disc disease and
facet disease.
2. Extraforaminal and far lateral disc osteophyte complex on the
right side at L2-3 displacing the right L2 nerve root.
3. Mild bilateral lateral recess stenosis at L3-4. There is also a
shallow broad-based right foraminal/extraforaminal disc protrusion
potentially irritating the right L3 nerve root.
4. Multifactorial mild to moderate spinal stenosis and moderate
bilateral lateral recess stenosis at L4-5. There is also mild
bilateral foraminal stenosis. Findings are progressive since the
prior study.
5. Multifactorial mild to moderate bilateral lateral recess and
foraminal stenosis at L5-S1 which appears stable.

## 2019-05-11 DIAGNOSIS — R202 Paresthesia of skin: Secondary | ICD-10-CM | POA: Diagnosis not present

## 2019-05-11 DIAGNOSIS — R200 Anesthesia of skin: Secondary | ICD-10-CM | POA: Diagnosis not present

## 2019-05-11 DIAGNOSIS — M545 Low back pain: Secondary | ICD-10-CM | POA: Diagnosis not present

## 2019-05-12 DIAGNOSIS — R200 Anesthesia of skin: Secondary | ICD-10-CM | POA: Insufficient documentation

## 2019-05-12 DIAGNOSIS — R202 Paresthesia of skin: Secondary | ICD-10-CM | POA: Insufficient documentation

## 2019-05-16 ENCOUNTER — Other Ambulatory Visit: Payer: Self-pay | Admitting: Acute Care

## 2019-05-16 DIAGNOSIS — M545 Low back pain, unspecified: Secondary | ICD-10-CM

## 2019-05-26 ENCOUNTER — Other Ambulatory Visit: Payer: Self-pay

## 2019-05-26 ENCOUNTER — Ambulatory Visit
Admission: RE | Admit: 2019-05-26 | Discharge: 2019-05-26 | Disposition: A | Discharge status: Home / Self Care | Payer: Medicare HMO | Source: Ambulatory Visit | Attending: Acute Care | Admitting: Acute Care

## 2019-05-26 DIAGNOSIS — M545 Low back pain, unspecified: Secondary | ICD-10-CM

## 2019-05-29 ENCOUNTER — Ambulatory Visit (INDEPENDENT_AMBULATORY_CARE_PROVIDER_SITE_OTHER): Payer: Medicare HMO | Admitting: Adult Health

## 2019-05-29 ENCOUNTER — Encounter: Payer: Self-pay | Admitting: Adult Health

## 2019-05-29 ENCOUNTER — Other Ambulatory Visit: Payer: Self-pay

## 2019-05-29 VITALS — BP 120/80 | HR 82 | Temp 97.4°F | Resp 16 | Ht 69.0 in | Wt 173.0 lb

## 2019-05-29 DIAGNOSIS — M62838 Other muscle spasm: Secondary | ICD-10-CM | POA: Diagnosis not present

## 2019-05-29 DIAGNOSIS — J449 Chronic obstructive pulmonary disease, unspecified: Secondary | ICD-10-CM

## 2019-05-29 DIAGNOSIS — F17200 Nicotine dependence, unspecified, uncomplicated: Secondary | ICD-10-CM | POA: Diagnosis not present

## 2019-05-29 DIAGNOSIS — I739 Peripheral vascular disease, unspecified: Secondary | ICD-10-CM

## 2019-05-29 DIAGNOSIS — I10 Essential (primary) hypertension: Secondary | ICD-10-CM

## 2019-05-29 DIAGNOSIS — R69 Illness, unspecified: Secondary | ICD-10-CM | POA: Diagnosis not present

## 2019-05-29 MED ORDER — TIZANIDINE HCL 4 MG PO TABS: 4.0000 mg | ORAL_TABLET | Freq: Two times a day (BID) | ORAL | 1 refills | Status: DC | PRN

## 2019-05-29 NOTE — Progress Notes (Signed)
[facility] [place], [place] [address]  Internal MEDICINE  Office Visit Note  Patient Name: Brian Key  [id number]  [id number]  Date of Service: 06/01/2019  Chief Complaint  Patient presents with  . COPD  . Hyperlipidemia    HPI Patient here for 8 week follow-up on blood pressure. Blood pressure much improved from previous exam 8 weeks prior, denies chest pain, headache or dizziness. Continues to not take any medication to manage blood pressure as he has had negative GI side effects in the past. Reports controlling his blood pressure through balanced diet. COPD remains stable, denies shortness of breath, wheezing or cough. Bilateral lower extremity claudication well managed on gabapentin and tizanidine, requesting we take over tizanidine prescription instead of having to go to podiatry to have medication refilled. Also requesting to increase dose of tizanidine.   Current Medication: Outpatient Encounter Medications as of 05/29/2019  Medication Sig  . albuterol (ACCUNEB) 1.25 MG/3ML nebulizer solution Take 3 mLs (1.25 mg total) by nebulization every 6 (six) hours as needed for wheezing.  Marland Kitchen albuterol (VENTOLIN HFA) 108 (90 Base) MCG/ACT inhaler Inhale 2 puffs into the lungs every 6 (six) hours as needed for wheezing or shortness of breath.  . fluticasone furoate-vilanterol (BREO ELLIPTA) 100-25 MCG/INH AEPB INHALE 1 PUFF BY MOUTH ONCE DAILY  . gabapentin (NEURONTIN) 100 MG capsule Take 1 capsule (100 mg total) by mouth 3 (three) times daily.  . meloxicam (MOBIC) 15 MG tablet Take 1 tablet (15 mg total) by mouth daily.  . tamsulosin (FLOMAX) 0.4 MG CAPS capsule Take 1 capsule (0.4 mg total) by mouth daily.  . [DISCONTINUED] tiZANidine (ZANAFLEX) 4 MG capsule Take 1 capsule (4 mg total) by mouth at bedtime.  Marland Kitchen tiZANidine (ZANAFLEX) 4 MG tablet Take 1 tablet (4 mg total) by mouth 2 (two) times daily as needed for muscle spasms.   No facility-administered  encounter medications on file as of 05/29/2019.     Surgical History: Past Surgical History:  Procedure Laterality Date  . ANKLE SURGERY  2009  . [place]  . HERNIA REPAIR  [id number]   umbilical  . LIPOMA EXCISION  02/16/2017   back/ Dr Bary Castilla  . NECK SURGERY    . WRIST SURGERY Left 2007    Medical History: Past Medical History:  Diagnosis Date  . Back problem   . COPD (chronic obstructive pulmonary disease) ([place])   . Hyperlipidemia     Family History: Family History  Problem Relation Age of Onset  . Prostate cancer Neg Hx   . Bladder Cancer Neg Hx   . Kidney cancer Neg Hx     Social History   Socioeconomic History  . Marital status: Married    Spouse name: Not on file  . Number of children: Not on file  . Years of education: Not on file  . Highest education level: Not on file  Occupational History  . Not on file  Social Needs  . Financial resource strain: Not on file  . Food insecurity    Worry: Not on file    Inability: Not on file  . Transportation needs    Medical: Not on file    Non-medical: Not on file  Tobacco Use  . Smoking status: Current Every Day Smoker    Packs/day: 1.00    Years: 50.00    Pack years: 50.00  . Smokeless tobacco: Never Used  Substance and Sexual Activity  . Alcohol use: Yes  Comment: occasionally  . Drug use: No  . Sexual activity: Not on file  Lifestyle  . Physical activity    Days per week: Not on file    Minutes per session: Not on file  . Stress: Not on file  Relationships  . Social Herbalist on phone: Not on file    Gets together: Not on file    Attends religious service: Not on file    Active member of club or organization: Not on file    Attends meetings of clubs or organizations: Not on file    Relationship status: Not on file  . Intimate partner violence    Fear of current or ex partner: Not on file    Emotionally abused: Not on file    Physically abused: Not on file    Forced  sexual activity: Not on file  Other Topics Concern  . Not on file  Social History Narrative  . Not on file    Review of Systems  Constitutional: Negative.  Negative for chills, fatigue and unexpected weight change.  HENT: Negative.  Negative for congestion, rhinorrhea, sneezing and sore throat.   Eyes: Negative for redness.  Respiratory: Negative.  Negative for cough, chest tightness and shortness of breath.   Cardiovascular: Negative.  Negative for chest pain and palpitations.  Gastrointestinal: Negative.  Negative for abdominal pain, constipation, diarrhea, nausea and vomiting.  Endocrine: Negative.   Genitourinary: Negative.  Negative for dysuria and frequency.  Musculoskeletal: Positive for myalgias. Negative for arthralgias, back pain, joint swelling and neck pain.       Chronic bilateral leg pain and "spasms"  Skin: Negative.  Negative for rash.  Allergic/Immunologic: Negative.   Neurological: Negative.  Negative for tremors and numbness.  Hematological: Negative for adenopathy. Does not bruise/bleed easily.  Psychiatric/Behavioral: Negative.  Negative for behavioral problems, sleep disturbance and suicidal ideas. The patient is not nervous/anxious.       Vital Signs: BP 120/80   Pulse 82   Temp (!) 97.4 F (36.3 C)   Resp 16   Ht 5\' 9"  (1.753 m)   Wt 173 lb (78.5 kg)   SpO2 98%   BMI 25.55 kg/m    Physical Exam Vitals signs and nursing note reviewed.  Constitutional:      General: He is not in acute distress.    Appearance: He is well-developed. He is not diaphoretic.  HENT:     Head: Normocephalic and atraumatic.     Mouth/Throat:     Pharynx: No oropharyngeal exudate.  Eyes:     Pupils: Pupils are equal, round, and reactive to light.  Neck:     Musculoskeletal: Normal range of motion and neck supple.     Thyroid: No thyromegaly.     Vascular: No JVD.     Trachea: No tracheal deviation.  Cardiovascular:     Rate and Rhythm: Normal rate and regular  rhythm.     Heart sounds: Normal heart sounds. No murmur. No friction rub. No gallop.   Pulmonary:     Effort: Pulmonary effort is normal. No respiratory distress.     Breath sounds: Normal breath sounds. No wheezing or rales.  Chest:     Chest wall: No tenderness.  Abdominal:     Palpations: Abdomen is soft.  Musculoskeletal: Normal range of motion.     Comments: Chronic bilateral lower extremity with associated muscle spasms  Lymphadenopathy:     Cervical: No cervical adenopathy.  Skin:  General: Skin is warm and dry.  Neurological:     Mental Status: He is alert and oriented to person, place, and time.     Cranial Nerves: No cranial nerve deficit.  Psychiatric:        Behavior: Behavior normal.        Thought Content: Thought content normal.        Judgment: Judgment normal.    Assessment/Plan: 1. Essential hypertension Stable at this time on no current therapy. Encouraged to continue to follow a healthy diet and to start exercising daily. Continue to closely monitor blood pressure.  2. Muscle spasm Increased current dose of tizanidine to allow patient to take twice a day as needed for muscle spasms. Also referred to neurology and continuing to order imaging and follow-up. - tiZANidine (ZANAFLEX) 4 MG tablet; Take 1 tablet (4 mg total) by mouth 2 (two) times daily as needed for muscle spasms.  Dispense: 60 tablet; Refill: 1  3. Chronic obstructive pulmonary disease, unspecified COPD type ([place]) Stable on current therapy, continue to monitor. Encouraged compliance with medication and to stop smoking.  4. Nicotine dependence with current use Smoking cessation counseling: 1. Pt acknowledges the risks of long term smoking, she will try to quite smoking. 2. Options for different medications including nicotine products, chewing gum, patch etc, Wellbutrin and Chantix is discussed 3. Goal and date of compete cessation is discussed 4. Total time spent in smoking cessation is 15  min.  5. Claudication ([place]) Stable on current therapy, continue to follow-up with neurology and continue to monitor.  General Counseling: Casimiro Needle understanding of the findings of todays visit and agrees with plan of treatment. I have discussed any further diagnostic evaluation that may be needed or ordered today. We also reviewed his medications today. he has been encouraged to call the office with any questions or concerns that should arise related to todays visit.    No orders of the defined types were placed in this encounter.   Meds ordered this encounter  Medications  . tiZANidine (ZANAFLEX) 4 MG tablet    Sig: Take 1 tablet (4 mg total) by mouth 2 (two) times daily as needed for muscle spasms.    Dispense:  60 tablet    Refill:  1    Time spent:25 Minutes   This patient was seen by Orson Gear AGNP-C in Collaboration with Dr Lavera Guise as a part of collaborative care agreement     Kendell Bane AGNP-C Internal medicine

## 2019-06-06 DIAGNOSIS — R69 Illness, unspecified: Secondary | ICD-10-CM | POA: Diagnosis not present

## 2019-06-07 DIAGNOSIS — M542 Cervicalgia: Secondary | ICD-10-CM | POA: Diagnosis not present

## 2019-06-07 DIAGNOSIS — M79601 Pain in right arm: Secondary | ICD-10-CM | POA: Diagnosis not present

## 2019-06-07 DIAGNOSIS — R200 Anesthesia of skin: Secondary | ICD-10-CM | POA: Diagnosis not present

## 2019-06-12 DIAGNOSIS — M545 Low back pain: Secondary | ICD-10-CM | POA: Diagnosis not present

## 2019-06-12 DIAGNOSIS — R200 Anesthesia of skin: Secondary | ICD-10-CM | POA: Insufficient documentation

## 2019-06-12 DIAGNOSIS — M79672 Pain in left foot: Secondary | ICD-10-CM | POA: Diagnosis not present

## 2019-06-12 DIAGNOSIS — M79601 Pain in right arm: Secondary | ICD-10-CM | POA: Insufficient documentation

## 2019-06-12 DIAGNOSIS — M79671 Pain in right foot: Secondary | ICD-10-CM | POA: Diagnosis not present

## 2019-06-12 DIAGNOSIS — M542 Cervicalgia: Secondary | ICD-10-CM | POA: Insufficient documentation

## 2019-06-15 DIAGNOSIS — M79671 Pain in right foot: Secondary | ICD-10-CM | POA: Insufficient documentation

## 2019-06-15 DIAGNOSIS — M545 Low back pain, unspecified: Secondary | ICD-10-CM | POA: Insufficient documentation

## 2019-06-15 DIAGNOSIS — M79672 Pain in left foot: Secondary | ICD-10-CM | POA: Insufficient documentation

## 2019-06-19 DIAGNOSIS — E538 Deficiency of other specified B group vitamins: Secondary | ICD-10-CM | POA: Diagnosis not present

## 2019-06-19 DIAGNOSIS — M79601 Pain in right arm: Secondary | ICD-10-CM | POA: Diagnosis not present

## 2019-06-19 DIAGNOSIS — R202 Paresthesia of skin: Secondary | ICD-10-CM | POA: Diagnosis not present

## 2019-06-19 DIAGNOSIS — M79672 Pain in left foot: Secondary | ICD-10-CM | POA: Diagnosis not present

## 2019-06-19 DIAGNOSIS — E559 Vitamin D deficiency, unspecified: Secondary | ICD-10-CM | POA: Diagnosis not present

## 2019-06-19 DIAGNOSIS — G629 Polyneuropathy, unspecified: Secondary | ICD-10-CM | POA: Diagnosis not present

## 2019-06-19 DIAGNOSIS — R200 Anesthesia of skin: Secondary | ICD-10-CM | POA: Diagnosis not present

## 2019-06-19 DIAGNOSIS — M79671 Pain in right foot: Secondary | ICD-10-CM | POA: Diagnosis not present

## 2019-06-21 DIAGNOSIS — E538 Deficiency of other specified B group vitamins: Secondary | ICD-10-CM | POA: Diagnosis not present

## 2019-06-23 DIAGNOSIS — M47817 Spondylosis without myelopathy or radiculopathy, lumbosacral region: Secondary | ICD-10-CM | POA: Diagnosis not present

## 2019-06-23 DIAGNOSIS — I10 Essential (primary) hypertension: Secondary | ICD-10-CM | POA: Diagnosis not present

## 2019-06-23 DIAGNOSIS — E782 Mixed hyperlipidemia: Secondary | ICD-10-CM | POA: Diagnosis not present

## 2019-06-23 DIAGNOSIS — E1140 Type 2 diabetes mellitus with diabetic neuropathy, unspecified: Secondary | ICD-10-CM | POA: Diagnosis not present

## 2019-06-23 DIAGNOSIS — R69 Illness, unspecified: Secondary | ICD-10-CM | POA: Diagnosis not present

## 2019-06-23 DIAGNOSIS — M47812 Spondylosis without myelopathy or radiculopathy, cervical region: Secondary | ICD-10-CM | POA: Diagnosis not present

## 2019-06-23 DIAGNOSIS — J439 Emphysema, unspecified: Secondary | ICD-10-CM | POA: Diagnosis not present

## 2019-06-23 DIAGNOSIS — E559 Vitamin D deficiency, unspecified: Secondary | ICD-10-CM | POA: Diagnosis not present

## 2019-06-23 DIAGNOSIS — E1151 Type 2 diabetes mellitus with diabetic peripheral angiopathy without gangrene: Secondary | ICD-10-CM | POA: Diagnosis not present

## 2019-06-23 DIAGNOSIS — E785 Hyperlipidemia, unspecified: Secondary | ICD-10-CM | POA: Diagnosis not present

## 2019-06-26 DIAGNOSIS — R69 Illness, unspecified: Secondary | ICD-10-CM | POA: Diagnosis not present

## 2019-06-26 DIAGNOSIS — E1151 Type 2 diabetes mellitus with diabetic peripheral angiopathy without gangrene: Secondary | ICD-10-CM | POA: Diagnosis not present

## 2019-06-26 DIAGNOSIS — J439 Emphysema, unspecified: Secondary | ICD-10-CM | POA: Diagnosis not present

## 2019-06-26 DIAGNOSIS — I10 Essential (primary) hypertension: Secondary | ICD-10-CM | POA: Diagnosis not present

## 2019-06-26 DIAGNOSIS — M47812 Spondylosis without myelopathy or radiculopathy, cervical region: Secondary | ICD-10-CM | POA: Diagnosis not present

## 2019-06-26 DIAGNOSIS — E782 Mixed hyperlipidemia: Secondary | ICD-10-CM | POA: Diagnosis not present

## 2019-06-26 DIAGNOSIS — E1140 Type 2 diabetes mellitus with diabetic neuropathy, unspecified: Secondary | ICD-10-CM | POA: Diagnosis not present

## 2019-06-26 DIAGNOSIS — E785 Hyperlipidemia, unspecified: Secondary | ICD-10-CM | POA: Diagnosis not present

## 2019-06-26 DIAGNOSIS — E559 Vitamin D deficiency, unspecified: Secondary | ICD-10-CM | POA: Diagnosis not present

## 2019-06-26 DIAGNOSIS — M47817 Spondylosis without myelopathy or radiculopathy, lumbosacral region: Secondary | ICD-10-CM | POA: Diagnosis not present

## 2019-06-28 DIAGNOSIS — E785 Hyperlipidemia, unspecified: Secondary | ICD-10-CM | POA: Diagnosis not present

## 2019-06-28 DIAGNOSIS — E559 Vitamin D deficiency, unspecified: Secondary | ICD-10-CM | POA: Diagnosis not present

## 2019-06-28 DIAGNOSIS — M47812 Spondylosis without myelopathy or radiculopathy, cervical region: Secondary | ICD-10-CM | POA: Diagnosis not present

## 2019-06-28 DIAGNOSIS — I10 Essential (primary) hypertension: Secondary | ICD-10-CM | POA: Diagnosis not present

## 2019-06-28 DIAGNOSIS — M47817 Spondylosis without myelopathy or radiculopathy, lumbosacral region: Secondary | ICD-10-CM | POA: Diagnosis not present

## 2019-06-28 DIAGNOSIS — J439 Emphysema, unspecified: Secondary | ICD-10-CM | POA: Diagnosis not present

## 2019-06-28 DIAGNOSIS — E782 Mixed hyperlipidemia: Secondary | ICD-10-CM | POA: Diagnosis not present

## 2019-06-28 DIAGNOSIS — E1140 Type 2 diabetes mellitus with diabetic neuropathy, unspecified: Secondary | ICD-10-CM | POA: Diagnosis not present

## 2019-06-28 DIAGNOSIS — R69 Illness, unspecified: Secondary | ICD-10-CM | POA: Diagnosis not present

## 2019-06-28 DIAGNOSIS — E1151 Type 2 diabetes mellitus with diabetic peripheral angiopathy without gangrene: Secondary | ICD-10-CM | POA: Diagnosis not present

## 2019-07-05 DIAGNOSIS — M47817 Spondylosis without myelopathy or radiculopathy, lumbosacral region: Secondary | ICD-10-CM | POA: Diagnosis not present

## 2019-07-05 DIAGNOSIS — I10 Essential (primary) hypertension: Secondary | ICD-10-CM | POA: Diagnosis not present

## 2019-07-05 DIAGNOSIS — E1151 Type 2 diabetes mellitus with diabetic peripheral angiopathy without gangrene: Secondary | ICD-10-CM | POA: Diagnosis not present

## 2019-07-05 DIAGNOSIS — M47812 Spondylosis without myelopathy or radiculopathy, cervical region: Secondary | ICD-10-CM | POA: Diagnosis not present

## 2019-07-05 DIAGNOSIS — E559 Vitamin D deficiency, unspecified: Secondary | ICD-10-CM | POA: Diagnosis not present

## 2019-07-05 DIAGNOSIS — E785 Hyperlipidemia, unspecified: Secondary | ICD-10-CM | POA: Diagnosis not present

## 2019-07-05 DIAGNOSIS — E782 Mixed hyperlipidemia: Secondary | ICD-10-CM | POA: Diagnosis not present

## 2019-07-05 DIAGNOSIS — R69 Illness, unspecified: Secondary | ICD-10-CM | POA: Diagnosis not present

## 2019-07-05 DIAGNOSIS — J439 Emphysema, unspecified: Secondary | ICD-10-CM | POA: Diagnosis not present

## 2019-07-05 DIAGNOSIS — E1140 Type 2 diabetes mellitus with diabetic neuropathy, unspecified: Secondary | ICD-10-CM | POA: Diagnosis not present

## 2019-07-12 DIAGNOSIS — M47812 Spondylosis without myelopathy or radiculopathy, cervical region: Secondary | ICD-10-CM | POA: Diagnosis not present

## 2019-07-12 DIAGNOSIS — E559 Vitamin D deficiency, unspecified: Secondary | ICD-10-CM | POA: Diagnosis not present

## 2019-07-12 DIAGNOSIS — M47817 Spondylosis without myelopathy or radiculopathy, lumbosacral region: Secondary | ICD-10-CM | POA: Diagnosis not present

## 2019-07-12 DIAGNOSIS — E1140 Type 2 diabetes mellitus with diabetic neuropathy, unspecified: Secondary | ICD-10-CM | POA: Diagnosis not present

## 2019-07-12 DIAGNOSIS — E1151 Type 2 diabetes mellitus with diabetic peripheral angiopathy without gangrene: Secondary | ICD-10-CM | POA: Diagnosis not present

## 2019-07-12 DIAGNOSIS — I10 Essential (primary) hypertension: Secondary | ICD-10-CM | POA: Diagnosis not present

## 2019-07-12 DIAGNOSIS — R69 Illness, unspecified: Secondary | ICD-10-CM | POA: Diagnosis not present

## 2019-07-12 DIAGNOSIS — E785 Hyperlipidemia, unspecified: Secondary | ICD-10-CM | POA: Diagnosis not present

## 2019-07-12 DIAGNOSIS — J439 Emphysema, unspecified: Secondary | ICD-10-CM | POA: Diagnosis not present

## 2019-07-12 DIAGNOSIS — E782 Mixed hyperlipidemia: Secondary | ICD-10-CM | POA: Diagnosis not present

## 2019-07-28 DIAGNOSIS — R69 Illness, unspecified: Secondary | ICD-10-CM | POA: Diagnosis not present

## 2019-08-29 DIAGNOSIS — R69 Illness, unspecified: Secondary | ICD-10-CM | POA: Diagnosis not present

## 2019-09-19 DIAGNOSIS — R200 Anesthesia of skin: Secondary | ICD-10-CM | POA: Diagnosis not present

## 2019-09-19 DIAGNOSIS — R202 Paresthesia of skin: Secondary | ICD-10-CM | POA: Diagnosis not present

## 2019-09-22 ENCOUNTER — Telehealth: Payer: Self-pay

## 2019-09-22 NOTE — Telephone Encounter (Signed)
Confirmed virtual visit with patient. klh 

## 2019-09-26 ENCOUNTER — Ambulatory Visit (INDEPENDENT_AMBULATORY_CARE_PROVIDER_SITE_OTHER): Payer: Medicare HMO | Admitting: Adult Health

## 2019-09-26 ENCOUNTER — Encounter: Payer: Self-pay | Admitting: Adult Health

## 2019-09-26 VITALS — Ht 69.0 in | Wt 170.0 lb

## 2019-09-26 DIAGNOSIS — N400 Enlarged prostate without lower urinary tract symptoms: Secondary | ICD-10-CM | POA: Diagnosis not present

## 2019-09-26 DIAGNOSIS — I10 Essential (primary) hypertension: Secondary | ICD-10-CM

## 2019-09-26 DIAGNOSIS — M62838 Other muscle spasm: Secondary | ICD-10-CM

## 2019-09-26 DIAGNOSIS — R69 Illness, unspecified: Secondary | ICD-10-CM | POA: Diagnosis not present

## 2019-09-26 DIAGNOSIS — F17200 Nicotine dependence, unspecified, uncomplicated: Secondary | ICD-10-CM | POA: Diagnosis not present

## 2019-09-26 DIAGNOSIS — J449 Chronic obstructive pulmonary disease, unspecified: Secondary | ICD-10-CM

## 2019-09-26 NOTE — Progress Notes (Signed)
[facility] [place], [place] [address]  Internal MEDICINE  Telephone Visit  Patient Name: Brian Key  [id number]  [id number]  Date of Service: 09/26/2019  I connected with the patient at 301 by telephone and verified the patients identity using two identifiers.   I discussed the limitations, risks, security and privacy concerns of performing an evaluation and management service by telephone and the availability of in person appointments. I also discussed with the patient that there may be a patient responsible charge related to the service.  The patient expressed understanding and agrees to proceed.    Chief Complaint  Patient presents with  . Telephone Assessment  . Telephone Screen  . Hyperlipidemia  . Hypertension    HPI  Pt is seen via telephone. He is following up on HTN, and HLD. He reports his blood pressure has been good.  He saw neuro last week, and his bp was 153/76.  He Denies Chest pain, Shortness of breath, palpitations, headache, or blurred vision. He has HLD and does not currently take a statin.  He was unable to tolerate statins in the past.    Current Medication: Outpatient Encounter Medications as of 09/26/2019  Medication Sig  . albuterol (ACCUNEB) 1.25 MG/3ML nebulizer solution Take 3 mLs (1.25 mg total) by nebulization every 6 (six) hours as needed for wheezing.  Marland Kitchen albuterol (VENTOLIN HFA) 108 (90 Base) MCG/ACT inhaler Inhale 2 puffs into the lungs every 6 (six) hours as needed for wheezing or shortness of breath.  . fluticasone furoate-vilanterol (BREO ELLIPTA) 100-25 MCG/INH AEPB INHALE 1 PUFF BY MOUTH ONCE DAILY  . gabapentin (NEURONTIN) 100 MG capsule Take 1 capsule (100 mg total) by mouth 3 (three) times daily.  . meloxicam (MOBIC) 15 MG tablet Take 1 tablet (15 mg total) by mouth daily.  . tamsulosin (FLOMAX) 0.4 MG CAPS capsule Take 1 capsule (0.4 mg total) by mouth daily.  Marland Kitchen tiZANidine (ZANAFLEX) 4 MG tablet Take 1 tablet (4  mg total) by mouth 2 (two) times daily as needed for muscle spasms.   No facility-administered encounter medications on file as of 09/26/2019.    Surgical History: Past Surgical History:  Procedure Laterality Date  . ANKLE SURGERY  2009  . [place]  . HERNIA REPAIR  [id number]   umbilical  . LIPOMA EXCISION  02/16/2017   back/ Dr Bary Castilla  . NECK SURGERY    . WRIST SURGERY Left 2007    Medical History: Past Medical History:  Diagnosis Date  . Back problem   . COPD (chronic obstructive pulmonary disease) ([place])   . Hyperlipidemia     Family History: Family History  Problem Relation Age of Onset  . Prostate cancer Neg Hx   . Bladder Cancer Neg Hx   . Kidney cancer Neg Hx     Social History   Socioeconomic History  . Marital status: Married    Spouse name: Not on file  . Number of children: Not on file  . Years of education: Not on file  . Highest education level: Not on file  Occupational History  . Not on file  Tobacco Use  . Smoking status: Current Every Day Smoker    Packs/day: 1.00    Years: 50.00    Pack years: 50.00    Types: Cigarettes  . Smokeless tobacco: Never Used  Substance and Sexual Activity  . Alcohol use: Yes    Comment: occasionally  . Drug use: No  . Sexual  activity: Not on file  Other Topics Concern  . Not on file  Social History Narrative  . Not on file   Social Determinants of Health   Financial Resource Strain:   . Difficulty of Paying Living Expenses: Not on file  Food Insecurity:   . Worried About Charity fundraiser in the Last Year: Not on file  . Ran Out of Food in the Last Year: Not on file  Transportation Needs:   . Lack of Transportation (Medical): Not on file  . Lack of Transportation (Non-Medical): Not on file  Physical Activity:   . Days of Exercise per Week: Not on file  . Minutes of Exercise per Session: Not on file  Stress:   . Feeling of Stress : Not on file  Social Connections:   . Frequency of  Communication with Friends and Family: Not on file  . Frequency of Social Gatherings with Friends and Family: Not on file  . Attends Religious Services: Not on file  . Active Member of Clubs or Organizations: Not on file  . Attends Archivist Meetings: Not on file  . Marital Status: Not on file  Intimate Partner Violence:   . Fear of Current or Ex-Partner: Not on file  . Emotionally Abused: Not on file  . Physically Abused: Not on file  . Sexually Abused: Not on file      Review of Systems  Constitutional: Negative.  Negative for chills, fatigue and unexpected weight change.  HENT: Negative.  Negative for congestion, rhinorrhea, sneezing and sore throat.   Eyes: Negative for redness.  Respiratory: Negative.  Negative for cough, chest tightness and shortness of breath.   Cardiovascular: Negative.  Negative for chest pain and palpitations.  Gastrointestinal: Negative.  Negative for abdominal pain, constipation, diarrhea, nausea and vomiting.  Endocrine: Negative.   Genitourinary: Negative.  Negative for dysuria and frequency.  Musculoskeletal: Negative.  Negative for arthralgias, back pain, joint swelling and neck pain.  Skin: Negative.  Negative for rash.  Allergic/Immunologic: Negative.   Neurological: Negative.  Negative for tremors and numbness.  Hematological: Negative for adenopathy. Does not bruise/bleed easily.  Psychiatric/Behavioral: Negative.  Negative for behavioral problems, sleep disturbance and suicidal ideas. The patient is not nervous/anxious.     Vital Signs: Ht 5\' 9"  (1.753 m)   Wt 170 lb (77.1 kg)   BMI 25.10 kg/m    Observation/Objective:  Well sounding, NAD noted at this time.    Assessment/Plan: 1. Essential hypertension Stable, is not currently medicated.  2. Chronic obstructive pulmonary disease, unspecified COPD type ([place]) Stable, continue present management. Unfortunately continues to smoke.   3. Nicotine dependence with current  use Smoking cessation counseling: 1. Pt acknowledges the risks of long term smoking, she will try to quite smoking. 2. Options for different medications including nicotine products, chewing gum, patch etc, Wellbutrin and Chantix is discussed 3. Goal and date of compete cessation is discussed 4. Total time spent in smoking cessation is 15 min.  4. Benign prostatic hyperplasia without lower urinary tract symptoms Continue with flomax as prescribed.   5. Muscle spasm Continues to have intermittent issues.  Continue zanaflex as directed.   General Counseling: damare ury understanding of the findings of today's phone visit and agrees with plan of treatment. I have discussed any further diagnostic evaluation that may be needed or ordered today. We also reviewed his medications today. he has been encouraged to call the office with any questions or concerns that should arise related  to todays visit.    No orders of the defined types were placed in this encounter.   No orders of the defined types were placed in this encounter.   Time spent: [facility] Internal medicine

## 2019-11-13 ENCOUNTER — Other Ambulatory Visit: Payer: Self-pay

## 2019-11-13 ENCOUNTER — Other Ambulatory Visit: Payer: Self-pay | Admitting: Adult Health

## 2019-11-13 DIAGNOSIS — M62838 Other muscle spasm: Secondary | ICD-10-CM

## 2019-11-13 DIAGNOSIS — I739 Peripheral vascular disease, unspecified: Secondary | ICD-10-CM

## 2019-11-13 DIAGNOSIS — M461 Sacroiliitis, not elsewhere classified: Secondary | ICD-10-CM

## 2019-11-13 MED ORDER — TIZANIDINE HCL 4 MG PO TABS: 4.0000 mg | ORAL_TABLET | Freq: Two times a day (BID) | ORAL | 1 refills | Status: DC | PRN

## 2020-01-02 DIAGNOSIS — R252 Cramp and spasm: Secondary | ICD-10-CM | POA: Insufficient documentation

## 2020-01-02 DIAGNOSIS — R202 Paresthesia of skin: Secondary | ICD-10-CM | POA: Diagnosis not present

## 2020-01-02 DIAGNOSIS — R200 Anesthesia of skin: Secondary | ICD-10-CM | POA: Diagnosis not present

## 2020-01-19 ENCOUNTER — Encounter: Payer: Self-pay | Admitting: Urology

## 2020-01-30 ENCOUNTER — Telehealth: Payer: Self-pay

## 2020-01-30 NOTE — Telephone Encounter (Signed)
Called lmom informing patient of appointment on 02/01/2020. klh

## 2020-02-01 ENCOUNTER — Ambulatory Visit (INDEPENDENT_AMBULATORY_CARE_PROVIDER_SITE_OTHER): Payer: Medicare HMO | Admitting: Adult Health

## 2020-02-01 ENCOUNTER — Encounter: Payer: Self-pay | Admitting: Adult Health

## 2020-02-01 ENCOUNTER — Other Ambulatory Visit: Payer: Self-pay

## 2020-02-01 VITALS — BP 140/80 | HR 72 | Temp 97.5°F | Resp 16 | Ht 69.0 in | Wt 163.2 lb

## 2020-02-01 DIAGNOSIS — Z0001 Encounter for general adult medical examination with abnormal findings: Secondary | ICD-10-CM | POA: Diagnosis not present

## 2020-02-01 DIAGNOSIS — J449 Chronic obstructive pulmonary disease, unspecified: Secondary | ICD-10-CM

## 2020-02-01 DIAGNOSIS — Z79899 Other long term (current) drug therapy: Secondary | ICD-10-CM

## 2020-02-01 DIAGNOSIS — E785 Hyperlipidemia, unspecified: Secondary | ICD-10-CM | POA: Diagnosis not present

## 2020-02-01 DIAGNOSIS — F17200 Nicotine dependence, unspecified, uncomplicated: Secondary | ICD-10-CM | POA: Diagnosis not present

## 2020-02-01 DIAGNOSIS — N400 Enlarged prostate without lower urinary tract symptoms: Secondary | ICD-10-CM | POA: Diagnosis not present

## 2020-02-01 DIAGNOSIS — I10 Essential (primary) hypertension: Secondary | ICD-10-CM | POA: Diagnosis not present

## 2020-02-01 DIAGNOSIS — I739 Peripheral vascular disease, unspecified: Secondary | ICD-10-CM | POA: Diagnosis not present

## 2020-02-01 DIAGNOSIS — R69 Illness, unspecified: Secondary | ICD-10-CM | POA: Diagnosis not present

## 2020-02-01 NOTE — Progress Notes (Signed)
[facility] [place], [place] [address]  Internal MEDICINE  Office Visit Note  Patient Name: Brian Key  [id number]  [id number]  Date of Service: 02/01/2020  Chief Complaint  Patient presents with  . Medicare Wellness    feet swelling,  . Hyperlipidemia     HPI Pt is here for routine health maintenance examination.  He is well appearing 74 yo male.  He has a history of HTN, HLD, COPD, BPH.  His blood pressure is controlled at this time.  His copd is currently treated with Breo.  His BPH is treated by urology with flomax. He has an appt next week.   Smokes 1 PPD, He drinks about 1 Pint of alcohol monthly, Smokes marijuana daily.    He complains of some bilateral foot swelling.   Current Medication: Outpatient Encounter Medications as of 02/01/2020  Medication Sig  . albuterol (ACCUNEB) 1.25 MG/3ML nebulizer solution Take 3 mLs (1.25 mg total) by nebulization every 6 (six) hours as needed for wheezing.  Marland Kitchen albuterol (VENTOLIN HFA) 108 (90 Base) MCG/ACT inhaler Inhale 2 puffs into the lungs every 6 (six) hours as needed for wheezing or shortness of breath.  . Baclofen 5 MG TABS Take 5 tablets by mouth in the morning, at noon, and at bedtime.  . fluticasone furoate-vilanterol (BREO ELLIPTA) 100-25 MCG/INH AEPB INHALE 1 PUFF BY MOUTH ONCE DAILY  . gabapentin (NEURONTIN) 100 MG capsule Take 1 capsule (100 mg total) by mouth 3 (three) times daily.  . meloxicam (MOBIC) 15 MG tablet TAKE 1 TABLET BY MOUTH ONCE DAILY  . tamsulosin (FLOMAX) 0.4 MG CAPS capsule Take 1 capsule (0.4 mg total) by mouth daily.  Marland Kitchen tiZANidine (ZANAFLEX) 4 MG tablet Take 1 tablet (4 mg total) by mouth 2 (two) times daily as needed for muscle spasms.   No facility-administered encounter medications on file as of 02/01/2020.    Surgical History: Past Surgical History:  Procedure Laterality Date  . ANKLE SURGERY  2009  . [place]  . HERNIA REPAIR  0263   umbilical   . LIPOMA EXCISION  02/16/2017   back/ Dr Bary Castilla  . NECK SURGERY    . WRIST SURGERY Left 2007    Medical History: Past Medical History:  Diagnosis Date  . Back problem   . COPD (chronic obstructive pulmonary disease) ([place])   . Hyperlipidemia     Family History: Family History  Problem Relation Age of Onset  . Prostate cancer Neg Hx   . Bladder Cancer Neg Hx   . Kidney cancer Neg Hx       Review of Systems  Constitutional: Negative.  Negative for chills, fatigue and unexpected weight change.  HENT: Negative.  Negative for congestion, rhinorrhea, sneezing and sore throat.   Eyes: Negative for redness.  Respiratory: Negative.  Negative for cough, chest tightness and shortness of breath.   Cardiovascular: Negative.  Negative for chest pain and palpitations.  Gastrointestinal: Negative.  Negative for abdominal pain, constipation, diarrhea, nausea and vomiting.  Endocrine: Negative.   Genitourinary: Negative.  Negative for dysuria and frequency.  Musculoskeletal: Negative.  Negative for arthralgias, back pain, joint swelling and neck pain.  Skin: Negative.  Negative for rash.  Allergic/Immunologic: Negative.   Neurological: Negative.  Negative for tremors and numbness.  Hematological: Negative for adenopathy. Does not bruise/bleed easily.  Psychiatric/Behavioral: Negative.  Negative for behavioral problems, sleep disturbance and suicidal ideas. The patient is not nervous/anxious.      Vital  Signs: BP 140/80   Pulse 72   Temp (!) 97.5 F (36.4 C)   Resp 16   Ht 5\' 9"  (1.753 m)   Wt 163 lb 3.2 oz (74 kg)   SpO2 96%   BMI 24.10 kg/m    Physical Exam Vitals and nursing note reviewed.  Constitutional:      General: He is not in acute distress.    Appearance: He is well-developed. He is not diaphoretic.  HENT:     Head: Normocephalic and atraumatic.     Mouth/Throat:     Pharynx: No oropharyngeal exudate.  Eyes:     Pupils: Pupils are equal, round, and reactive  to light.  Neck:     Thyroid: No thyromegaly.     Vascular: No JVD.     Trachea: No tracheal deviation.  Cardiovascular:     Rate and Rhythm: Normal rate and regular rhythm.     Heart sounds: Normal heart sounds. No murmur heard.  No friction rub. No gallop.   Pulmonary:     Effort: Pulmonary effort is normal. No respiratory distress.     Breath sounds: Normal breath sounds. No wheezing or rales.  Chest:     Chest wall: No tenderness.  Abdominal:     Palpations: Abdomen is soft.     Tenderness: There is no abdominal tenderness. There is no guarding.  Musculoskeletal:        General: Normal range of motion.     Cervical back: Normal range of motion and neck supple.  Lymphadenopathy:     Cervical: No cervical adenopathy.  Skin:    General: Skin is warm and dry.  Neurological:     Mental Status: He is alert and oriented to person, place, and time.     Cranial Nerves: No cranial nerve deficit.  Psychiatric:        Behavior: Behavior normal.        Thought Content: Thought content normal.        Judgment: Judgment normal.      LABS: No results found for this or any previous visit (from the past 2160 hour(s)).   Assessment/Plan: 1. Encounter for general adult medical examination with abnormal findings Up to date on PHM - CBC with Differential/Platelet - Lipid Panel With LDL/HDL Ratio - TSH - T4, free - Comprehensive metabolic panel  2. Essential hypertension Stable, continue current medications.   3. Chronic obstructive pulmonary disease, unspecified COPD type ([place]) Stable, continue  - DG Chest 2 View; Future  4. Nicotine dependence with current use Smoking cessation counseling: 1. Pt acknowledges the risks of long term smoking, she will try to quite smoking. 2. Options for different medications including nicotine products, chewing gum, patch etc, Wellbutrin and Chantix is discussed 3. Goal and date of compete cessation is discussed 4. Total time spent in smoking  cessation is 15 min.  - DG Chest 2 View; Future  5. Claudication [facility]) Continue present management.   6. Benign prostatic hyperplasia without lower urinary tract symptoms Stable, continue to see urology and use flomax as directed.   7. Hyperlipidemia, unspecified hyperlipidemia type Repeat lipid panel.   8. Long-term use of high-risk medication - CBC with Differential/Platelet - Lipid Panel With LDL/HDL Ratio - TSH - T4, free - Comprehensive metabolic panel   General Counseling: Conn verbalizes understanding of the findings of todays visit and agrees with plan of treatment. I have discussed any further diagnostic evaluation that may be needed or ordered today. We also  reviewed his medications today. he has been encouraged to call the office with any questions or concerns that should arise related to todays visit.   Orders Placed This Encounter  Procedures  . CBC with Differential/Platelet  . Lipid Panel With LDL/HDL Ratio  . TSH  . T4, free  . Comprehensive metabolic panel    No orders of the defined types were placed in this encounter.   Time spent: 30 Minutes   This patient was seen by Orson Gear AGNP-C in Collaboration with Dr Lavera Guise as a part of collaborative care agreement    Kendell Bane AGNP-C Internal Medicine

## 2020-02-02 ENCOUNTER — Ambulatory Visit
Admission: RE | Admit: 2020-02-02 | Discharge: 2020-02-02 | Disposition: A | Discharge status: Home / Self Care | Payer: Medicare HMO | Source: Ambulatory Visit | Attending: Adult Health | Admitting: Adult Health

## 2020-02-02 DIAGNOSIS — R69 Illness, unspecified: Secondary | ICD-10-CM | POA: Diagnosis not present

## 2020-02-02 DIAGNOSIS — J449 Chronic obstructive pulmonary disease, unspecified: Secondary | ICD-10-CM

## 2020-02-02 DIAGNOSIS — F17200 Nicotine dependence, unspecified, uncomplicated: Secondary | ICD-10-CM | POA: Diagnosis present

## 2020-02-02 DIAGNOSIS — Z79899 Other long term (current) drug therapy: Secondary | ICD-10-CM | POA: Diagnosis not present

## 2020-02-02 DIAGNOSIS — Z0001 Encounter for general adult medical examination with abnormal findings: Secondary | ICD-10-CM | POA: Diagnosis not present

## 2020-02-03 LAB — CBC WITH DIFFERENTIAL/PLATELET
Basophils Absolute: 0.1 10*3/uL (ref 0.0–0.2)
Basos: 1 %
EOS (ABSOLUTE): 0.2 10*3/uL (ref 0.0–0.4)
Eos: 2 %
Hematocrit: 51.4 % — ABNORMAL HIGH (ref 37.5–51.0)
Hemoglobin: 17.8 g/dL — ABNORMAL HIGH (ref 13.0–17.7)
Immature Grans (Abs): 0 10*3/uL (ref 0.0–0.1)
Immature Granulocytes: 0 %
Lymphocytes Absolute: 2.5 10*3/uL (ref 0.7–3.1)
Lymphs: 30 %
MCH: 32.5 pg (ref 26.6–33.0)
MCHC: 34.6 g/dL (ref 31.5–35.7)
MCV: 94 fL (ref 79–97)
Monocytes Absolute: 0.7 10*3/uL (ref 0.1–0.9)
Monocytes: 9 %
Neutrophils Absolute: 4.9 10*3/uL (ref 1.4–7.0)
Neutrophils: 58 %
Platelets: 185 10*3/uL (ref 150–450)
RBC: 5.48 x10E6/uL (ref 4.14–5.80)
RDW: 13 % (ref 11.6–15.4)
WBC: 8.4 10*3/uL (ref 3.4–10.8)

## 2020-02-03 LAB — COMPREHENSIVE METABOLIC PANEL
ALT: 7 IU/L (ref 0–44)
AST: 9 IU/L (ref 0–40)
Albumin/Globulin Ratio: 2.3 — ABNORMAL HIGH (ref 1.2–2.2)
Albumin: 4.2 g/dL (ref 3.7–4.7)
Alkaline Phosphatase: 102 IU/L (ref 48–121)
BUN/Creatinine Ratio: 18 (ref 10–24)
BUN: 20 mg/dL (ref 8–27)
Bilirubin Total: 0.4 mg/dL (ref 0.0–1.2)
CO2: 23 mmol/L (ref 20–29)
Calcium: 9.4 mg/dL (ref 8.6–10.2)
Chloride: 103 mmol/L (ref 96–106)
Creatinine, Ser: 1.11 mg/dL (ref 0.76–1.27)
GFR calc Af Amer: 76 mL/min/{1.73_m2} (ref >59)
GFR calc non Af Amer: 66 mL/min/{1.73_m2} (ref >59)
Globulin, Total: 1.8 g/dL (ref 1.5–4.5)
Glucose: 129 mg/dL — ABNORMAL HIGH (ref 65–99)
Potassium: 4.8 mmol/L (ref 3.5–5.2)
Sodium: 140 mmol/L (ref 134–144)
Total Protein: 6 g/dL (ref 6.0–8.5)

## 2020-02-03 LAB — TSH: TSH: 1.01 u[IU]/mL (ref 0.450–4.500)

## 2020-02-03 LAB — LIPID PANEL WITH LDL/HDL RATIO
Cholesterol, Total: 242 mg/dL — ABNORMAL HIGH (ref 100–199)
HDL: 39 mg/dL — ABNORMAL LOW (ref >39)
LDL Chol Calc (NIH): 182 mg/dL — ABNORMAL HIGH (ref 0–99)
LDL/HDL Ratio: 4.7 ratio — ABNORMAL HIGH (ref 0.0–3.6)
Triglycerides: 116 mg/dL (ref 0–149)
VLDL Cholesterol Cal: 21 mg/dL (ref 5–40)

## 2020-02-03 LAB — T4, FREE: Free T4: 1.34 ng/dL (ref 0.82–1.77)

## 2020-02-20 ENCOUNTER — Telehealth: Payer: Self-pay

## 2020-02-20 NOTE — Telephone Encounter (Signed)
Confirmed appointment on 02/22/2020 and screened for covid. klh 

## 2020-02-21 ENCOUNTER — Encounter: Payer: Self-pay | Admitting: Urology

## 2020-02-21 ENCOUNTER — Other Ambulatory Visit: Payer: Self-pay

## 2020-02-21 ENCOUNTER — Ambulatory Visit (INDEPENDENT_AMBULATORY_CARE_PROVIDER_SITE_OTHER): Payer: Medicare HMO | Admitting: Urology

## 2020-02-21 VITALS — BP 128/64 | HR 67 | Ht 69.0 in | Wt 170.0 lb

## 2020-02-21 DIAGNOSIS — N400 Enlarged prostate without lower urinary tract symptoms: Secondary | ICD-10-CM | POA: Diagnosis not present

## 2020-02-21 LAB — BLADDER SCAN AMB NON-IMAGING

## 2020-02-21 MED ORDER — TAMSULOSIN HCL 0.4 MG PO CAPS: 0.4000 mg | ORAL_CAPSULE | Freq: Every day | ORAL | 3 refills | Status: DC

## 2020-02-21 NOTE — Progress Notes (Signed)
   02/21/2020 9:55 AM   Brian Key May 17, 1946 [id number]  Reason for visit: Follow up BPH  HPI: I saw Brian Key in urology clinic today in follow-up for urinary symptoms.  He was previously followed by Dr. Pilar Jarvis.  He is a 74 year old male whose been followed long-term for BPH and intermittent urinary symptoms.  He has previously had some burning with urination after a bout of prostatitis, however follow-up cystoscopy with Dr. Pilar Jarvis was normal.  He has been on Flomax 0.4 mg nightly long-term which he feels significantly improved his symptoms.  He really denies any complaints about his urination today and is overall doing very well.  He denies any UTIs, retention, or gross hematuria since her last visit.    Last PSA in June 2020 was normal and stable at 1.1. At our last visit, we had discussed the AUA guidelines regarding discontinuing PSA screening in men over age 63, and he was in agreement to stop PSA screening. Prostate measures 58 g on CT in 2016.  PVR in clinic today normal at 5 mL.  He would like to continue Flomax for his stable BPH.  Continue Flomax for BPH symptoms  Billey Co, MD  [facility] [address], [place], [place] [address] [phone]

## 2020-02-21 NOTE — Patient Instructions (Signed)

## 2020-02-22 ENCOUNTER — Ambulatory Visit (INDEPENDENT_AMBULATORY_CARE_PROVIDER_SITE_OTHER): Payer: Medicare HMO | Admitting: Adult Health

## 2020-02-22 ENCOUNTER — Encounter: Payer: Self-pay | Admitting: Adult Health

## 2020-02-22 ENCOUNTER — Ambulatory Visit: Payer: Medicare HMO | Admitting: Urology

## 2020-02-22 VITALS — BP 108/58 | HR 61 | Temp 97.5°F | Resp 16 | Ht 69.0 in | Wt 162.0 lb

## 2020-02-22 DIAGNOSIS — R69 Illness, unspecified: Secondary | ICD-10-CM | POA: Diagnosis not present

## 2020-02-22 DIAGNOSIS — T466X5A Adverse effect of antihyperlipidemic and antiarteriosclerotic drugs, initial encounter: Secondary | ICD-10-CM

## 2020-02-22 DIAGNOSIS — I739 Peripheral vascular disease, unspecified: Secondary | ICD-10-CM

## 2020-02-22 DIAGNOSIS — J449 Chronic obstructive pulmonary disease, unspecified: Secondary | ICD-10-CM | POA: Diagnosis not present

## 2020-02-22 DIAGNOSIS — I10 Essential (primary) hypertension: Secondary | ICD-10-CM | POA: Diagnosis not present

## 2020-02-22 DIAGNOSIS — R7301 Impaired fasting glucose: Secondary | ICD-10-CM

## 2020-02-22 DIAGNOSIS — N400 Enlarged prostate without lower urinary tract symptoms: Secondary | ICD-10-CM

## 2020-02-22 DIAGNOSIS — F17200 Nicotine dependence, unspecified, uncomplicated: Secondary | ICD-10-CM

## 2020-02-22 LAB — POCT GLYCOSYLATED HEMOGLOBIN (HGB A1C): Hemoglobin A1C: 6 % — AB (ref 4.0–5.6)

## 2020-02-22 NOTE — Progress Notes (Signed)
[facility] [place], [place] [address]  Internal MEDICINE  Office Visit Note  Patient Name: Brian Key  [id number]  [id number]  Date of Service: 02/22/2020  Chief Complaint  Patient presents with   Follow-up   Hyperlipidemia    HPI  Pt is here for follow up on HTN, copd, BPH and claudication.  He reports overall he is doing well. Since our last visit he had labs drawn, and had a CXR.  His CXR showed COPD, and no other issues.  His labs showed some elevated cholesterol.  He reports having a reaction to statin, and does not want to take any of those. His glucose was 129 on blood draw.      Current Medication: Outpatient Encounter Medications as of 02/22/2020  Medication Sig   albuterol (ACCUNEB) 1.25 MG/3ML nebulizer solution Take 3 mLs (1.25 mg total) by nebulization every 6 (six) hours as needed for wheezing.   albuterol (VENTOLIN HFA) 108 (90 Base) MCG/ACT inhaler Inhale 2 puffs into the lungs every 6 (six) hours as needed for wheezing or shortness of breath.   baclofen (LIORESAL) 10 MG tablet    Baclofen 5 MG TABS Take 5 tablets by mouth in the morning, at noon, and at bedtime.   fluticasone furoate-vilanterol (BREO ELLIPTA) 100-25 MCG/INH AEPB INHALE 1 PUFF BY MOUTH ONCE DAILY   gabapentin (NEURONTIN) 300 MG capsule Take 300 mg by mouth 3 (three) times daily.   meloxicam (MOBIC) 15 MG tablet TAKE 1 TABLET BY MOUTH ONCE DAILY   tamsulosin (FLOMAX) 0.4 MG CAPS capsule Take 1 capsule (0.4 mg total) by mouth daily.   tiZANidine (ZANAFLEX) 4 MG capsule    tiZANidine (ZANAFLEX) 4 MG tablet Take 1 tablet (4 mg total) by mouth 2 (two) times daily as needed for muscle spasms.   No facility-administered encounter medications on file as of 02/22/2020.    Surgical History: Past Surgical History:  Procedure Laterality Date   ANKLE SURGERY  2009   [place]   HERNIA REPAIR  0626   umbilical   LIPOMA EXCISION  02/16/2017    back/ Dr Bary Castilla   NECK SURGERY     WRIST SURGERY Left 2007    Medical History: Past Medical History:  Diagnosis Date   Back problem    COPD (chronic obstructive pulmonary disease) ([place])    Hyperlipidemia     Family History: Family History  Problem Relation Age of Onset   Prostate cancer Neg Hx    Bladder Cancer Neg Hx    Kidney cancer Neg Hx     Social History   Socioeconomic History   Marital status: Married    Spouse name: Not on file   Number of children: Not on file   Years of education: Not on file   Highest education level: Not on file  Occupational History   Not on file  Tobacco Use   Smoking status: Current Every Day Smoker    Packs/day: 1.00    Years: 50.00    Pack years: 50.00    Types: Cigarettes   Smokeless tobacco: Never Used  Substance and Sexual Activity   Alcohol use: Yes    Comment: occasionally   Drug use: Yes    Types: Marijuana   Sexual activity: Yes    Birth control/protection: None  Other Topics Concern   Not on file  Social History Narrative   Not on file   Social Determinants of Health   Financial Resource Strain:  Difficulty of Paying Living Expenses:   Food Insecurity:    Worried About Charity fundraiser in the Last Year:    Arboriculturist in the Last Year:   Transportation Needs:    Film/video editor (Medical):    Lack of Transportation (Non-Medical):   Physical Activity:    Days of Exercise per Week:    Minutes of Exercise per Session:   Stress:    Feeling of Stress :   Social Connections:    Frequency of Communication with Friends and Family:    Frequency of Social Gatherings with Friends and Family:    Attends Religious Services:    Active Member of Clubs or Organizations:    Attends Music therapist:    Marital Status:   Intimate Partner Violence:    Fear of Current or Ex-Partner:    Emotionally Abused:    Physically Abused:    Sexually Abused:        Review of Systems  Constitutional: Negative.  Negative for chills, fatigue and unexpected weight change.  HENT: Negative.  Negative for congestion, rhinorrhea, sneezing and sore throat.   Eyes: Negative for redness.  Respiratory: Negative.  Negative for cough, chest tightness and shortness of breath.   Cardiovascular: Negative.  Negative for chest pain and palpitations.  Gastrointestinal: Negative.  Negative for abdominal pain, constipation, diarrhea, nausea and vomiting.  Endocrine: Negative.   Genitourinary: Negative.  Negative for dysuria and frequency.  Musculoskeletal: Negative.  Negative for arthralgias, back pain, joint swelling and neck pain.  Skin: Negative.  Negative for rash.  Allergic/Immunologic: Negative.   Neurological: Negative.  Negative for tremors and numbness.  Hematological: Negative for adenopathy. Does not bruise/bleed easily.  Psychiatric/Behavioral: Negative.  Negative for behavioral problems, sleep disturbance and suicidal ideas. The patient is not nervous/anxious.     Vital Signs: BP (!) 108/58    Pulse 61    Temp (!) 97.5 F (36.4 C)    Resp 16    Ht 5\' 9"  (1.753 m)    Wt 162 lb (73.5 kg)    SpO2 96%    BMI 23.92 kg/m    Physical Exam Vitals and nursing note reviewed.  Constitutional:      General: He is not in acute distress.    Appearance: He is well-developed. He is not diaphoretic.  HENT:     Head: Normocephalic and atraumatic.     Mouth/Throat:     Pharynx: No oropharyngeal exudate.  Eyes:     Pupils: Pupils are equal, round, and reactive to light.  Neck:     Thyroid: No thyromegaly.     Vascular: No JVD.     Trachea: No tracheal deviation.  Cardiovascular:     Rate and Rhythm: Normal rate and regular rhythm.     Heart sounds: Normal heart sounds. No murmur heard.  No friction rub. No gallop.   Pulmonary:     Effort: Pulmonary effort is normal. No respiratory distress.     Breath sounds: Normal breath sounds. No wheezing or rales.   Chest:     Chest wall: No tenderness.  Abdominal:     Palpations: Abdomen is soft.     Tenderness: There is no abdominal tenderness. There is no guarding.  Musculoskeletal:        General: Normal range of motion.     Cervical back: Normal range of motion and neck supple.  Lymphadenopathy:     Cervical: No cervical adenopathy.  Skin:  General: Skin is warm and dry.  Neurological:     Mental Status: He is alert and oriented to person, place, and time.     Cranial Nerves: No cranial nerve deficit.  Psychiatric:        Behavior: Behavior normal.        Thought Content: Thought content normal.        Judgment: Judgment normal.     Assessment/Plan: 1. Essential hypertension BP 108/58 today. Continue present management.   2. Chronic obstructive pulmonary disease, unspecified COPD type ([place]) Stable, continue current management.  Pt unfortunately continues to smoke.   3. Claudication ([place]) Intermittent symptoms, continue current management.   4. Benign prostatic hyperplasia without lower urinary tract symptoms Continue present management with flomax  5. Nicotine dependence with current use Smoking cessation counseling: 1. Pt acknowledges the risks of long term smoking, she will try to quite smoking. 2. Options for different medications including nicotine products, chewing gum, patch etc, Wellbutrin and Chantix is discussed 3. Goal and date of compete cessation is discussed 4. Total time spent in smoking cessation is 15 min.   6. Adverse reaction to statin medication Declined statin therapy.   7. Impaired fasting glucose A1C today is 6.0.  Discussed importance of managing lifesytle and diet.  - POCT HgB A1C   General Counseling: Jonavon verbalizes understanding of the findings of todays visit and agrees with plan of treatment. I have discussed any further diagnostic evaluation that may be needed or ordered today. We also reviewed his medications today. he has been  encouraged to call the office with any questions or concerns that should arise related to todays visit.    No orders of the defined types were placed in this encounter.   No orders of the defined types were placed in this encounter.   Time spent: 30 Minutes   This patient was seen by Orson Gear AGNP-C in Collaboration with Dr Lavera Guise as a part of collaborative care agreement     Kendell Bane AGNP-C Internal medicine

## 2020-03-13 ENCOUNTER — Other Ambulatory Visit: Payer: Self-pay | Admitting: Adult Health

## 2020-03-13 DIAGNOSIS — J449 Chronic obstructive pulmonary disease, unspecified: Secondary | ICD-10-CM

## 2020-05-07 DIAGNOSIS — R202 Paresthesia of skin: Secondary | ICD-10-CM | POA: Diagnosis not present

## 2020-05-07 DIAGNOSIS — R252 Cramp and spasm: Secondary | ICD-10-CM | POA: Diagnosis not present

## 2020-05-07 DIAGNOSIS — R200 Anesthesia of skin: Secondary | ICD-10-CM | POA: Diagnosis not present

## 2020-06-27 ENCOUNTER — Other Ambulatory Visit: Payer: Self-pay

## 2020-06-27 DIAGNOSIS — J449 Chronic obstructive pulmonary disease, unspecified: Secondary | ICD-10-CM

## 2020-06-27 DIAGNOSIS — M461 Sacroiliitis, not elsewhere classified: Secondary | ICD-10-CM

## 2020-06-27 MED ORDER — MELOXICAM 15 MG PO TABS: 15.0000 mg | ORAL_TABLET | Freq: Every day | ORAL | 1 refills | Status: DC

## 2020-06-27 MED ORDER — BREO ELLIPTA 100-25 MCG/INH IN AEPB: INHALATION_SPRAY | RESPIRATORY_TRACT | 1 refills | Status: DC

## 2020-06-28 ENCOUNTER — Other Ambulatory Visit: Payer: Self-pay

## 2020-06-28 DIAGNOSIS — M62838 Other muscle spasm: Secondary | ICD-10-CM

## 2020-06-28 MED ORDER — TIZANIDINE HCL 4 MG PO TABS: 4.0000 mg | ORAL_TABLET | Freq: Two times a day (BID) | ORAL | 1 refills | Status: DC | PRN

## 2020-08-22 ENCOUNTER — Ambulatory Visit (INDEPENDENT_AMBULATORY_CARE_PROVIDER_SITE_OTHER): Payer: Medicare HMO | Admitting: Hospice and Palliative Medicine

## 2020-08-22 ENCOUNTER — Encounter: Payer: Self-pay | Admitting: Hospice and Palliative Medicine

## 2020-08-22 VITALS — BP 162/52 | HR 67 | Temp 97.3°F | Resp 16 | Ht 69.0 in | Wt 164.0 lb

## 2020-08-22 DIAGNOSIS — E1165 Type 2 diabetes mellitus with hyperglycemia: Secondary | ICD-10-CM | POA: Diagnosis not present

## 2020-08-22 DIAGNOSIS — R7301 Impaired fasting glucose: Secondary | ICD-10-CM | POA: Diagnosis not present

## 2020-08-22 DIAGNOSIS — E782 Mixed hyperlipidemia: Secondary | ICD-10-CM

## 2020-08-22 DIAGNOSIS — Z789 Other specified health status: Secondary | ICD-10-CM | POA: Diagnosis not present

## 2020-08-22 DIAGNOSIS — R69 Illness, unspecified: Secondary | ICD-10-CM | POA: Diagnosis not present

## 2020-08-22 DIAGNOSIS — M5442 Lumbago with sciatica, left side: Secondary | ICD-10-CM

## 2020-08-22 DIAGNOSIS — G8929 Other chronic pain: Secondary | ICD-10-CM

## 2020-08-22 DIAGNOSIS — I10 Essential (primary) hypertension: Secondary | ICD-10-CM | POA: Diagnosis not present

## 2020-08-22 DIAGNOSIS — F17200 Nicotine dependence, unspecified, uncomplicated: Secondary | ICD-10-CM

## 2020-08-22 DIAGNOSIS — M5441 Lumbago with sciatica, right side: Secondary | ICD-10-CM | POA: Diagnosis not present

## 2020-08-22 LAB — POCT GLYCOSYLATED HEMOGLOBIN (HGB A1C): Hemoglobin A1C: 5.7 % — AB (ref 4.0–5.6)

## 2020-08-22 MED ORDER — TIZANIDINE HCL 4 MG PO TABS: 4.0000 mg | ORAL_TABLET | Freq: Two times a day (BID) | ORAL | 1 refills | Status: DC | PRN

## 2020-08-22 NOTE — Progress Notes (Signed)
[facility] [address] [place], [place] [address]  Internal MEDICINE  Office Visit Note  Patient Name: Brian Key  [id number]  [id number]  Date of Service: 08/26/2020  Chief Complaint  Patient presents with  . Follow-up    Discuss meds  . Hyperlipidemia  . COPD  . Quality Metric Gaps    Eye exam  . Diabetes    HPI Patient is here for routine follow-up -Discussed his elevated BP today--he is convinced that this is due to his Breo inhaler that he uses at night, he does not monitor his BP at home -He feels his only issue is his chronic back pain--he is taking gabapentin, baclofen as well as meloxicam for this, he does not feel that these medications help much to relieve his pain -Complains about numbness in his legs after standing for long periods of time--has been seen by neurologist: NUMBNESS / TINGLING / CRAMPING - Slightly worse.  - Patient states his symptoms have been a little worse since last appointment. Still has calf cramping with a lot of activity. - Continue Gabapentin 300 mg up to three times a day. - Increase baclofen 5 mg to three times a day as needed for cramping. - Will recheck vitamin D and b12 labs at next appointment. He is currently taking baclofen and tizanidine everyday once daily in the AM--he does not take any medication more than once per day He was not aware that both of these medications were muscle relaxants  -DM-somewhat remembers at his last visit that he was told his glucose levels were elevated, he did not alter his diet but he does say he has always eaten fairly healthy  -Mentions that he has been tried on statin therapy in the past and was unable to tolerate and not interested in trying these mediations again  Continues to drink about 2 alcoholic drinks a night, smokes marijuana and smokes about a pack of cigarettes a day He explains to me that he is not going to stop drinking or smoking   Current Medication: Outpatient  Encounter Medications as of 08/22/2020  Medication Sig  . albuterol (ACCUNEB) 1.25 MG/3ML nebulizer solution Take 3 mLs (1.25 mg total) by nebulization every 6 (six) hours as needed for wheezing.  Marland Kitchen albuterol (VENTOLIN HFA) 108 (90 Base) MCG/ACT inhaler Inhale 2 puffs into the lungs every 6 (six) hours as needed for wheezing or shortness of breath.  . baclofen (LIORESAL) 10 MG tablet   . fluticasone furoate-vilanterol (BREO ELLIPTA) 100-25 MCG/INH AEPB INHALE 1 PUFF BY MOUTH ONCE DAILY  . gabapentin (NEURONTIN) 300 MG capsule Take 300 mg by mouth 3 (three) times daily.  . meloxicam (MOBIC) 15 MG tablet Take 1 tablet (15 mg total) by mouth daily.  . tamsulosin (FLOMAX) 0.4 MG CAPS capsule Take 1 capsule (0.4 mg total) by mouth daily.  Marland Kitchen tiZANidine (ZANAFLEX) 4 MG tablet Take 1 tablet (4 mg total) by mouth 2 (two) times daily as needed for muscle spasms.  . [DISCONTINUED] Baclofen 5 MG TABS Take 5 tablets by mouth in the morning, at noon, and at bedtime.  . [DISCONTINUED] tiZANidine (ZANAFLEX) 4 MG capsule   . [DISCONTINUED] tiZANidine (ZANAFLEX) 4 MG tablet Take 1 tablet (4 mg total) by mouth 2 (two) times daily as needed for muscle spasms.   No facility-administered encounter medications on file as of 08/22/2020.    Surgical History: Past Surgical History:  Procedure Laterality Date  . ANKLE SURGERY  2009  . HEMORRHOID SURGERY  [place]  [id number]   umbilical  . LIPOMA EXCISION  02/16/2017   back/ Dr Bary Castilla  . NECK SURGERY    . WRIST SURGERY Left 2007    Medical History: Past Medical History:  Diagnosis Date  . Back problem   . COPD (chronic obstructive pulmonary disease) ([place])   . Diabetes mellitus without complication ([place])   . Hyperlipidemia     Family History: Family History  Problem Relation Age of Onset  . Prostate cancer Neg Hx   . Bladder Cancer Neg Hx   . Kidney cancer Neg Hx     Social History   Socioeconomic History  . Marital status: Married     Spouse name: Not on file  . Number of children: Not on file  . Years of education: Not on file  . Highest education level: Not on file  Occupational History  . Not on file  Tobacco Use  . Smoking status: Current Every Day Smoker    Packs/day: 1.00    Years: 50.00    Pack years: 50.00    Types: Cigarettes  . Smokeless tobacco: Never Used  Substance and Sexual Activity  . Alcohol use: Yes    Comment: occasionally  . Drug use: Yes    Types: Marijuana  . Sexual activity: Yes    Birth control/protection: None  Other Topics Concern  . Not on file  Social History Narrative  . Not on file   Social Determinants of Health   Financial Resource Strain: Not on file  Food Insecurity: Not on file  Transportation Needs: Not on file  Physical Activity: Not on file  Stress: Not on file  Social Connections: Not on file  Intimate Partner Violence: Not on file   Review of Systems  Constitutional: Negative for chills, fatigue and unexpected weight change.  HENT: Negative for congestion, rhinorrhea, sneezing and sore throat.   Eyes: Negative for redness.  Respiratory: Negative for cough, chest tightness and shortness of breath.   Cardiovascular: Negative for chest pain and palpitations.  Gastrointestinal: Negative for abdominal pain, constipation, diarrhea, nausea and vomiting.  Genitourinary: Negative for dysuria and frequency.  Musculoskeletal: Positive for arthralgias and back pain. Negative for joint swelling and neck pain.       Sciatica pain bilateral legs  Skin: Negative for rash.  Neurological: Negative for tremors and numbness.  Hematological: Negative for adenopathy. Does not bruise/bleed easily.  Psychiatric/Behavioral: Negative for behavioral problems (Depression), sleep disturbance and suicidal ideas. The patient is not nervous/anxious.     Vital Signs: BP (!) 162/52   Pulse 67   Temp (!) 97.3 F (36.3 C)   Resp 16   Ht 5\' 9"  (1.753 m)   Wt 164 lb (74.4 kg)   SpO2 97%    BMI 24.22 kg/m    Physical Exam Vitals reviewed.  Constitutional:      Appearance: Normal appearance. He is normal weight.  Cardiovascular:     Rate and Rhythm: Normal rate and regular rhythm.     Pulses: Normal pulses.     Heart sounds: Normal heart sounds.  Pulmonary:     Effort: Pulmonary effort is normal.     Breath sounds: Normal breath sounds.  Musculoskeletal:        General: Normal range of motion.  Skin:    General: Skin is warm.  Neurological:     General: No focal deficit present.     Mental Status: He is alert and oriented to person,  place, and time. Mental status is at baseline.  Psychiatric:        Mood and Affect: Mood normal.        Behavior: Behavior normal.        Thought Content: Thought content normal.        Judgment: Judgment normal.    Assessment/Plan: 1. Impaired fasting glucose Discussed ADA guidelines regarding diagnosis of diabetes--non-cooperative in discussing elevated glucose levels and past 2 elevated A1C levels - POCT HgB A1C  2. Uncontrolled type 2 diabetes mellitus with hyperglycemia ([place]) Unwilling to believe he has diabetes, he mentions to me that he does not eat any sweets or foods with sugar, attempted to explain diabetes is not only caused by sugar intake A1C 5.7 today--slight improvement since last check, he is convinced that due to improvement in A1C he does not have diabetes Unable to start discussion of medication--will attempt at next visit Willing to be referred to ophthalmology as he needs cataract surgery - Ambulatory referral to Ophthalmology  3. Chronic midline low back pain with bilateral sciatica Requesting refills of tizanidine, advised to only take either tizanidine or baclofen, may take more than once per day Needs to inform neurology of tizanidine and baclofen use - tiZANidine (ZANAFLEX) 4 MG tablet; Take 1 tablet (4 mg total) by mouth 2 (two) times daily as needed for muscle spasms.  Dispense: 60 tablet; Refill:  1  4. Mixed hyperlipidemia Refuses to retry statin therapy Discussed vascular studies--he does not see the importance  5. Essential hypertension Explained that his BP is elevated today, convinced this is due to [facility] inhaler but has not used inhaler since last night Explained the importance of BP control, again he is not convinced this is an issue and continues to believe it is due to [employer] to switch Breo to a different inhaler, explains to me it would have the same effect no matter which inhaler he used  6. Nicotine dependence with current use Unwilling to discuss smoking cessation, discussed risks associated of long term smoking  7. 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 I have discussed any further diagnostic evaluation that may be needed or ordered today. We also reviewed his medications today. he has been encouraged to call the office with any questions or concerns that should arise related to todays visit.    Orders Placed This Encounter  Procedures  . Ambulatory referral to Ophthalmology  . POCT HgB A1C    Meds ordered this encounter  Medications  . tiZANidine (ZANAFLEX) 4 MG tablet    Sig: Take 1 tablet (4 mg total) by mouth 2 (two) times daily as needed for muscle spasms.    Dispense:  60 tablet    Refill:  1    Time spent: 45 Minutes Time spent includes review of chart, medications, test results and follow-up plan with the patient.  This patient was seen by Theodoro Grist AGNP-C in Collaboration with Dr Lavera Guise as a part of collaborative care agreement     Tanna Furry. Antione Obar AGNP-C Internal medicine

## 2020-08-26 ENCOUNTER — Encounter: Payer: Self-pay | Admitting: Hospice and Palliative Medicine

## 2020-11-08 ENCOUNTER — Other Ambulatory Visit: Payer: Self-pay | Admitting: Hospice and Palliative Medicine

## 2020-11-08 ENCOUNTER — Other Ambulatory Visit: Payer: Self-pay

## 2020-11-08 DIAGNOSIS — M461 Sacroiliitis, not elsewhere classified: Secondary | ICD-10-CM

## 2020-11-08 MED ORDER — EPINEPHRINE 0.3 MG/0.3ML IJ SOAJ: 0.3000 mg | INTRAMUSCULAR | 0 refills | Status: DC | PRN

## 2020-11-08 NOTE — Telephone Encounter (Signed)
Pt called that he bad reaction with bee sting and always keep in hand epipen as per taylor send pres

## 2020-11-09 NOTE — Progress Notes (Signed)
Multiple abnormal labs. Will need to be addressed. Iron studies, any reasons for hypoxia?

## 2020-11-21 ENCOUNTER — Other Ambulatory Visit: Payer: Self-pay

## 2020-11-21 ENCOUNTER — Encounter: Payer: Self-pay | Admitting: Hospice and Palliative Medicine

## 2020-11-21 ENCOUNTER — Ambulatory Visit (INDEPENDENT_AMBULATORY_CARE_PROVIDER_SITE_OTHER): Payer: Medicare HMO | Admitting: Hospice and Palliative Medicine

## 2020-11-21 VITALS — BP 140/84 | HR 52 | Temp 97.4°F | Resp 16 | Ht 69.0 in | Wt 160.4 lb

## 2020-11-21 DIAGNOSIS — J449 Chronic obstructive pulmonary disease, unspecified: Secondary | ICD-10-CM

## 2020-11-21 DIAGNOSIS — I10 Essential (primary) hypertension: Secondary | ICD-10-CM

## 2020-11-21 DIAGNOSIS — R001 Bradycardia, unspecified: Secondary | ICD-10-CM | POA: Diagnosis not present

## 2020-11-21 DIAGNOSIS — E1165 Type 2 diabetes mellitus with hyperglycemia: Secondary | ICD-10-CM | POA: Diagnosis not present

## 2020-11-21 DIAGNOSIS — R7303 Prediabetes: Secondary | ICD-10-CM

## 2020-11-21 DIAGNOSIS — R69 Illness, unspecified: Secondary | ICD-10-CM | POA: Diagnosis not present

## 2020-11-21 DIAGNOSIS — M5441 Lumbago with sciatica, right side: Secondary | ICD-10-CM

## 2020-11-21 DIAGNOSIS — M5442 Lumbago with sciatica, left side: Secondary | ICD-10-CM | POA: Diagnosis not present

## 2020-11-21 DIAGNOSIS — G8929 Other chronic pain: Secondary | ICD-10-CM

## 2020-11-21 DIAGNOSIS — F17200 Nicotine dependence, unspecified, uncomplicated: Secondary | ICD-10-CM

## 2020-11-21 LAB — POCT GLYCOSYLATED HEMOGLOBIN (HGB A1C): Hemoglobin A1C: 5.7 % — AB (ref 4.0–5.6)

## 2020-11-21 MED ORDER — ETODOLAC 400 MG PO TABS: 400.0000 mg | ORAL_TABLET | Freq: Two times a day (BID) | ORAL | 1 refills | Status: DC

## 2020-11-21 NOTE — Progress Notes (Signed)
[facility] [place], [place] [address]  Internal MEDICINE  Office Visit Note  Patient Name: Brian Key  [id number]  [id number]  Date of Service: 11/22/2020  Chief Complaint  Patient presents with  . Follow-up  . Diabetes  . Hyperlipidemia  . COPD  . Quality Metric Gaps    Eye exam     HPI Patient is here for routine follow-up Requests that I retake his blood pressure as he does not agree with initial reading Noted his bradycardia-he also does not believe this as he frequently monitors HR and SpO2 at home and his HR has been averaging 65-70 Denies lightheadedness or dizziness  Questions why we are taking his A1C levels as he reports that he does not eat any sweets  He continues to smoke about a pack of cigarettes per day, he refuses to discuss cessation, low dose CT chest offered, he declines  Explains to me he suffers quite a bit from PTSD from [place] war--he worked as a Runner, 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 etodolac (LODINE) 400 MG tablet Take 1 tablet (400 mg total) by mouth 2 (two) times daily.  Marland Kitchen albuterol (ACCUNEB) 1.25 MG/3ML nebulizer solution Take 3 mLs (1.25 mg total) by nebulization every 6 (six) hours as needed for wheezing.  Marland Kitchen albuterol (VENTOLIN HFA) 108 (90 Base) MCG/ACT inhaler Inhale 2 puffs into the lungs every 6 (six) hours as needed for wheezing or shortness of breath.  . baclofen (LIORESAL) 10 MG tablet    . EPINEPHrine (EPIPEN 2-PAK) 0.3 mg/0.3 mL IJ SOAJ injection Inject 0.3 mg into the muscle as needed for anaphylaxis.  . fluticasone furoate-vilanterol (BREO ELLIPTA) 100-25 MCG/INH AEPB INHALE 1 PUFF BY MOUTH ONCE DAILY  . tamsulosin (FLOMAX) 0.4 MG CAPS capsule Take 1 capsule (0.4 mg total) by mouth daily.  Marland Kitchen tiZANidine (ZANAFLEX) 4 MG tablet Take 1 tablet (4 mg total) by mouth 2 (two) times daily as needed for muscle spasms.  . [DISCONTINUED] gabapentin (NEURONTIN) 300 MG capsule Take 300 mg by mouth 3 (three) times daily. (Patient not taking: Reported on 11/21/2020)  . [DISCONTINUED] meloxicam (MOBIC) 15 MG tablet TAKE 1 TABLET BY MOUTH ONCE DAILY WITH FOOD   No facility-administered encounter medications on file as of 11/21/2020.    Surgical History: Past Surgical History:  Procedure Laterality Date  . ANKLE SURGERY  2009  . [place]  . HERNIA REPAIR  0623   umbilical  . LIPOMA EXCISION  02/16/2017   back/ Dr Bary Castilla  . NECK SURGERY    . WRIST SURGERY Left 2007    Medical History: Past Medical History:  Diagnosis Date  . Back problem   . COPD (chronic obstructive pulmonary disease) ([place])   . Diabetes mellitus without complication ([place])   . Hyperlipidemia     Family History: Family History  Problem Relation Age of Onset  . Prostate cancer Neg Hx   .  Bladder Cancer Neg Hx   . Kidney cancer Neg Hx     Social History   Socioeconomic History  . Marital status: Married    Spouse name: Not on file  . Number of children: Not on file  . Years of education: Not on file  . Highest education level: Not on file  Occupational History  . Not on file  Tobacco Use  . Smoking status: Current Every Day Smoker    Packs/day: 1.00    Years: 50.00    Pack years: 50.00    Types: Cigarettes  . Smokeless tobacco: Never Used  Substance and Sexual Activity  . Alcohol use: Yes    Comment: occasionally  . Drug use: Yes    Types: Marijuana  . Sexual activity: Yes     Birth control/protection: None  Other Topics Concern  . Not on file  Social History Narrative  . Not on file   Social Determinants of Health   Financial Resource Strain: Not on file  Food Insecurity: Not on file  Transportation Needs: Not on file  Physical Activity: Not on file  Stress: Not on file  Social Connections: Not on file  Intimate Partner Violence: Not on file      Review of Systems  Constitutional: Negative for chills, fatigue and unexpected weight change.  HENT: Negative for congestion, postnasal drip, rhinorrhea, sneezing and sore throat.   Eyes: Negative for redness.  Respiratory: Negative for cough, chest tightness and shortness of breath.   Cardiovascular: Negative for chest pain and palpitations.  Gastrointestinal: Negative for abdominal pain, constipation, diarrhea, nausea and vomiting.  Genitourinary: Negative for dysuria and frequency.  Musculoskeletal: Positive for arthralgias and back pain. Negative for joint swelling and neck pain.  Skin: Negative for rash.  Neurological: Negative for tremors and numbness.  Hematological: Negative for adenopathy. Does not bruise/bleed easily.  Psychiatric/Behavioral: Negative for behavioral problems (Depression), sleep disturbance and suicidal ideas. The patient is not nervous/anxious.     Vital Signs: BP 140/84   Pulse (!) 52   Temp (!) 97.4 F (36.3 C)   Resp 16   Ht 5\' 9"  (1.753 m)   Wt 160 lb 6.4 oz (72.8 kg)   SpO2 99%   BMI 23.69 kg/m    Physical Exam Vitals reviewed.  Constitutional:      Appearance: Normal appearance. He is normal weight.  Cardiovascular:     Rate and Rhythm: Normal rate and regular rhythm.     Pulses: Normal pulses.     Heart sounds: Normal heart sounds.  Pulmonary:     Effort: Pulmonary effort is normal.     Breath sounds: Wheezing present.  Abdominal:     General: Abdomen is flat.     Palpations: Abdomen is soft.  Musculoskeletal:        General: Normal range of  motion.     Cervical back: Normal range of motion.  Skin:    General: Skin is warm.  Neurological:     General: No focal deficit present.     Mental Status: He is alert and oriented to person, place, and time. Mental status is at baseline.  Psychiatric:        Mood and Affect: Mood normal.        Behavior: Behavior normal.        Thought Content: Thought content normal.        Judgment: Judgment normal.    Assessment/Plan: 1. Prediabetes A1C 5.7, continue to monitor He explains he  already avoids sweet and desserts - POCT HgB A1C  2. Essential hypertension BP improved on recheck, continue to monitor He feels his BP is elevated each morning due to use of inhaler at night  3. Chronic midline low back pain with bilateral sciatica Start Meloxicam and try etodolac for ongoing chronic pain - etodolac (LODINE) 400 MG tablet; Take 1 tablet (400 mg total) by mouth 2 (two) times daily.  Dispense: 60 tablet; Refill: 1  4. Chronic obstructive pulmonary disease, unspecified COPD type ([place]) Declines further work-up (PFT, CXR, CT) Reports breathing has remained the same for several years  5. Nicotine dependence with current use Declines discussion about cessation  6. Bradycardia Asymptomatic, closely monitor, he advises he will monitor HR at home--does not believe his HR is 52, declines EKG or echocardiogram  General Counseling: Casimiro Needle understanding of the findings of todays visit and agrees with plan of treatment. I have discussed any further diagnostic evaluation that may be needed or ordered today. We also reviewed his medications today. he has been encouraged to call the office with any questions or concerns that should arise related to todays visit.    Orders Placed This Encounter  Procedures  . POCT HgB A1C    Meds ordered this encounter  Medications  . etodolac (LODINE) 400 MG tablet    Sig: Take 1 tablet (400 mg total) by mouth 2 (two) times daily.    Dispense:   60 tablet    Refill:  1    Time spent: 30 Minutes   This patient was seen by Theodoro Grist AGNP-C in Collaboration with Dr Lavera Guise as a part of collaborative care agreement     Tanna Furry. Dedrick Heffner AGNP-C Internal medicine

## 2020-11-22 ENCOUNTER — Encounter: Payer: Self-pay | Admitting: Hospice and Palliative Medicine

## 2020-12-24 ENCOUNTER — Other Ambulatory Visit: Payer: Self-pay | Admitting: Hospice and Palliative Medicine

## 2020-12-24 DIAGNOSIS — H524 Presbyopia: Secondary | ICD-10-CM | POA: Diagnosis not present

## 2020-12-24 DIAGNOSIS — H04123 Dry eye syndrome of bilateral lacrimal glands: Secondary | ICD-10-CM | POA: Diagnosis not present

## 2020-12-24 DIAGNOSIS — H43813 Vitreous degeneration, bilateral: Secondary | ICD-10-CM | POA: Diagnosis not present

## 2020-12-24 DIAGNOSIS — J449 Chronic obstructive pulmonary disease, unspecified: Secondary | ICD-10-CM

## 2020-12-24 DIAGNOSIS — Z961 Presence of intraocular lens: Secondary | ICD-10-CM | POA: Diagnosis not present

## 2021-01-15 ENCOUNTER — Encounter: Payer: Self-pay | Admitting: Internal Medicine

## 2021-01-15 ENCOUNTER — Telehealth: Payer: Self-pay

## 2021-01-15 ENCOUNTER — Ambulatory Visit (INDEPENDENT_AMBULATORY_CARE_PROVIDER_SITE_OTHER): Payer: Medicare HMO | Admitting: Internal Medicine

## 2021-01-15 VITALS — Temp 97.2°F | Resp 16 | Ht 69.0 in | Wt 152.0 lb

## 2021-01-15 DIAGNOSIS — B349 Viral infection, unspecified: Secondary | ICD-10-CM

## 2021-01-15 DIAGNOSIS — Z20822 Contact with and (suspected) exposure to covid-19: Secondary | ICD-10-CM | POA: Diagnosis not present

## 2021-01-15 MED ORDER — PREDNISONE 10 MG PO TABS: ORAL_TABLET | ORAL | 0 refills | Status: DC

## 2021-01-15 MED ORDER — AZITHROMYCIN 250 MG PO TABS: ORAL_TABLET | ORAL | 0 refills | Status: DC

## 2021-01-15 NOTE — Telephone Encounter (Signed)
LMOM letting pt know that since we were cut off on the video call, DFK wanted to let pt know we sent in a zpak and prednisone to his pharmacy.

## 2021-01-15 NOTE — Progress Notes (Signed)
[facility] [place], [place] [address]  Internal MEDICINE  Telephone Visit  Patient Name: Brian Key  [id number]  [id number]  Date of Service: 01/16/2021  I connected with the patient at 1120 by telephone and verified the patients identity using two identifiers.   I discussed the limitations, risks, security and privacy concerns of performing an evaluation and management service by telephone and the availability of in person appointments. I also discussed with the patient that there may be a patient responsible charge related to the service.  The patient expressed understanding and agrees to proceed.    Chief Complaint  Patient presents with  . Acute Visit    Sweating, body aches, no heavy coughing but pt is using the albuterol inhaler, no diarrhea, no vomiting, some nausea, chills   . Telephone Screen    Video call on lap top [email or web address]   . Telephone Assessment    [phone]    HPI  Brian Key is connected for acute and sick visit initially through video however due to poor connection it was switched to an audio visit. Was complaining of body aches, sweating nausea vomiting with chills and also diarrhea. Patient has history of COPD and is concerned about COVID exposure   Current Medication: Outpatient Encounter Medications as of 01/15/2021  Medication Sig  . azithromycin (ZITHROMAX) 250 MG tablet Take one tab a day for 10 days for uri  . predniSONE (DELTASONE) 10 MG tablet Take one tab 3 x day for 3 days, then take one tab 2 x a day for 3 days and then take one tab a day for 3 days for copd  . albuterol (ACCUNEB) 1.25 MG/3ML nebulizer solution Take 3 mLs (1.25 mg total) by nebulization every 6 (six) hours as needed for wheezing.  Marland Kitchen albuterol (VENTOLIN HFA) 108 (90 Base) MCG/ACT inhaler Inhale 2 puffs into the lungs every 6 (six) hours as needed for wheezing or shortness of breath.  . baclofen (LIORESAL) 10 MG tablet   . BREO ELLIPTA  100-25 MCG/INH AEPB INHALE 1 PUFF BY MOUTH ONCE DAILY  . EPINEPHrine (EPIPEN 2-PAK) 0.3 mg/0.3 mL IJ SOAJ injection Inject 0.3 mg into the muscle as needed for anaphylaxis.  Marland Kitchen etodolac (LODINE) 400 MG tablet Take 1 tablet (400 mg total) by mouth 2 (two) times daily.  . tamsulosin (FLOMAX) 0.4 MG CAPS capsule Take 1 capsule (0.4 mg total) by mouth daily.  Marland Kitchen tiZANidine (ZANAFLEX) 4 MG tablet Take 1 tablet (4 mg total) by mouth 2 (two) times daily as needed for muscle spasms.   No facility-administered encounter medications on file as of 01/15/2021.    Surgical History: Past Surgical History:  Procedure Laterality Date  . ANKLE SURGERY  2009  . [place]  . HERNIA REPAIR  0321   umbilical  . LIPOMA EXCISION  02/16/2017   back/ Dr Bary Castilla  . NECK SURGERY    . WRIST SURGERY Left 2007    Medical History: Past Medical History:  Diagnosis Date  . Back problem   . COPD (chronic obstructive pulmonary disease) ([place])   . Diabetes mellitus without complication ([place])   . Hyperlipidemia     Family History: Family History  Problem Relation Age of Onset  . Prostate cancer Neg Hx   . Bladder Cancer Neg Hx   . Kidney cancer Neg Hx     Social History   Socioeconomic History  . Marital status: Married    Spouse name: Not on file  .  Number of children: Not on file  . Years of education: Not on file  . Highest education level: Not on file  Occupational History  . Not on file  Tobacco Use  . Smoking status: Current Every Day Smoker    Packs/day: 1.00    Years: 50.00    Pack years: 50.00    Types: Cigarettes  . Smokeless tobacco: Never Used  Substance and Sexual Activity  . Alcohol use: Yes    Comment: occasionally  . Drug use: Yes    Types: Marijuana  . Sexual activity: Yes    Birth control/protection: None  Other Topics Concern  . Not on file  Social History Narrative  . Not on file   Social Determinants of Health   Financial Resource Strain: Not on file   Food Insecurity: Not on file  Transportation Needs: Not on file  Physical Activity: Not on file  Stress: Not on file  Social Connections: Not on file  Intimate Partner Violence: Not on file      Review of Systems  Constitutional: Negative for fatigue and fever.  HENT: Negative for congestion, mouth sores and postnasal drip.   Respiratory: Negative for cough.   Cardiovascular: Negative for chest pain.  Gastrointestinal: Positive for diarrhea and nausea.  Genitourinary: Negative for flank pain.  Psychiatric/Behavioral: Negative.     Vital Signs: Temp (!) 97.2 F (36.2 C)   Resp 16   Ht 5\' 9"  (1.753 m)   Wt 152 lb (68.9 kg)   BMI 22.45 kg/m    Observation/Objective: Patient is able to communicate well he is not in any shortness of breath or no distress    Assessment/Plan: 1. Close exposure to COVID-19 virus Due to increased risk of COVID exposure we will go ahead and start him on steroids and azithromycin.  In any worsening condition patient needs to report to the emergency room or call the office for further instructions  2. Viral disease Seems to be viral prodrome.  He is instructed to drink plenty of fluids and activity as tolerated take today and tomorrow ( limited qd) call the office for further instructions  General Counseling: jacinto keil understanding of the findings of today's phone visit and agrees with plan of treatment. I have discussed any further diagnostic evaluation that may be needed or ordered today. We also reviewed his medications today. he has been encouraged to call the office with any questions or concerns that should arise related to todays visit.      Meds ordered this encounter  Medications  . azithromycin (ZITHROMAX) 250 MG tablet    Sig: Take one tab a day for 10 days for uri    Dispense:  10 tablet    Refill:  0  . predniSONE (DELTASONE) 10 MG tablet    Sig: Take one tab 3 x day for 3 days, then take one tab 2 x a day for 3  days and then take one tab a day for 3 days for copd    Dispense:  18 tablet    Refill:  0    Time spent:10 Minutes    Dr Lavera Guise Internal medicine

## 2021-01-16 ENCOUNTER — Telehealth: Payer: Self-pay

## 2021-01-23 ENCOUNTER — Other Ambulatory Visit: Payer: Self-pay

## 2021-01-23 DIAGNOSIS — G8929 Other chronic pain: Secondary | ICD-10-CM

## 2021-01-23 MED ORDER — TIZANIDINE HCL 4 MG PO TABS: 4.0000 mg | ORAL_TABLET | Freq: Two times a day (BID) | ORAL | 1 refills | Status: DC | PRN

## 2021-01-30 ENCOUNTER — Telehealth: Payer: Self-pay

## 2021-01-30 NOTE — Telephone Encounter (Signed)
Lvm to arrive @ 8:40 for 02/03/21 appointment-Toni

## 2021-02-03 ENCOUNTER — Encounter: Payer: Self-pay | Admitting: Physician Assistant

## 2021-02-03 ENCOUNTER — Ambulatory Visit (INDEPENDENT_AMBULATORY_CARE_PROVIDER_SITE_OTHER): Payer: Medicare HMO | Admitting: Physician Assistant

## 2021-02-03 ENCOUNTER — Other Ambulatory Visit: Payer: Self-pay

## 2021-02-03 DIAGNOSIS — F17200 Nicotine dependence, unspecified, uncomplicated: Secondary | ICD-10-CM | POA: Diagnosis not present

## 2021-02-03 DIAGNOSIS — R300 Dysuria: Secondary | ICD-10-CM | POA: Diagnosis not present

## 2021-02-03 DIAGNOSIS — G8929 Other chronic pain: Secondary | ICD-10-CM

## 2021-02-03 DIAGNOSIS — Z0001 Encounter for general adult medical examination with abnormal findings: Secondary | ICD-10-CM | POA: Diagnosis not present

## 2021-02-03 DIAGNOSIS — R5383 Other fatigue: Secondary | ICD-10-CM | POA: Diagnosis not present

## 2021-02-03 DIAGNOSIS — M5442 Lumbago with sciatica, left side: Secondary | ICD-10-CM | POA: Diagnosis not present

## 2021-02-03 DIAGNOSIS — I10 Essential (primary) hypertension: Secondary | ICD-10-CM | POA: Diagnosis not present

## 2021-02-03 DIAGNOSIS — E782 Mixed hyperlipidemia: Secondary | ICD-10-CM

## 2021-02-03 DIAGNOSIS — J449 Chronic obstructive pulmonary disease, unspecified: Secondary | ICD-10-CM | POA: Diagnosis not present

## 2021-02-03 DIAGNOSIS — R69 Illness, unspecified: Secondary | ICD-10-CM | POA: Diagnosis not present

## 2021-02-03 DIAGNOSIS — M5441 Lumbago with sciatica, right side: Secondary | ICD-10-CM

## 2021-02-03 NOTE — Progress Notes (Signed)
[facility] [place], [place] [address]  Internal MEDICINE  Office Visit Note  Patient Name: Brian Key  [id number]  [id number]  Date of Service: 02/04/2021  Chief Complaint  Patient presents with   Medicare Wellness   COPD   Diabetes   Hyperlipidemia     HPI Pt is here for routine health maintenance examination -Had a recent eye exam and states it went well given hx of cataracts and partial color blindness. -BP and HR at home stable. Bp at home usually 106-269 systolic, HR 48N. -COPD stable, using breo, albuterol occasionally depending on the pollen count -Smoking <1ppd, does smoke marijuana at times--states it helps sleep and pain and that he is not interested in quitting either. Is willing to have lose dose CT for cancer screening -Still working with [employer] and works 7 days a week -Recovered from covid-like illness after about 8 days of symptoms -Due for routine labs  Current Medication: Outpatient Encounter Medications as of 02/03/2021  Medication Sig   albuterol (ACCUNEB) 1.25 MG/3ML nebulizer solution Take 3 mLs (1.25 mg total) by nebulization every 6 (six) hours as needed for wheezing.   albuterol (VENTOLIN HFA) 108 (90 Base) MCG/ACT inhaler Inhale 2 puffs into the lungs every 6 (six) hours as needed for wheezing or shortness of breath.   baclofen (LIORESAL) 10 MG tablet    BREO ELLIPTA 100-25 MCG/INH AEPB INHALE 1 PUFF BY MOUTH ONCE DAILY   EPINEPHrine (EPIPEN 2-PAK) 0.3 mg/0.3 mL IJ SOAJ injection Inject 0.3 mg into the muscle as needed for anaphylaxis.   etodolac (LODINE) 400 MG tablet Take 1 tablet (400 mg total) by mouth 2 (two) times daily.   predniSONE (DELTASONE) 10 MG tablet Take one tab 3 x day for 3 days, then take one tab 2 x a day for 3 days and then take one tab a day for 3 days for copd   tamsulosin (FLOMAX) 0.4 MG CAPS capsule Take 1 capsule (0.4 mg total) by mouth daily.   tiZANidine (ZANAFLEX) 4 MG tablet Take 1  tablet (4 mg total) by mouth 2 (two) times daily as needed for muscle spasms.   [DISCONTINUED] azithromycin (ZITHROMAX) 250 MG tablet Take one tab a day for 10 days for uri   No facility-administered encounter medications on file as of 02/03/2021.    Surgical History: Past Surgical History:  Procedure Laterality Date   ANKLE SURGERY  2009   [place]   HERNIA REPAIR  4627   umbilical   LIPOMA EXCISION  02/16/2017   back/ Dr Bary Castilla   NECK SURGERY     WRIST SURGERY Left 2007    Medical History: Past Medical History:  Diagnosis Date   Back problem    COPD (chronic obstructive pulmonary disease) ([place])    Diabetes mellitus without complication ([place])    Hyperlipidemia     Family History: Family History  Problem Relation Age of Onset   Prostate cancer Neg Hx    Bladder Cancer Neg Hx    Kidney cancer Neg Hx       Review of Systems  Constitutional:  Negative for chills, fatigue and unexpected weight change.  HENT:  Negative for congestion, postnasal drip, rhinorrhea, sneezing and sore throat.   Eyes:  Negative for redness.  Respiratory:  Positive for wheezing. Negative for cough, chest tightness and shortness of breath.   Cardiovascular:  Negative for chest pain and palpitations.  Gastrointestinal:  Negative for abdominal pain, constipation, diarrhea,  nausea and vomiting.  Genitourinary:  Negative for dysuria and frequency.  Musculoskeletal:  Positive for arthralgias and back pain. Negative for joint swelling and neck pain.  Skin:  Negative for rash.  Allergic/Immunologic: Positive for environmental allergies.  Neurological: Negative.  Negative for tremors and numbness.  Hematological:  Negative for adenopathy. Does not bruise/bleed easily.  Psychiatric/Behavioral:  Negative for behavioral problems (Depression), sleep disturbance and suicidal ideas. The patient is not nervous/anxious.     Vital Signs: BP 138/74   Pulse 64   Temp 97.8 F (36.6 C)   Resp  16   Ht 5\' 9"  (1.753 m)   Wt 151 lb (68.5 kg)   SpO2 94%   BMI 22.30 kg/m    Physical Exam Vitals and nursing note reviewed.  Constitutional:      General: He is not in acute distress.    Appearance: He is well-developed and normal weight. He is not diaphoretic.  HENT:     Head: Normocephalic and atraumatic.     Right Ear: External ear normal.     Left Ear: External ear normal.     Nose: Nose normal.     Mouth/Throat:     Pharynx: No oropharyngeal exudate.  Eyes:     General: No scleral icterus.       Right eye: No discharge.        Left eye: No discharge.     Conjunctiva/sclera: Conjunctivae normal.     Pupils: Pupils are equal, round, and reactive to light.  Neck:     Thyroid: No thyromegaly.     Vascular: No JVD.     Trachea: No tracheal deviation.  Cardiovascular:     Rate and Rhythm: Normal rate and regular rhythm.     Heart sounds: Normal heart sounds. No murmur heard.   No friction rub. No gallop.  Pulmonary:     Effort: Pulmonary effort is normal. No respiratory distress.     Breath sounds: No stridor. Wheezing present. No rales.  Chest:     Chest wall: No tenderness.  Abdominal:     General: Bowel sounds are normal. There is no distension.     Palpations: Abdomen is soft. There is no mass.     Tenderness: There is no abdominal tenderness. There is no guarding or rebound.  Musculoskeletal:        General: No tenderness or deformity. Normal range of motion.     Cervical back: Normal range of motion and neck supple.     Right lower leg: No edema.     Left lower leg: No edema.  Lymphadenopathy:     Cervical: No cervical adenopathy.  Skin:    General: Skin is warm and dry.     Coloration: Skin is not pale.     Findings: No erythema or rash.  Neurological:     General: No focal deficit present.     Mental Status: He is alert.     Cranial Nerves: No cranial nerve deficit.     Motor: No abnormal muscle tone.     Coordination: Coordination normal.     Deep  Tendon Reflexes: Reflexes are normal and symmetric.  Psychiatric:        Behavior: Behavior normal.        Thought Content: Thought content normal.        Judgment: Judgment normal.     LABS: Recent Results (from the past 2160 hour(s))  POCT HgB A1C     Status: Abnormal  Collection Time: 11/21/20  9:54 AM  Result Value Ref Range   Hemoglobin A1C 5.7 (A) 4.0 - 5.6 %   HbA1c POC (<> result, manual entry)     HbA1c, POC (prediabetic range)     HbA1c, POC (controlled diabetic range)    UA/M w/rflx Culture, Routine     Status: Abnormal   Collection Time: 02/03/21  9:40 AM   Specimen: Urine   Urine  Result Value Ref Range   Specific Gravity, UA 1.029 1.005 - 1.030   pH, UA 7.0 5.0 - 7.5   Color, UA Yellow Yellow   Appearance Ur Clear Clear   Leukocytes,UA Negative Negative   Protein,UA Trace Negative/Trace   Glucose, UA Negative Negative   Ketones, UA Trace (A) Negative   RBC, UA Negative Negative   Bilirubin, UA Negative Negative   Urobilinogen, Ur 1.0 0.2 - 1.0 mg/dL   Nitrite, UA Negative Negative   Microscopic Examination Comment     Comment: Microscopic follows if indicated.   Microscopic Examination See below:     Comment: Microscopic was indicated and was performed.   Urinalysis Reflex Comment     Comment: This specimen will not reflex to a Urine Culture.  Microscopic Examination     Status: None   Collection Time: 02/03/21  9:40 AM   Urine  Result Value Ref Range   WBC, UA None seen 0 - 5 /hpf   RBC 0-2 0 - 2 /hpf   Epithelial Cells (non renal) None seen 0 - 10 /hpf   Casts None seen None seen /lpf   Bacteria, UA None seen None seen/Few       Assessment/Plan: 1. Encounter for general adult medical examination with abnormal findings Will update routine lab work  2. Essential hypertension Stable, continue current therapy  3. Chronic obstructive pulmonary disease, unspecified COPD type ([place]) Stable, continue inhalers as prescribed.  Patient will go for  low-dose CT scan lung cancer screening - CT CHEST LUNG CA SCREEN LOW DOSE W/O CM; Future  4. 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 Mixed hyperlipidemia Will update labs - Lipid Panel With LDL/HDL Ratio  7. Other fatigue - CBC w/Diff/Platelet - Comprehensive metabolic panel - TSH + free T4  8. Dysuria - UA/M w/rflx Culture, Routine  General Counseling: Booker verbalizes understanding of the findings of todays visit and agrees with plan of treatment. I have discussed any further diagnostic evaluation that may be needed or ordered today. We also reviewed his medications today. he has been encouraged to call the office with any questions or concerns that should arise related to todays visit.    Counseling:     Orders Placed This Encounter  Procedures   Microscopic Examination   CT CHEST LUNG CA SCREEN LOW DOSE W/O CM   CBC w/Diff/Platelet   Comprehensive metabolic panel   Lipid Panel With LDL/HDL Ratio   TSH + free T4   UA/M w/rflx Culture, Routine     No orders of the defined types were placed in this encounter.   This patient was seen by Drema Dallas, PA-C in collaboration with Dr. Clayborn Bigness as a part of collaborative care agreement.  Total time spent:30 Minutes  Time spent includes review of chart, medications, test results,  and follow up plan with the patient.     Lavera Guise, MD  Internal Medicine

## 2021-02-04 LAB — UA/M W/RFLX CULTURE, ROUTINE
Bilirubin, UA: NEGATIVE
Glucose, UA: NEGATIVE
Leukocytes,UA: NEGATIVE
Nitrite, UA: NEGATIVE
RBC, UA: NEGATIVE
Specific Gravity, UA: 1.029 (ref 1.005–1.030)
Urobilinogen, Ur: 1 mg/dL (ref 0.2–1.0)
pH, UA: 7 (ref 5.0–7.5)

## 2021-02-04 LAB — MICROSCOPIC EXAMINATION
Bacteria, UA: NONE SEEN
Casts: NONE SEEN /lpf
Epithelial Cells (non renal): NONE SEEN /hpf (ref 0–10)
WBC, UA: NONE SEEN /hpf (ref 0–5)

## 2021-02-07 ENCOUNTER — Other Ambulatory Visit: Payer: Self-pay

## 2021-02-07 MED ORDER — EPINEPHRINE 0.3 MG/0.3ML IJ SOAJ: 0.3000 mg | INTRAMUSCULAR | 0 refills | Status: DC | PRN

## 2021-02-07 NOTE — Telephone Encounter (Signed)
Pt called need refill for Epipen he misplaced his epipen as per lauren send another one

## 2021-02-12 DIAGNOSIS — R5383 Other fatigue: Secondary | ICD-10-CM | POA: Diagnosis not present

## 2021-02-12 DIAGNOSIS — E782 Mixed hyperlipidemia: Secondary | ICD-10-CM | POA: Diagnosis not present

## 2021-02-13 LAB — COMPREHENSIVE METABOLIC PANEL
ALT: 10 IU/L (ref 0–44)
AST: 12 IU/L (ref 0–40)
Albumin/Globulin Ratio: 1.9 (ref 1.2–2.2)
Albumin: 3.9 g/dL (ref 3.7–4.7)
Alkaline Phosphatase: 108 IU/L (ref 44–121)
BUN/Creatinine Ratio: 13 (ref 10–24)
BUN: 12 mg/dL (ref 8–27)
Bilirubin Total: 0.4 mg/dL (ref 0.0–1.2)
CO2: 23 mmol/L (ref 20–29)
Calcium: 9.1 mg/dL (ref 8.6–10.2)
Chloride: 105 mmol/L (ref 96–106)
Creatinine, Ser: 0.91 mg/dL (ref 0.76–1.27)
Globulin, Total: 2.1 g/dL (ref 1.5–4.5)
Glucose: 121 mg/dL — ABNORMAL HIGH (ref 65–99)
Potassium: 5 mmol/L (ref 3.5–5.2)
Sodium: 143 mmol/L (ref 134–144)
Total Protein: 6 g/dL (ref 6.0–8.5)
eGFR: 89 mL/min/{1.73_m2} (ref >59)

## 2021-02-13 LAB — CBC WITH DIFFERENTIAL/PLATELET
Basophils Absolute: 0.1 10*3/uL (ref 0.0–0.2)
Basos: 1 %
EOS (ABSOLUTE): 0.2 10*3/uL (ref 0.0–0.4)
Eos: 3 %
Hematocrit: 47 % (ref 37.5–51.0)
Hemoglobin: 16.2 g/dL (ref 13.0–17.7)
Immature Grans (Abs): 0 10*3/uL (ref 0.0–0.1)
Immature Granulocytes: 0 %
Lymphocytes Absolute: 2.4 10*3/uL (ref 0.7–3.1)
Lymphs: 34 %
MCH: 31.6 pg (ref 26.6–33.0)
MCHC: 34.5 g/dL (ref 31.5–35.7)
MCV: 92 fL (ref 79–97)
Monocytes Absolute: 0.5 10*3/uL (ref 0.1–0.9)
Monocytes: 7 %
Neutrophils Absolute: 4 10*3/uL (ref 1.4–7.0)
Neutrophils: 55 %
Platelets: 143 10*3/uL — ABNORMAL LOW (ref 150–450)
RBC: 5.12 x10E6/uL (ref 4.14–5.80)
RDW: 13.1 % (ref 11.6–15.4)
WBC: 7.3 10*3/uL (ref 3.4–10.8)

## 2021-02-13 LAB — LIPID PANEL WITH LDL/HDL RATIO
Cholesterol, Total: 211 mg/dL — ABNORMAL HIGH (ref 100–199)
HDL: 43 mg/dL (ref >39)
LDL Chol Calc (NIH): 152 mg/dL — ABNORMAL HIGH (ref 0–99)
LDL/HDL Ratio: 3.5 ratio (ref 0.0–3.6)
Triglycerides: 88 mg/dL (ref 0–149)
VLDL Cholesterol Cal: 16 mg/dL (ref 5–40)

## 2021-02-13 LAB — TSH+FREE T4
Free T4: 1.19 ng/dL (ref 0.82–1.77)
TSH: 1.69 u[IU]/mL (ref 0.450–4.500)

## 2021-02-19 ENCOUNTER — Ambulatory Visit: Payer: Medicare HMO | Admitting: Urology

## 2021-02-19 ENCOUNTER — Encounter: Payer: Self-pay | Admitting: Urology

## 2021-02-19 ENCOUNTER — Other Ambulatory Visit: Payer: Self-pay

## 2021-02-19 VITALS — BP 177/72 | HR 64 | Ht 69.0 in | Wt 153.9 lb

## 2021-02-19 DIAGNOSIS — N400 Enlarged prostate without lower urinary tract symptoms: Secondary | ICD-10-CM

## 2021-02-19 LAB — BLADDER SCAN AMB NON-IMAGING

## 2021-02-19 MED ORDER — TAMSULOSIN HCL 0.4 MG PO CAPS: 0.4000 mg | ORAL_CAPSULE | Freq: Every day | ORAL | 3 refills | Status: DC

## 2021-02-19 NOTE — Progress Notes (Signed)
   02/19/2021 10:56 AM   [name] October 06, 1945 [id number]  Reason for visit: Follow up BPH   HPI: I saw Mr. Fessel in urology clinic today in follow-up for urinary symptoms.  He was previously followed by Dr. Pilar Jarvis.  He is a 75 year old male whose been followed long-term for BPH and intermittent urinary symptoms.  He has previously had some burning with urination after a bout of prostatitis, however follow-up cystoscopy with Dr. Pilar Jarvis was normal.  He has been on Flomax 0.4 mg nightly long-term which he feels significantly improved his symptoms.  He really denies any complaints about his urination today and is overall doing very well.  He denies any UTIs, retention, or gross hematuria since our last visit.     Last PSA in June 2020 was normal and stable at 1.1, and PSA screening was discontinued per the AUA guidelines using shared decision making.  Prostate measures 58 g on CT in 2016.   He would like to continue Flomax for his stable BPH.   Continue Flomax for BPH symptoms  Billey Co, MD  [facility] [address], [place], [place] [address] [phone]

## 2021-03-24 ENCOUNTER — Other Ambulatory Visit: Payer: Self-pay | Admitting: Internal Medicine

## 2021-03-24 DIAGNOSIS — G8929 Other chronic pain: Secondary | ICD-10-CM

## 2021-03-25 ENCOUNTER — Encounter: Payer: Self-pay | Admitting: Nurse Practitioner

## 2021-03-25 ENCOUNTER — Ambulatory Visit (INDEPENDENT_AMBULATORY_CARE_PROVIDER_SITE_OTHER): Payer: Medicare HMO | Admitting: Nurse Practitioner

## 2021-03-25 ENCOUNTER — Other Ambulatory Visit: Payer: Self-pay

## 2021-03-25 VITALS — BP 138/68 | HR 80 | Temp 97.1°F | Resp 16 | Ht 69.0 in | Wt 151.2 lb

## 2021-03-25 DIAGNOSIS — M25552 Pain in left hip: Secondary | ICD-10-CM | POA: Diagnosis not present

## 2021-03-25 DIAGNOSIS — G8929 Other chronic pain: Secondary | ICD-10-CM

## 2021-03-25 DIAGNOSIS — M5442 Lumbago with sciatica, left side: Secondary | ICD-10-CM | POA: Diagnosis not present

## 2021-03-25 DIAGNOSIS — M5441 Lumbago with sciatica, right side: Secondary | ICD-10-CM

## 2021-03-25 DIAGNOSIS — G729 Myopathy, unspecified: Secondary | ICD-10-CM | POA: Diagnosis not present

## 2021-03-25 DIAGNOSIS — I10 Essential (primary) hypertension: Secondary | ICD-10-CM | POA: Diagnosis not present

## 2021-03-25 DIAGNOSIS — R7303 Prediabetes: Secondary | ICD-10-CM

## 2021-03-25 DIAGNOSIS — F17200 Nicotine dependence, unspecified, uncomplicated: Secondary | ICD-10-CM | POA: Diagnosis not present

## 2021-03-25 DIAGNOSIS — Z23 Encounter for immunization: Secondary | ICD-10-CM | POA: Diagnosis not present

## 2021-03-25 DIAGNOSIS — R69 Illness, unspecified: Secondary | ICD-10-CM | POA: Diagnosis not present

## 2021-03-25 DIAGNOSIS — Z5329 Procedure and treatment not carried out because of patient's decision for other reasons: Secondary | ICD-10-CM

## 2021-03-25 DIAGNOSIS — E782 Mixed hyperlipidemia: Secondary | ICD-10-CM | POA: Diagnosis not present

## 2021-03-25 DIAGNOSIS — M25551 Pain in right hip: Secondary | ICD-10-CM | POA: Diagnosis not present

## 2021-03-25 LAB — POCT GLYCOSYLATED HEMOGLOBIN (HGB A1C): Hemoglobin A1C: 5.5 % (ref 4.0–5.6)

## 2021-03-25 MED ORDER — TETANUS-DIPHTH-ACELL PERTUSSIS 5-2.5-18.5 LF-MCG/0.5 IM SUSP: 0.5000 mL | Freq: Once | INTRAMUSCULAR | 0 refills | Status: AC

## 2021-03-25 NOTE — Progress Notes (Signed)
[facility] [place], [place] [address]  Internal MEDICINE  Office Visit Note  Patient Name: Brian Key  [id number]  [id number]  Date of Service: 03/25/2021  Chief Complaint  Patient presents with   Follow-up    Consider statin, wants referral to hip surgeon    Diabetes   Hyperlipidemia   COPD   Quality Metric Gaps    Tdap, cologuard    HPI Brian Key presents for a follow up visit for diabetes, hyperlipidemia and COPD. 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 His other significant history includes chronic back and neck pain, a hernia repair, and various other minor surgeries. He is due for colorectal cancer screening and has declined the colonoscopy but requested the cologard stool test. He already has the kit and just needs to send off the sample.  -He has chronic neck pain, had a plate surgically placed in his neck. He has seen a neurologist who did nerve conduction testing. He has had 5 MRIs, and he has been seen by a chronic pain management specialist which was also unsuccessful. He reports that he is in more pain the more he walks. He also thinks some of this pain stems from his chronic low back pain and hips. He is requesting a referral to orthopedic surgery to have them assess his lumbar spine and his hip joints.  -His blood pressure is wnl and he is not currently taking any blood pressure medications -most recent total cholesterol level was 211 and LDL was 1 52, statin therapy is recommended but the patient declined. Discussed taking an OTC fish oil supplement 1000 mg daily which he was open to.  Has impaired fasting glucose on prior labs, A1C checked in office today. He has also had prediabetic A1C in the past. His level was normal today at 5.5.    Current Medication: Outpatient Encounter Medications as of 03/25/2021  Medication Sig   albuterol (ACCUNEB) 1.25 MG/3ML nebulizer solution Take 3 mLs (1.25 mg total) by nebulization every 6 (six)  hours as needed for wheezing.   albuterol (VENTOLIN HFA) 108 (90 Base) MCG/ACT inhaler Inhale 2 puffs into the lungs every 6 (six) hours as needed for wheezing or shortness of breath.   baclofen (LIORESAL) 10 MG tablet    BREO ELLIPTA 100-25 MCG/INH AEPB INHALE 1 PUFF BY MOUTH ONCE DAILY   EPINEPHrine (EPIPEN 2-PAK) 0.3 mg/0.3 mL IJ SOAJ injection Inject 0.3 mg into the muscle as needed for anaphylaxis.   meloxicam (MOBIC) 15 MG tablet Take 15 mg by mouth daily.   predniSONE (DELTASONE) 10 MG tablet Take one tab 3 x day for 3 days, then take one tab 2 x a day for 3 days and then take one tab a day for 3 days for copd   tamsulosin (FLOMAX) 0.4 MG CAPS capsule Take 1 capsule (0.4 mg total) by mouth daily.   tiZANidine (ZANAFLEX) 4 MG tablet TAKE 1 TABLET BY MOUTH TWICE DAILY AS NEEDED FOR MUSCLE SPASMS   [DISCONTINUED] Tdap (BOOSTRIX) 5-2.5-18.5 LF-MCG/0.5 injection Inject 0.5 mLs into the muscle once.   [EXPIRED] Tdap (BOOSTRIX) 5-2.5-18.5 LF-MCG/0.5 injection Inject 0.5 mLs into the muscle once for 1 dose.   [DISCONTINUED] EPINEPHrine (EPIPEN 2-PAK) 0.3 mg/0.3 mL IJ SOAJ injection Inject 0.3 mg into the muscle as needed for anaphylaxis.   [DISCONTINUED] etodolac (LODINE) 400 MG tablet Take 1 tablet (400 mg total) by mouth 2 (two) times daily.   [DISCONTINUED] fluticasone furoate-vilanterol (BREO ELLIPTA) 100-25 MCG/INH AEPB  INHALE 1 PUFF BY MOUTH ONCE DAILY   [DISCONTINUED] tamsulosin (FLOMAX) 0.4 MG CAPS capsule Take 1 capsule (0.4 mg total) by mouth daily.   [DISCONTINUED] tiZANidine (ZANAFLEX) 4 MG tablet Take 1 tablet (4 mg total) by mouth 2 (two) times daily as needed for muscle spasms.   No facility-administered encounter medications on file as of 03/25/2021.    Surgical History: Past Surgical History:  Procedure Laterality Date   ANKLE SURGERY  2009   [place]   HERNIA REPAIR  1245   umbilical   LIPOMA EXCISION  02/16/2017   back/ Dr Bary Castilla   NECK SURGERY     WRIST  SURGERY Left 2007    Medical History: Past Medical History:  Diagnosis Date   Back problem    COPD (chronic obstructive pulmonary disease) ([place])    Diabetes mellitus without complication ([place])    Hyperlipidemia     Family History: Family History  Problem Relation Age of Onset   Heart disease Mother    COPD Father    Asthma Father    Prostate cancer Neg Hx    Bladder Cancer Neg Hx    Kidney cancer Neg Hx     Social History   Socioeconomic History   Marital status: Married    Spouse name: Not on file   Number of children: Not on file   Years of education: Not on file   Highest education level: Not on file  Occupational History   Not on file  Tobacco Use   Smoking status: Every Day    Packs/day: 1.00    Years: 50.00    Pack years: 50.00    Types: Cigarettes   Smokeless tobacco: Never  Vaping Use   Vaping Use: Never used  Substance and Sexual Activity   Alcohol use: Yes    Comment: occasionally   Drug use: Yes    Types: Marijuana   Sexual activity: Yes    Birth control/protection: None  Other Topics Concern   Not on file  Social History Narrative   Not on file   Social Determinants of Health   Financial Resource Strain: Not on file  Food Insecurity: Not on file  Transportation Needs: Not on file  Physical Activity: Not on file  Stress: Not on file  Social Connections: Not on file  Intimate Partner Violence: Not on file      Review of Systems  Constitutional:  Negative for chills, fatigue and unexpected weight change.  HENT:  Negative for congestion, rhinorrhea, sneezing and sore throat.   Eyes:  Negative for redness.  Respiratory:  Negative for cough, chest tightness and shortness of breath.   Cardiovascular:  Negative for chest pain and palpitations.  Gastrointestinal:  Negative for abdominal pain, constipation, diarrhea, nausea and vomiting.  Genitourinary:  Negative for dysuria and frequency.  Musculoskeletal:  Positive for arthralgias, back  pain, neck pain and neck stiffness. Negative for joint swelling.  Skin:  Negative for rash.  Neurological: Negative.  Negative for tremors and numbness.  Hematological:  Negative for adenopathy. Does not bruise/bleed easily.  Psychiatric/Behavioral:  Negative for behavioral problems (Depression), sleep disturbance and suicidal ideas. The patient is not nervous/anxious.    Vital Signs: BP 138/68   Pulse 80   Temp (!) 97.1 F (36.2 C)   Resp 16   Ht 5' 9"  (1.753 m)   Wt 151 lb 3.2 oz (68.6 kg)   SpO2 98%   BMI 22.33 kg/m    Physical Exam  Vitals reviewed.  Constitutional:      General: He is not in acute distress.    Appearance: Normal appearance. He is normal weight. He is not ill-appearing.  HENT:     Head: Normocephalic and atraumatic.  Cardiovascular:     Rate and Rhythm: Normal rate and regular rhythm.     Pulses: Normal pulses.  Pulmonary:     Effort: Pulmonary effort is normal. No respiratory distress.  Skin:    General: Skin is warm and dry.     Capillary Refill: Capillary refill takes less than 2 seconds.  Neurological:     Mental Status: He is alert and oriented to person, place, and time.  Psychiatric:        Mood and Affect: Mood normal.        Behavior: Behavior normal.    Assessment/Plan: 1. Chronic pain of both hips Patient requested evaluation by orthopedic surgeon - Ambulatory referral to Orthopedic Surgery  2. Chronic midline low back pain with bilateral sciatica Referred to orthopedic surgeon  3. Nicotine dependence with current use Not interested in smoking cessation counseling, acknowledges the risks of continued tobacco use.   4. Mixed hyperlipidemia Patient will pick up OTC fish oil supplement 1000 mg daily  5. Essential hypertension Stable without medications  6. Prediabetes A1C is within normal limits, will recheck in 3 months to assess for consistency.  - POCT glycosylated hemoglobin (Hb A1C)  7. myopathy Pt is not willing to take  statin due to worsening pain Discussed statin therapy with patient which is recommended due to current cholesterol levels. Patient declined.   8. Need for tetanus booster Order sent to pharmacy. - Tdap (BOOSTRIX) 5-2.5-18.5 LF-MCG/0.5 injection; Inject 0.5 mLs into the muscle once for 1 dose.  Dispense: 0.5 mL; Refill: 0   General Counseling: Kennet verbalizes understanding of the findings of todays visit and agrees with plan of treatment. I have discussed any further diagnostic evaluation that may be needed or ordered today. We also reviewed his medications today. he has been encouraged to call the office with any questions or concerns that should arise related to todays visit.    Orders Placed This Encounter  Procedures   Ambulatory referral to Orthopedic Surgery   POCT glycosylated hemoglobin (Hb A1C)    Meds ordered this encounter  Medications   Tdap (BOOSTRIX) 5-2.5-18.5 LF-MCG/0.5 injection    Sig: Inject 0.5 mLs into the muscle once for 1 dose.    Dispense:  0.5 mL    Refill:  0    Return in about 3 months (around 06/25/2021) for F/U, Recheck A1C, med refill, Sharyah Bostwick PCP.   Total time spent:30 Minutes Time spent includes review of chart, medications, test results, and follow up plan with the patient.   [place] Controlled Substance Database was reviewed by me.  This patient was seen by Jonetta Osgood, FNP-C in collaboration with Dr. Clayborn Bigness as a part of collaborative care agreement.   Jameon Deller R. Valetta Fuller, MSN, FNP-C Internal medicine

## 2021-05-08 ENCOUNTER — Other Ambulatory Visit: Payer: Self-pay | Admitting: Internal Medicine

## 2021-05-12 ENCOUNTER — Telehealth: Payer: Self-pay

## 2021-05-12 NOTE — Telephone Encounter (Signed)
Orthopedic referral initially sent via Epic on 04/21/21. Referral did not go through successfully. Manually faxed today to [phone]

## 2021-05-27 NOTE — Telephone Encounter (Signed)
Scheduled with [facility] 02/06/2022 @ 9:00-Toni

## 2021-06-24 ENCOUNTER — Ambulatory Visit (INDEPENDENT_AMBULATORY_CARE_PROVIDER_SITE_OTHER): Payer: Medicare HMO | Admitting: Nurse Practitioner

## 2021-06-24 ENCOUNTER — Encounter: Payer: Self-pay | Admitting: Nurse Practitioner

## 2021-06-24 ENCOUNTER — Other Ambulatory Visit: Payer: Self-pay

## 2021-06-24 VITALS — BP 170/70 | HR 61 | Temp 98.6°F | Resp 16 | Ht 69.0 in | Wt 149.6 lb

## 2021-06-24 DIAGNOSIS — I10 Essential (primary) hypertension: Secondary | ICD-10-CM

## 2021-06-24 DIAGNOSIS — L03031 Cellulitis of right toe: Secondary | ICD-10-CM | POA: Diagnosis not present

## 2021-06-24 DIAGNOSIS — E1165 Type 2 diabetes mellitus with hyperglycemia: Secondary | ICD-10-CM | POA: Diagnosis not present

## 2021-06-24 DIAGNOSIS — R7303 Prediabetes: Secondary | ICD-10-CM

## 2021-06-24 LAB — POCT GLYCOSYLATED HEMOGLOBIN (HGB A1C): Hemoglobin A1C: 5.6 % — AB (ref 4.0–5.6)

## 2021-06-24 MED ORDER — DOXYCYCLINE HYCLATE 100 MG PO TABS: 100.0000 mg | ORAL_TABLET | Freq: Two times a day (BID) | ORAL | 0 refills | Status: DC

## 2021-06-24 MED ORDER — MUPIROCIN 2 % EX OINT: 1.0000 "application " | TOPICAL_OINTMENT | Freq: Two times a day (BID) | CUTANEOUS | 0 refills | Status: DC

## 2021-06-24 MED ORDER — AMLODIPINE BESYLATE 2.5 MG PO TABS: 2.5000 mg | ORAL_TABLET | Freq: Every day | ORAL | 2 refills | Status: DC

## 2021-06-24 NOTE — Progress Notes (Addendum)
[facility] [place], [place] [address]  Internal MEDICINE  Office Visit Note  Patient Name: Brian Key  [id number]  [id number]  Date of Service: 06/24/2021  Chief Complaint  Patient presents with   Follow-up    Wants to take UDS, and to start working again    Diabetes   Hyperlipidemia   COPD    HPI [name] presents for a follow up visit for medication review and hypertension. His blood pressure is elevated and he is not on any blood pressure medication. He has tried bisoprolol-HCTZ before. He reports that he will try a new med for 5 days but will stop taking it if it makes him sick or he has unwanted side effects. He denies any headaches or chest pains.  He has chronic bilateral hip pain. He was referred to orthopedic surgeon but the first opening is not until June 2023 so he cancelled it. He has severe sciatica, but does not want heavy pain medications.  Chronic pain in bilateral hips, orthpedic referral scheduled him for June 2023 so he cancelled it.  He is planning on going back to work soon and wants to be tested for THC A1C remains normal at 5.6.    Current Medication: Outpatient Encounter Medications as of 06/24/2021  Medication Sig   albuterol (ACCUNEB) 1.25 MG/3ML nebulizer solution Take 3 mLs (1.25 mg total) by nebulization every 6 (six) hours as needed for wheezing.   albuterol (VENTOLIN HFA) 108 (90 Base) MCG/ACT inhaler Inhale 2 puffs into the lungs every 6 (six) hours as needed for wheezing or shortness of breath.   amLODipine (NORVASC) 2.5 MG tablet Take 1 tablet (2.5 mg total) by mouth daily.   baclofen (LIORESAL) 10 MG tablet    doxycycline (VIBRA-TABS) 100 MG tablet Take 1 tablet (100 mg total) by mouth 2 (two) times daily. With food   EPINEPHrine (EPIPEN 2-PAK) 0.3 mg/0.3 mL IJ SOAJ injection Inject 0.3 mg into the muscle as needed for anaphylaxis.   meloxicam (MOBIC) 15 MG tablet TAKE 1 TABLET BY MOUTH ONCE DAILY WITH FOOD    mupirocin ointment (BACTROBAN) 2 % Apply 1 application topically 2 (two) times daily. to the affected area until resolved   predniSONE (DELTASONE) 10 MG tablet Take one tab 3 x day for 3 days, then take one tab 2 x a day for 3 days and then take one tab a day for 3 days for copd   tamsulosin (FLOMAX) 0.4 MG CAPS capsule Take 1 capsule (0.4 mg total) by mouth daily.   tiZANidine (ZANAFLEX) 4 MG tablet TAKE 1 TABLET BY MOUTH TWICE DAILY AS NEEDED FOR MUSCLE SPASMS   [DISCONTINUED] BREO ELLIPTA 100-25 MCG/INH AEPB INHALE 1 PUFF BY MOUTH ONCE DAILY   No facility-administered encounter medications on file as of 06/24/2021.    Surgical History: Past Surgical History:  Procedure Laterality Date   ANKLE SURGERY  2009   [place]   HERNIA REPAIR  [id number]   umbilical   LIPOMA EXCISION  02/16/2017   back/ Dr Bary Castilla   NECK SURGERY     WRIST SURGERY Left 2007    Medical History: Past Medical History:  Diagnosis Date   Back problem    COPD (chronic obstructive pulmonary disease) ([place])    Diabetes mellitus without complication ([place])    Hyperlipidemia     Family History: Family History  Problem Relation Age of Onset   Heart disease Mother    COPD Father  Asthma Father    Prostate cancer Neg Hx    Bladder Cancer Neg Hx    Kidney cancer Neg Hx     Social History   Socioeconomic History   Marital status: Married    Spouse name: Not on file   Number of children: Not on file   Years of education: Not on file   Highest education level: Not on file  Occupational History   Not on file  Tobacco Use   Smoking status: Every Day    Packs/day: 1.00    Years: 50.00    Pack years: 50.00    Types: Cigarettes   Smokeless tobacco: Never  Vaping Use   Vaping Use: Never used  Substance and Sexual Activity   Alcohol use: Yes    Comment: occasionally   Drug use: Not Currently    Types: Marijuana   Sexual activity: Yes    Birth control/protection: None  Other Topics Concern    Not on file  Social History Narrative   Not on file   Social Determinants of Health   Financial Resource Strain: Not on file  Food Insecurity: Not on file  Transportation Needs: Not on file  Physical Activity: Not on file  Stress: Not on file  Social Connections: Not on file  Intimate Partner Violence: Not on file      Review of Systems  Constitutional:  Negative for chills, fatigue and unexpected weight change.  HENT:  Negative for congestion, rhinorrhea, sneezing and sore throat.   Eyes:  Negative for redness.  Respiratory:  Negative for cough, chest tightness and shortness of breath.   Cardiovascular:  Negative for chest pain and palpitations.  Gastrointestinal:  Negative for abdominal pain, constipation, diarrhea, nausea and vomiting.  Genitourinary:  Negative for dysuria and frequency.  Musculoskeletal:  Negative for arthralgias, back pain, joint swelling and neck pain.  Skin:  Negative for rash.  Neurological: Negative.  Negative for dizziness, tremors, light-headedness, numbness and headaches.  Hematological:  Negative for adenopathy. Does not bruise/bleed easily.  Psychiatric/Behavioral:  Negative for behavioral problems (Depression), sleep disturbance and suicidal ideas. The patient is not nervous/anxious.    Vital Signs: BP (!) 170/70 Comment: 188/79  Pulse 61   Temp 98.6 F (37 C)   Resp 16   Ht 5\' 9"  (1.753 m)   Wt 149 lb 9.6 oz (67.9 kg)   SpO2 98%   BMI 22.09 kg/m    Physical Exam Vitals reviewed.  Constitutional:      General: He is not in acute distress.    Appearance: Normal appearance. He is normal weight. He is not ill-appearing.  HENT:     Head: Normocephalic and atraumatic.  Eyes:     Pupils: Pupils are equal, round, and reactive to light.  Cardiovascular:     Rate and Rhythm: Normal rate and regular rhythm.  Neurological:     Mental Status: He is alert and oriented to person, place, and time.     Cranial Nerves: No cranial nerve  deficit.     Coordination: Coordination normal.     Gait: Gait normal.  Psychiatric:        Mood and Affect: Mood normal.        Behavior: Behavior normal.       Assessment/Plan: Prediabetes A1C remains normal at 5.6.  Patient has not made any significant changes to his diet or lifestyle. No recommended changes at this time.  - POCT HgB A1C  2. Cellulitis of second toe of  right foot Empiric antibiotic treatment ordered. Consider referral to podiatry.  - doxycycline (VIBRA-TABS) 100 MG tablet; Take 1 tablet (100 mg total) by mouth 2 (two) times daily. With food  Dispense: 14 tablet; Refill: 0 - mupirocin ointment (BACTROBAN) 2 %; Apply 1 application topically 2 (two) times daily. to the affected area until resolved  Dispense: 30 g; Refill: 0  3. Essential hypertension Start amlodipine 2.5 mg daily. Follow up in 4 weeks.  - amLODipine (NORVASC) 2.5 MG tablet; Take 1 tablet (2.5 mg total) by mouth daily.  Dispense: 30 tablet; Refill: 2   General Counseling: Gearld verbalizes understanding of the findings of todays visit and agrees with plan of treatment. I have discussed any further diagnostic evaluation that may be needed or ordered today. We also reviewed his medications today. he has been encouraged to call the office with any questions or concerns that should arise related to todays visit.    Orders Placed This Encounter  Procedures   POCT HgB A1C    Meds ordered this encounter  Medications   amLODipine (NORVASC) 2.5 MG tablet    Sig: Take 1 tablet (2.5 mg total) by mouth daily.    Dispense:  30 tablet    Refill:  2   doxycycline (VIBRA-TABS) 100 MG tablet    Sig: Take 1 tablet (100 mg total) by mouth 2 (two) times daily. With food    Dispense:  14 tablet    Refill:  0   mupirocin ointment (BACTROBAN) 2 %    Sig: Apply 1 application topically 2 (two) times daily. to the affected area until resolved    Dispense:  30 g    Refill:  0    Return in about 1 month  (around 07/24/2021) for F/U, BP check, Namir Neto PCP and needs a nurse visit for a drug test on november 7th..   Total time spent:30 Minutes Time spent includes review of chart, medications, test results, and follow up plan with the patient.   [place] Controlled Substance Database was reviewed by me.  This patient was seen by Jonetta Osgood, FNP-C in collaboration with Dr. Clayborn Bigness as a part of collaborative care agreement.   Aries Townley R. Valetta Fuller, MSN, FNP-C Internal medicine

## 2021-06-26 ENCOUNTER — Other Ambulatory Visit: Payer: Self-pay | Admitting: Internal Medicine

## 2021-06-26 DIAGNOSIS — J449 Chronic obstructive pulmonary disease, unspecified: Secondary | ICD-10-CM

## 2021-06-30 ENCOUNTER — Other Ambulatory Visit: Payer: Self-pay

## 2021-06-30 ENCOUNTER — Ambulatory Visit: Payer: Self-pay

## 2021-06-30 DIAGNOSIS — Z79899 Other long term (current) drug therapy: Secondary | ICD-10-CM

## 2021-06-30 LAB — POCT URINE DRUG SCREEN
Methylenedioxyamphetamine: NOT DETECTED
POC Amphetamine UR: NOT DETECTED
POC BENZODIAZEPINES UR: NOT DETECTED
POC Barbiturate UR: NOT DETECTED
POC Cocaine UR: NOT DETECTED
POC Ecstasy UR: NOT DETECTED
POC Marijuana UR: NOT DETECTED
POC Methadone UR: NOT DETECTED
POC Methamphetamine UR: NOT DETECTED
POC Opiate Ur: NOT DETECTED
POC Oxycodone UR: NOT DETECTED
POC PHENCYCLIDINE UR: NOT DETECTED
POC TRICYCLICS UR: NOT DETECTED

## 2021-07-01 NOTE — Progress Notes (Signed)
Pt doing a drug screen for work, gave patient the original and a copy of the results before leaving clinic.   Also gave patient samples of Breztri for his wife that is a patient at our office.

## 2021-07-09 ENCOUNTER — Telehealth: Payer: Self-pay

## 2021-07-09 DIAGNOSIS — L03031 Cellulitis of right toe: Secondary | ICD-10-CM

## 2021-07-10 NOTE — Telephone Encounter (Signed)
1. Cellulitis of second toe of right foot Recurrent cellulitis due to ingrown toe nail  - Ambulatory referral to Podiatry

## 2021-07-11 ENCOUNTER — Encounter: Payer: Self-pay | Admitting: Podiatry

## 2021-07-11 ENCOUNTER — Other Ambulatory Visit: Payer: Self-pay

## 2021-07-11 ENCOUNTER — Ambulatory Visit: Payer: Medicare HMO | Admitting: Podiatry

## 2021-07-11 DIAGNOSIS — L600 Ingrowing nail: Secondary | ICD-10-CM | POA: Diagnosis not present

## 2021-07-15 NOTE — Telephone Encounter (Signed)
Pt had diarrhea nausea and cramps for 8 days and has stopped taking BP meds.

## 2021-07-17 ENCOUNTER — Encounter: Payer: Self-pay | Admitting: Nurse Practitioner

## 2021-07-22 NOTE — Progress Notes (Signed)
   HPI: 75 y.o. male presenting today for evaluation of a symptomatic toenail to the left second digit.  Patient believes it is possibly infected because it is very painful.  He tries to take the toenail out himself.  He presents for further treatment and evaluation  Past Medical History:  Diagnosis Date   Back problem    COPD (chronic obstructive pulmonary disease) ([place])    Diabetes mellitus without complication ([place])    Hyperlipidemia      Physical Exam: General: The patient is alert and oriented x3 in no acute distress.  Dermatology: Skin is warm, dry and supple bilateral lower extremities. Negative for open lesions or macerations.  Thickened dystrophic nail noted to the left second toe which appears to be intruding into the periungual nailfold  Vascular: Palpable pedal pulses bilaterally. No edema or erythema noted. Capillary refill within normal limits.  Neurological: Epicritic and protective threshold grossly intact bilaterally.   Musculoskeletal Exam: No pedal deformities noted   Assessment: 1.  Ingrowing toenail left second digit   Plan of Care:  1. Patient evaluated. 2.  Mechanical debridement of the offending border of the nail was performed as a courtesy for the patient.  Without incident or bleeding.  Patient felt relief of the toenail 3.  Recommend wide fitting shoes that do not constrict the toebox area 4.  Return to clinic as needed      Edrick Kins, DPM [facility]  Dr. Edrick Kins, DPM    [address], [place] [address]                Office [phone]  Fax [phone]

## 2021-07-29 ENCOUNTER — Encounter: Payer: Self-pay | Admitting: Nurse Practitioner

## 2021-07-29 ENCOUNTER — Other Ambulatory Visit: Payer: Self-pay

## 2021-07-29 ENCOUNTER — Ambulatory Visit (INDEPENDENT_AMBULATORY_CARE_PROVIDER_SITE_OTHER): Payer: Medicare HMO | Admitting: Nurse Practitioner

## 2021-07-29 VITALS — BP 138/70 | HR 96 | Temp 97.8°F | Resp 16 | Ht 69.0 in | Wt 153.0 lb

## 2021-07-29 DIAGNOSIS — I10 Essential (primary) hypertension: Secondary | ICD-10-CM | POA: Diagnosis not present

## 2021-07-29 DIAGNOSIS — J449 Chronic obstructive pulmonary disease, unspecified: Secondary | ICD-10-CM | POA: Diagnosis not present

## 2021-07-29 DIAGNOSIS — R69 Illness, unspecified: Secondary | ICD-10-CM | POA: Diagnosis not present

## 2021-07-29 DIAGNOSIS — F17200 Nicotine dependence, unspecified, uncomplicated: Secondary | ICD-10-CM

## 2021-07-29 NOTE — Progress Notes (Signed)
[facility] [place], [place] [address]  Internal MEDICINE  Office Visit Note  Patient Name: Brian Key  [id number]  [id number]  Date of Service: 07/29/2021  Chief Complaint  Patient presents with   Follow-up    BP   Hypertension    HPI Brian Key presents for a follow-up visit for elevated blood pressure.  At his previous visit his blood pressure was elevated and he was started on amlodipine.  He agreed to give the medication 5 to 10 days to see if it would help his blood pressure and not cause any significant adverse side effects.  He reports that he was on the medication and it was causing diarrhea for a day so he stopped taking it.  His blood pressure is normal today, he is not interested in trying any new medications for his blood pressure.  He reports that he will periodically check his blood pressure and if he notices that it is significantly elevated consistently he will call the clinic to discuss trying something to control his blood pressure.  Brian Key does have 1 specific concern about jitteriness that he has experienced with taking Breo Ellipta due to the inhaled steroid in the medication.  He would like to try something different that may eliminate the jitteriness.    Current Medication: Outpatient Encounter Medications as of 07/29/2021  Medication Sig   albuterol (ACCUNEB) 1.25 MG/3ML nebulizer solution Take 3 mLs (1.25 mg total) by nebulization every 6 (six) hours as needed for wheezing.   albuterol (VENTOLIN HFA) 108 (90 Base) MCG/ACT inhaler Inhale 2 puffs into the lungs every 6 (six) hours as needed for wheezing or shortness of breath.   baclofen (LIORESAL) 10 MG tablet    BREO ELLIPTA 100-25 MCG/ACT AEPB INHALE 1 PUFF BY MOUTH ONCE DAILY   doxycycline (VIBRA-TABS) 100 MG tablet Take 1 tablet (100 mg total) by mouth 2 (two) times daily. With food   EPINEPHrine (EPIPEN 2-PAK) 0.3 mg/0.3 mL IJ SOAJ injection Inject 0.3 mg into the muscle as needed  for anaphylaxis.   meloxicam (MOBIC) 15 MG tablet TAKE 1 TABLET BY MOUTH ONCE DAILY WITH FOOD   mupirocin ointment (BACTROBAN) 2 % Apply 1 application topically 2 (two) times daily. to the affected area until resolved   predniSONE (DELTASONE) 10 MG tablet Take one tab 3 x day for 3 days, then take one tab 2 x a day for 3 days and then take one tab a day for 3 days for copd   tamsulosin (FLOMAX) 0.4 MG CAPS capsule Take 1 capsule (0.4 mg total) by mouth daily.   tiZANidine (ZANAFLEX) 4 MG tablet TAKE 1 TABLET BY MOUTH TWICE DAILY AS NEEDED FOR MUSCLE SPASMS   [DISCONTINUED] amLODipine (NORVASC) 2.5 MG tablet Take 1 tablet (2.5 mg total) by mouth daily.   No facility-administered encounter medications on file as of 07/29/2021.    Surgical History: Past Surgical History:  Procedure Laterality Date   ANKLE SURGERY  2009   Brian Key   HERNIA REPAIR  7939   umbilical   LIPOMA EXCISION  02/16/2017   back/ Dr Brian Key   NECK SURGERY     WRIST SURGERY Left 2007    Medical History: Past Medical History:  Diagnosis Date   Back problem    COPD (chronic obstructive pulmonary disease) ([place])    Diabetes mellitus without complication ([place])    Hyperlipidemia     Family History: Family History  Problem Relation Age of Onset  Heart disease Mother    COPD Father    Asthma Father    Prostate cancer Neg Hx    Bladder Cancer Neg Hx    Kidney cancer Neg Hx     Social History   Socioeconomic History   Marital status: Married    Spouse name: Not on file   Number of children: Not on file   Years of education: Not on file   Highest education level: Not on file  Occupational History   Not on file  Tobacco Use   Smoking status: Every Day    Packs/day: 1.00    Years: 50.00    Pack years: 50.00    Types: Cigarettes   Smokeless tobacco: Never  Vaping Use   Vaping Use: Never used  Substance and Sexual Activity   Alcohol use: Yes    Comment: occasionally   Drug use: Not  Currently    Types: Marijuana   Sexual activity: Yes    Birth control/protection: None  Other Topics Concern   Not on file  Social History Narrative   Not on file   Social Determinants of Health   Financial Resource Strain: Not on file  Food Insecurity: Not on file  Transportation Needs: Not on file  Physical Activity: Not on file  Stress: Not on file  Social Connections: Not on file  Intimate Partner Violence: Not on file      Review of Systems  Constitutional:  Negative for chills, fatigue and unexpected weight change.  HENT:  Negative for congestion, rhinorrhea, sneezing and sore throat.   Eyes:  Negative for redness.  Respiratory:  Negative for cough, chest tightness and shortness of breath.   Cardiovascular:  Negative for chest pain and palpitations.  Gastrointestinal:  Negative for abdominal pain, constipation, diarrhea, nausea and vomiting.  Genitourinary:  Negative for dysuria and frequency.  Musculoskeletal:  Negative for arthralgias, back pain, joint swelling and neck pain.  Skin:  Negative for rash.  Neurological: Negative.  Negative for dizziness, tremors, light-headedness, numbness and headaches.  Hematological:  Negative for adenopathy. Does not bruise/bleed easily.  Psychiatric/Behavioral:  Negative for behavioral problems (Depression), sleep disturbance and suicidal ideas. The patient is not nervous/anxious.    Vital Signs: BP 138/70   Pulse 96   Temp 97.8 F (36.6 C)   Resp 16   Ht 5\' 9"  (1.753 m)   Wt 153 lb (69.4 kg)   SpO2 99%   BMI 22.59 kg/m    Physical Exam Vitals reviewed.  Constitutional:      General: He is not in acute distress.    Appearance: Normal appearance. He is normal weight. He is not ill-appearing.  HENT:     Head: Normocephalic and atraumatic.  Eyes:     Pupils: Pupils are equal, round, and reactive to light.  Cardiovascular:     Rate and Rhythm: Normal rate and regular rhythm.  Neurological:     Mental Status: He is  alert and oriented to person, place, and time.     Cranial Nerves: No cranial nerve deficit.     Coordination: Coordination normal.     Gait: Gait normal.  Psychiatric:        Mood and Affect: Mood normal.        Behavior: Behavior normal.       Assessment/Plan: 1. Essential hypertension Continue to  monitor periodically, patient is not interested in trying any medication at this time.   2. Chronic obstructive pulmonary disease, unspecified COPD type ([place])  Patient wants to try a different inhaler.  Samples were provided to patient for Breztri x1 week and Anoro Ellipta x1 week patient will try each 1 for 7 days apiece and then he will call the clinic to let me know which inhaler he likes better to replace the [place].  3. Nicotine dependence with current use He is not interested in stopping his tobacco use.  He declined smoking cessation counseling.   General Counseling: Casimiro Needle understanding of the findings of todays visit and agrees with plan of treatment. I have discussed any further diagnostic evaluation that may be needed or ordered today. We also reviewed his medications today. he has been encouraged to call the office with any questions or concerns that should arise related to todays visit.    No orders of the defined types were placed in this encounter.   No orders of the defined types were placed in this encounter.   Return for CPE, previously scheduled, Aleksandra Raben PCP.   Total time spent:30 Minutes Time spent includes review of chart, medications, test results, and follow up plan with the patient.   [place] Controlled Substance Database was reviewed by me.  This patient was seen by Jonetta Osgood, FNP-C in collaboration with Dr. Clayborn Bigness as a part of collaborative care agreement.   Daniya Aramburo R. Valetta Fuller, MSN, FNP-C Internal medicine

## 2021-07-30 ENCOUNTER — Encounter: Payer: Self-pay | Admitting: Nurse Practitioner

## 2021-08-05 ENCOUNTER — Other Ambulatory Visit: Payer: Self-pay

## 2021-08-05 MED ORDER — ANORO ELLIPTA 62.5-25 MCG/ACT IN AEPB: 1.0000 | INHALATION_SPRAY | Freq: Every day | RESPIRATORY_TRACT | 1 refills | Status: DC

## 2021-08-05 NOTE — Telephone Encounter (Signed)
Pt wife called that pt like ANORO he was doing good with no side effect as per alyssa send pres to [facility] and also lmom to pt that we send pres for [facility] to [facility]

## 2021-08-26 ENCOUNTER — Telehealth: Payer: Self-pay | Admitting: Internal Medicine

## 2021-08-26 NOTE — Chronic Care Management (AMB) (Signed)
°  Chronic Care Management   Note  08/26/2021 Name: Brian Key MRN: [id number] DOB: 10-12-1945  Brian Key is a 76 y.o. year old male who is a primary care patient of Lavera Guise, MD. I reached out to Brian Key by phone today in response to a referral sent by Brian Key PCP, Lavera Guise, MD.   Mr. Topper was given information about Chronic Care Management services today including:  CCM service includes personalized support from designated clinical staff supervised by his physician, including individualized plan of care and coordination with other care providers 24/7 contact phone numbers for assistance for urgent and routine care needs. Service will only be billed when office clinical staff spend 20 minutes or more in a month to coordinate care. Only one practitioner may furnish and bill the service in a calendar month. The patient may stop CCM services at any time (effective at the end of the month) by phone call to the office staff.   Patient agreed to services and verbal consent obtained.   Follow up plan:   Tatjana Secretary/administrator

## 2021-08-27 ENCOUNTER — Telehealth: Payer: Medicare HMO

## 2021-09-07 ENCOUNTER — Other Ambulatory Visit: Payer: Self-pay

## 2021-09-07 ENCOUNTER — Emergency Department: Payer: Medicare HMO

## 2021-09-07 ENCOUNTER — Emergency Department
Admission: EM | Admit: 2021-09-07 | Discharge: 2021-09-07 | Disposition: A | Discharge status: Home / Self Care | Payer: Medicare HMO | Attending: Emergency Medicine | Admitting: Emergency Medicine

## 2021-09-07 ENCOUNTER — Encounter: Payer: Self-pay | Admitting: Emergency Medicine

## 2021-09-07 DIAGNOSIS — W28XXXA Contact with powered lawn mower, initial encounter: Secondary | ICD-10-CM | POA: Insufficient documentation

## 2021-09-07 DIAGNOSIS — J449 Chronic obstructive pulmonary disease, unspecified: Secondary | ICD-10-CM | POA: Diagnosis not present

## 2021-09-07 DIAGNOSIS — S62633A Displaced fracture of distal phalanx of left middle finger, initial encounter for closed fracture: Secondary | ICD-10-CM | POA: Insufficient documentation

## 2021-09-07 DIAGNOSIS — S6992XA Unspecified injury of left wrist, hand and finger(s), initial encounter: Secondary | ICD-10-CM | POA: Diagnosis present

## 2021-09-07 DIAGNOSIS — M439 Deforming dorsopathy, unspecified: Secondary | ICD-10-CM | POA: Diagnosis not present

## 2021-09-07 DIAGNOSIS — S61211A Laceration without foreign body of left index finger without damage to nail, initial encounter: Secondary | ICD-10-CM | POA: Diagnosis not present

## 2021-09-07 DIAGNOSIS — S61217A Laceration without foreign body of left little finger without damage to nail, initial encounter: Secondary | ICD-10-CM | POA: Diagnosis not present

## 2021-09-07 DIAGNOSIS — E119 Type 2 diabetes mellitus without complications: Secondary | ICD-10-CM | POA: Diagnosis not present

## 2021-09-07 DIAGNOSIS — S61213A Laceration without foreign body of left middle finger without damage to nail, initial encounter: Secondary | ICD-10-CM | POA: Diagnosis not present

## 2021-09-07 DIAGNOSIS — S62635A Displaced fracture of distal phalanx of left ring finger, initial encounter for closed fracture: Secondary | ICD-10-CM | POA: Diagnosis not present

## 2021-09-07 MED ORDER — HYDROMORPHONE HCL 1 MG/ML PO LIQD
0.5000 mg | Freq: Once | ORAL | Status: DC
  Filled 2021-09-07: qty 1

## 2021-09-07 MED ORDER — LIDOCAINE HCL (PF) 1 % IJ SOLN
5.0000 mL | Freq: Once | INTRAMUSCULAR | Status: AC
Start: 2021-09-07 — End: 2021-09-07
  Administered 2021-09-07: 5 mL

## 2021-09-07 MED ORDER — CEFAZOLIN SODIUM-DEXTROSE 1-4 GM/50ML-% IV SOLN
1.0000 g | Freq: Once | INTRAVENOUS | Status: DC
  Filled 2021-09-07: qty 50

## 2021-09-07 MED ORDER — HYDROMORPHONE HCL 1 MG/ML IJ SOLN
0.5000 mg | Freq: Once | INTRAMUSCULAR | Status: DC
  Filled 2021-09-07: qty 1

## 2021-09-07 MED ORDER — LIDOCAINE HCL (PF) 1 % IJ SOLN
30.0000 mL | Freq: Once | INTRAMUSCULAR | Status: DC
  Filled 2021-09-07: qty 30

## 2021-09-07 MED ORDER — LIDOCAINE HCL (PF) 1 % IJ SOLN
5.0000 mL | Freq: Once | INTRAMUSCULAR | Status: AC
Start: 2021-09-07 — End: 2021-09-07
  Administered 2021-09-07: 5 mL
  Filled 2021-09-07: qty 5

## 2021-09-07 MED ORDER — HYDROMORPHONE HCL 1 MG/ML IJ SOLN
1.0000 mg | Freq: Once | INTRAMUSCULAR | Status: AC
  Administered 2021-09-07: 1 mg via INTRAVENOUS

## 2021-09-07 MED ORDER — LIDOCAINE HCL (PF) 1 % IJ SOLN
30.0000 mL | Freq: Once | INTRAMUSCULAR | Status: AC
Start: 2021-09-07 — End: 2021-09-07
  Administered 2021-09-07: 30 mL
  Filled 2021-09-07: qty 30

## 2021-09-07 MED ORDER — OXYCODONE HCL 5 MG PO TABS: 5.0000 mg | ORAL_TABLET | Freq: Three times a day (TID) | ORAL | 0 refills | Status: AC | PRN

## 2021-09-07 MED ORDER — CEPHALEXIN 500 MG PO CAPS: 500.0000 mg | ORAL_CAPSULE | Freq: Four times a day (QID) | ORAL | 0 refills | Status: AC

## 2021-09-07 MED ORDER — CEPHALEXIN 500 MG PO CAPS
500.0000 mg | ORAL_CAPSULE | Freq: Once | ORAL | Status: AC
  Administered 2021-09-07: 500 mg via ORAL
  Filled 2021-09-07: qty 1

## 2021-09-07 NOTE — ED Provider Notes (Signed)
[facility] Provider Note    Event Date/Time   First MD Initiated Contact with Patient 09/07/21 1237     (approximate)   History   Laceration   HPI  Brian Key is a 76 y.o. male with past medical history of diabetes and COPD who presents with injury to the left fingers.  Patient put his hand in the lawnmower thinking that it was off unfortunately was not.  Sustained injury to the third fourth and fifth digits.  He is up-to-date on tetanus.    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 --      Excl. in [place]? --     Most recent vital signs: Vitals:   09/07/21 1236 09/07/21 1329  BP: (!) 134/95 (!) 195/88  Pulse: (!) 106 67  Resp: (!) 26 18  Temp: 98.1 F (36.7 C)   SpO2: 94% 96%     General: Awake, no distress.  CV:  Good peripheral perfusion.  Resp:  Normal effort.  Abd:  No distention.   Neuro:             Awake, Alert, Oriented x 3  Other:  Left hand pictured below   Third digit with maceration of the nailbed, no obvious exposed bone fragments Fourth digit with loss of the distal phalanx with significant exposed skin flap    ED Results / Procedures / Treatments  Labs (all labs ordered are listed, but only abnormal results are displayed) Labs Reviewed - No data to display   EKG     RADIOLOGY Reviewed the x-ray of the left hand which shows fracture of the distal phalanx third digit, almost complete loss of the distal phalanx of the fourth digit and a well approximated fracture of the fifth digit   PROCEDURES:  Critical Care performed: No  ..Laceration Repair  Date/Time: 09/07/2021 4:27 PM Performed by: Rada Hay, MD Authorized by: Rada Hay, MD   Consent:    Consent obtained:  Verbal   Risks discussed:  Infection and pain Universal protocol:  Patient identity confirmed:  Verbally with patient Anesthesia:    Anesthesia method:  Nerve block   Block needle gauge:  27 G   Block anesthetic:  Lidocaine 1% w/o epi   Block technique:  Digital   Block injection procedure:  Anatomic landmarks identified   Block outcome:  Anesthesia achieved Laceration details:    Location:  Finger   Finger location:  L long finger   Length (cm):  4 Exploration:    Limited defect created (wound extended): no     Hemostasis achieved with:  Direct pressure   Imaging obtained: x-ray     Imaging outcome: foreign body not noted     Wound extent: fascia violated, foreign bodies/material, muscle damage, nerve damage, tendon damage and underlying fracture     Tendon damage location:  Upper extremity   Upper extremity tendon damage location:  Finger extensor and finger flexor   Tendon damage extent:  Complete transection   Tendon repair plan:  Refer for evaluation   Contaminated: yes   Treatment:    Area cleansed with:  Soap and water   Amount of cleaning:   Extensive   Irrigation solution:  Tap water   Irrigation volume:  500   Irrigation method:  Tap   Debridement:  Minimal   Undermining:  None   Scar revision: no   Skin repair:    Repair method:  Sutures   Suture technique:  Simple interrupted   Number of sutures:  9 Approximation:    Approximation:  Close Repair type:    Repair type:  Intermediate Post-procedure details:    Dressing:  Non-adherent dressing   Procedure completion:  Tolerated  The patient is on the cardiac monitor to evaluate for evidence of arrhythmia and/or significant heart rate changes.   MEDICATIONS ORDERED IN ED: Medications  ceFAZolin (ANCEF) IVPB 1 g/50 mL premix (1 g Intravenous Patient Refused/Not Given 09/07/21 1546)  lidocaine (PF) (XYLOCAINE) 1 % injection 5 mL (5 mLs Infiltration Given by Other 09/07/21 1549)  lidocaine (PF) (XYLOCAINE) 1 % injection 5 mL (5 mLs Infiltration Given by Other 09/07/21 1549)  lidocaine (PF) (XYLOCAINE) 1 % injection 5 mL (5 mLs Other Given by Other 09/07/21 1548)  HYDROmorphone (DILAUDID) injection 1 mg (1 mg Intravenous Given 09/07/21 1333)  lidocaine (PF) (XYLOCAINE) 1 % injection 30 mL (30 mLs Infiltration Given by Other 09/07/21 1548)  cephALEXin (KEFLEX) capsule 500 mg (500 mg Oral Given 09/07/21 1547)     IMPRESSION / MDM / ASSESSMENT AND PLAN / ED COURSE  I reviewed the triage vital signs and the nursing notes.                              Differential diagnosis includes, but is not limited to, fingertip injury, laceration, fracture, tendon injury  A 76 year old male presenting with fingertip injury to the finger tips of the third through fifth digits putting his hand in the lawnmower that he thought was off.  The third digit has an obvious nailbed injury, nailbed is macerated, no flaps or obvious exposed bone or tendon.  X-ray shows a comminuted fracture of the distal phalanx of the third digit.  The fourth digit has significant skin flap and on x-ray the bone is  nearly all gone.  I was able to cover the fingertip with the skin flap.  Fifth digit also has a fracture of the distal phalanx although there is no open wound.  Discussed  the case with orthopedist on-call who agrees to see the patient sometime this week.  Patient's tetanus was already up-to-date.  He was supposed to get a dose of IV Ancef while in the ED but unfortunately he removed his IV prior to getting this and did not want to stay to have it replaced.  He was then given a dose of p.o. Keflex and discharged on Keflex.   FINAL CLINICAL IMPRESSION(S) / ED DIAGNOSES   Final diagnoses:  Injury of tip of finger of left hand, initial encounter     Rx / DC Orders   ED Discharge Orders          Ordered    oxyCODONE (ROXICODONE) 5 MG immediate release tablet  Every 8 hours PRN        09/07/21 1524    cephALEXin (KEFLEX) 500 MG capsule  4 times daily        09/07/21 1524             Note:  This document was prepared using Dragon voice recognition software and may include unintentional dictation errors.   Rada Hay, MD 09/07/21 [phone]

## 2021-09-07 NOTE — ED Triage Notes (Signed)
Pt via POV from home. Pt states he cut his L middle and L ring finger on a lawn mower. Pt is very anxious in the lobby. Pt is A&OX4 and NAD. Bleeding controlled.

## 2021-09-07 NOTE — ED Notes (Signed)
Patient reports removing his own IV. Site dry. Patient refused his IV antibiotics and outside smoking cigarette. Patient agreed to take keflex if brought to him. This RN took keflex to patient and he placed it in his pocket, refusing to take it infront of RN

## 2021-09-07 NOTE — Discharge Instructions (Addendum)
Please call the orthopedic office tomorrow so that you can be set up for an appointment next week.  Please take the antibiotic.  You can take oxycodone as needed for breakthrough pain.  You should take Tylenol and Motrin as well.

## 2021-09-08 DIAGNOSIS — S62634B Displaced fracture of distal phalanx of right ring finger, initial encounter for open fracture: Secondary | ICD-10-CM | POA: Diagnosis not present

## 2021-09-08 DIAGNOSIS — S62633B Displaced fracture of distal phalanx of left middle finger, initial encounter for open fracture: Secondary | ICD-10-CM | POA: Diagnosis not present

## 2021-09-18 DIAGNOSIS — S62634B Displaced fracture of distal phalanx of right ring finger, initial encounter for open fracture: Secondary | ICD-10-CM | POA: Diagnosis not present

## 2021-09-18 DIAGNOSIS — S62633B Displaced fracture of distal phalanx of left middle finger, initial encounter for open fracture: Secondary | ICD-10-CM | POA: Diagnosis not present

## 2021-10-08 ENCOUNTER — Other Ambulatory Visit: Payer: Self-pay

## 2021-10-08 ENCOUNTER — Telehealth: Payer: Medicare HMO

## 2021-10-08 ENCOUNTER — Ambulatory Visit: Payer: Medicare HMO | Admitting: Student-PharmD

## 2021-10-08 DIAGNOSIS — J449 Chronic obstructive pulmonary disease, unspecified: Secondary | ICD-10-CM

## 2021-10-08 DIAGNOSIS — I10 Essential (primary) hypertension: Secondary | ICD-10-CM

## 2021-10-08 DIAGNOSIS — E785 Hyperlipidemia, unspecified: Secondary | ICD-10-CM

## 2021-10-08 NOTE — Progress Notes (Signed)
Initial Pharmacist Visit  Brian Key, Shankel [id number] 83 years, Male  DOB: 15-Aug-1946  M: [phone]  Clinical Summary Patient Risk: Moderate Next CCM Follow Up: 06/10/2022 Next AWV: 02/06/22 Next PCP Visit: 02/06/22 Summary for PCP: Patient presents via telephone. Declines any statin or BP therapy for his HTN/HLD. Will monitor for now. Patient states he may consider a statin therapy if cholesterol still elevated at next check. Patient is still experiencing some jitteriness after switching from [facility] to [facility]. Instructed patient to contact office for an appt if this does not improve or worsens.  Visit Details Patient scheduled for CCM visit with the clinical pharmacist.  Patient is referred for CCM by their PCP and CPP is under general PCP supervision.: At least 2 of these conditions are expected to last 12 months or longer and patient is at significant risk for acute exacerbations and/or functional decline.  Patient has consented to participation in [place] program. Visit Type: Phone Call Date of Visit: 10/08/2021  Patient Chart Prep  Completed by Charlann Lange on 10/06/2021  Chronic Conditions Patient's Chronic Conditions: Chronic Obstructive Pulmonary Disease (COPD), Hypertension (HTN), Hyperlipidemia/Dyslipidemia (HLD)  Doctor and Hospital Visits Were there PCP Visits in last 6 months?: Yes Visit #1: 06/24/21 Jonetta Osgood, NP. For uncontrolled type 2 diabetes mellitus. STARTED Amlodipine Besylate 2.5 mg daily, Doxycycline Hyclate 100 mg 2 times daily with food, and Mupirocin 2% 1 application topical 2 times daily to the affected are until resolved. Visit #2: 07/29/21 Jonetta Osgood, NP. For hypertension. STOPPED Amlodipine (per patient). Were there Specialist Visits in last 6 months. Were there Specialist Visits in last 6 months?: Yes Visit #1: 07/11/21 Podiatry Edrick Kins, DPM. For ingrowing toenail. No medication changes. Was there a Hospital Visit in last 30 days?:  Yes Reason for admission: Injury of tip of finger of left hand Admit Date: 09/07/2021 Discharge Date: 09/07/2021 Location Discharged from: [place] Medications Started at hospital discharge (list medications started): STARTED Cephalexin 500 mg 4 times daily, Oxycodone 5 mg every 8 hours PRN. Medication Changes at hospital discharge (list medications changed): None. Medications Discontinued at hospital discharge (list medications discontinued): None. Medications that remain the same after Hospital Discharge (list medications continued): Same. Were there other Hospital Visits in last 6 months?: No  Medication Information Are there any Medication discrepancies?: No Are there any Medication adherence gaps (beyond 5 days past due)?: No Medication adherence rates for the STAR rating drugs: None. List Patient's current Care Gaps: No current Care Gaps identified  Pre-Call Questions  Completed by Charlann Lange on 10/07/2021  Are you able to connect with Patient: Yes Confirmed appointment date/time with patient/caregiver?: Yes Date/time of the appointment: 10/07/21 at 8:30 AM Visit type: Phone Patient/Caregiver instructed to bring medications to appointment: Yes What, if any, problems do you have getting your medications from the pharmacy?: None What is your top health concern to discuss at your upcoming visit?: Patient stated none. Ask him how his finger is healing. Have you seen any other providers since your last visit?: No  Disease Assessments  Subjective Information Current BP: 138/70 Current HR: 96 taken on: 07/29/2021 Previous BP: 170/70 Previous HR: 61 taken on: 06/23/2021 Weight: 153 BMI: 22.59 Last GFR: 89 taken on: 02/11/2021 Why did the patient present?: CCM Initial Visit Marital status?: Married Retired? Previous work?: Currently working, operates heavy [employer] that may affect medication adherence?:  Medication side effects  Hypertension (HTN) Assess this condition today?: Yes Is patient able to obtain  BP reading today?: No Goal: <130/80 mmHG Hypertension Stage: Stage 1 (SBP: 130-139 or DBP: 80-89) Is Patient checking BP at home?: No How often does patient miss taking their blood pressure medications?: no medications Has patient experienced hypotension, dizziness, falls or bradycardia?: No BP RPM device: Does patient qualify?: No We discussed: Targeting 150 minutes of aerobic activity per week Assessment:: Controlled Drug: Diet and Exercise Assessment: Appropriate, Effective, Safe, Accessible Additional Info: Patient has a history of elevated BPs but was stable at last visit. Patient was prescribed amlodipine but stopped taking due to stomach upset. Patient is not open to trying any BP medications currently and we will continue to monitor. Plan to: Monitor BP HC Follow up: 1 month, 4 months Pharmacist Follow up: 8 months  Hyperlipidemia/Dyslipidemia (HLD) Last Lipid panel on: 02/11/2021 TC (Goal<200): 88 LDL: 152 HDL (Goal>40): 43 TG (Goal<150): 88 ASCVD 10-year risk?is:: High (>20%) ASCVD Risk Score: 27.9 % Assess this condition today?: Yes LDL Goal: <100 Has patient tried and failed any HLD Medications?: Yes Medications failed: atorvastatin atorvastatin: myopathy We discussed: How to reduce cholesterol through diet/weight management and physical activity., How a diet high in plant sterols (fruits/vegetables/nuts/whole grains/legumes) may reduce your cholesterol. Assessment:: Uncontrolled Drug: No medication Assessment: Query need Additional Info: Patient declines statin treatment at this time. Says he will consider if elevated at next check Plan to: Monitor with lipid panel at next office visit HC Follow up: 1 month, 4 months Pharmacist Follow up: 8 months  Chronic Obstructive Pulmonary Disease (COPD) Current Eosinophils: 3 taken on: 02/11/2021 Assess this condition  today?: Yes Gold group: A (low sx, < 2 exacerbations / yr) Exacerbations in past year with hospitalization: No Frequency of SABA/SAMA use: Infrequently Assessment:: Controlled Drug: Anoro 1 puff daily Assessment: Appropriate, Effective, Safe, Accessible Additional Info: Patient said he is still experiencing jitters while using the anoro (experienced on breo as well, this was a replacement). Counseled patient to contact office for an appt if jitters do not improve or worsen. Plan to: Continue medication therapy HC Follow up: 1 month, 4 months Pharmacist Follow up: 8 months  Exercise, Diet and Non-Drug Coordination Needs Additional exercise counseling points. We discussed: decreasing sedentary behavior Additional diet counseling points. We discussed: aiming to consume at least 8 cups of water day Discussed Non-Drug Care Coordination Needs: Yes Does Patient have Medication financial barriers?: No  Accountable Health Communities Health-Related Social Needs Screening Tool -  SDOH  What is your living situation today? (ref #1): I have a steady place to live Think about the place you live. Do you have problems with any of the following? (ref #2): None of the above Within the past 12 months, you worried that your food would run out before you got money to buy more (ref #3): Never true Within the past 12 months, the food you bought just didn't last and you didn't have money to get more (ref #4): Never true In the past 12 months, has lack of reliable transportation kept you from medical appointments, meetings, work or from getting things needed for daily living? (ref #5): No In the past 12 months, has the electric, gas, oil, or water company threatened to shut off services in your home? (ref #6): No How often does anyone, including family and friends, physically hurt you? (ref #7): Never (1) How often does anyone, including family and friends, insult or talk down to you? (ref #8): Never (1) How  often does anyone, including friends and family, threaten you with harm? (  ref #9): Never (1) How often does anyone, including family and friends, scream or curse at you? (ref #10): Never (1)  How to Take Your Blood Pressure Blood pressure measures how strongly your blood is pressing against the walls of your arteries. Arteries are blood vessels that carry blood from your heart throughout your body. You can take your blood pressure at home with a machine. You may need to check your blood pressure at home: To check if you have high blood pressure (hypertension). To check your blood pressure over time. To make sure your blood pressure medicine is working. Supplies needed: Blood pressure machine, or monitor. Dining room chair to sit in. Table or desk. Small notebook. Pencil or pen. How to prepare Avoid these things for 30 minutes before checking your blood pressure: Having drinks with caffeine in them, such as coffee or tea. Drinking alcohol. Eating. Smoking. Exercising. Do these things five minutes before checking your blood pressure: Go to the bathroom and pee (urinate). Sit in a dining chair. Do not sit on a soft couch or an armchair. Be quiet. Do not talk. How to take your blood pressure Follow the instructions that came with your machine. If you have a digital blood pressure monitor, these may be the instructions: Sit up straight. Place your feet on the floor. Do not cross your ankles or legs. Rest your left arm at the level of your heart. You may rest it on a table, desk, or chair. Pull up your shirt sleeve. Wrap the blood pressure cuff around the upper part of your left arm. The cuff should be 1 inch (2.5 cm) above your elbow. It is best to wrap the cuff around bare skin. Fit the cuff snugly around your arm. You should be able to place only one finger between the cuff and your arm. Place the cord so that it rests in the bend of your elbow. Press the power button. Sit quietly  while the cuff fills with air and loses air. Write down the numbers on the screen. Wait 2-3 minutes and then repeat steps 1-10. What do the numbers mean? Two numbers make up your blood pressure. The first number is called systolic pressure. The second is called diastolic pressure. An example of a blood pressure reading is "120 over 80" (or 120/80). If you are an adult and do not have a medical condition, use this guide to find out if your blood pressure is normal: Normal First number: below 120. Second number: below 80. Elevated First number: 120-129. Second number: below 80. Hypertension stage 1 First number: 130-139. Second number: 80-89. Hypertension stage 2 First number: 140 or above. Second number: 93 or above. Your blood pressure is above normal even if only the first or only the second number is above normal. Follow these instructions at home: Medicines Take over-the-counter and prescription medicines only as told by your doctor. Tell your doctor if your medicine is causing side effects. General instructions Check your blood pressure as often as your doctor tells you to. Check your blood pressure at the same time every day. Take your monitor to your next doctor's appointment. Your doctor will: Make sure you are using it correctly. Make sure it is working right. Understand what your blood pressure numbers should be. Keep all follow-up visits as told by your doctor. This is important. General tips You will need a blood pressure machine, or monitor. Your doctor can suggest a monitor. You can buy one at a drugstore or online. When  choosing one: Choose one with an arm cuff. Choose one that wraps around your upper arm. Only one finger should fit between your arm and the cuff. Do not choose one that measures your blood pressure from your wrist or finger. Where to find more information American Heart Association: www.heart.org Contact a doctor if: Your blood pressure keeps being  high. Your blood pressure is suddenly low. Get help right away if: Your first blood pressure number is higher than 180. Your second blood pressure number is higher than 120. Summary Check your blood pressure at the same time every day. Avoid caffeine, alcohol, smoking, and exercise for 30 minutes before checking your blood pressure. Make sure you understand what your blood pressure numbers should be. This information is not intended to replace advice given to you by your health care provider. Make sure you discuss any questions you have with your health care provider.      Engagement Notes [facility] Chart/ Chart prep: 20 min 10/06/21 CPP Chart Review: 10 min 09/30/21 CPP Office Visit:  23 min on 10/08/21 CPP Office Visit Documentation: 27 min on 10/08/21  Alena Bills Clinical Pharmacist [phone]

## 2021-10-21 ENCOUNTER — Telehealth: Payer: Self-pay | Admitting: Student-PharmD

## 2021-10-21 DIAGNOSIS — I10 Essential (primary) hypertension: Secondary | ICD-10-CM | POA: Diagnosis not present

## 2021-10-21 DIAGNOSIS — J449 Chronic obstructive pulmonary disease, unspecified: Secondary | ICD-10-CM | POA: Diagnosis not present

## 2021-10-21 DIAGNOSIS — E785 Hyperlipidemia, unspecified: Secondary | ICD-10-CM | POA: Diagnosis not present

## 2021-10-21 NOTE — Progress Notes (Signed)
°  Chronic Care Management Pharmacy Assistant   Name: MCCADE SULLENBERGER  MRN: [id number] DOB: Apr 19, 1946   Reason for Encounter: Care Plan and Patient Handout   Medications: Outpatient Encounter Medications as of 10/21/2021  Medication Sig   albuterol (ACCUNEB) 1.25 MG/3ML nebulizer solution Take 3 mLs (1.25 mg total) by nebulization every 6 (six) hours as needed for wheezing.   albuterol (VENTOLIN HFA) 108 (90 Base) MCG/ACT inhaler Inhale 2 puffs into the lungs every 6 (six) hours as needed for wheezing or shortness of breath.   baclofen (LIORESAL) 10 MG tablet    doxycycline (VIBRA-TABS) 100 MG tablet Take 1 tablet (100 mg total) by mouth 2 (two) times daily. With food   EPINEPHrine (EPIPEN 2-PAK) 0.3 mg/0.3 mL IJ SOAJ injection Inject 0.3 mg into the muscle as needed for anaphylaxis.   meloxicam (MOBIC) 15 MG tablet TAKE 1 TABLET BY MOUTH ONCE DAILY WITH FOOD   mupirocin ointment (BACTROBAN) 2 % Apply 1 application topically 2 (two) times daily. to the affected area until resolved   predniSONE (DELTASONE) 10 MG tablet Take one tab 3 x day for 3 days, then take one tab 2 x a day for 3 days and then take one tab a day for 3 days for copd   tamsulosin (FLOMAX) 0.4 MG CAPS capsule Take 1 capsule (0.4 mg total) by mouth daily.   tiZANidine (ZANAFLEX) 4 MG tablet TAKE 1 TABLET BY MOUTH TWICE DAILY AS NEEDED FOR MUSCLE SPASMS   umeclidinium-vilanterol (ANORO ELLIPTA) 62.5-25 MCG/ACT AEPB Inhale 1 puff into the lungs daily.   No facility-administered encounter medications on file as of 10/21/2021.    Reviewed the patients visit reinsured it was completed per the pharmacist Alena Bills request. Printed the CCM care plan. Mailed the patient CCM care plan and patient handout to their most recent address on file.   Time: 3 min  Charlann Lange, [place]  [phone]

## 2021-10-28 ENCOUNTER — Telehealth: Payer: Self-pay | Admitting: Student-PharmD

## 2021-10-28 NOTE — Progress Notes (Addendum)
General Review Call  ? ?IVON, OELKERS [id number] ?30 years, Male  DOB: 1946/04/23  M: [phone] ? ?General Review (HC) ?Completed by Charlann Lange on 10/28/2021 ? ?Chart Review ?What recent interventions/DTPs have been made by any provider to improve the patient's conditions in the last 3 months?: None. ? ?Any recent hospitalizations or ED visits since last visit with CPP?: No ? ?Adherence Review ?Adherence rates for STAR metric medications: None. ? ?Adherence rates for medications indicated for disease state being reviewed: None. ? ?Does the patient have >5 day gap between last estimated fill dates for any of the above medications?: No ? ?Disease State Questions ?Able to connect with the Patient?: Yes ? ?Did patient have any problems with their health recently?: No ? ?Did patient have any problems with their pharmacy?: No ? ?Does patient have any issues or side effects with their medications?: No ? ?Additional  information to pass to Patient's CPP?: No ? ?Anything we can do to help take better care of Patient?: No ? ?Misc. Response/Information:: Patient stated he forgot so he hasn't picked up magnesium but he stated he will when he gets a chance. Patient stated he still has  pain in his legs. ? ?Engagement Notes ?McLemore, Veronica on 10/28/2021 12:13 PM ?[facility] Chart Review: 3 min 10/28/21 ?[facility] Assessment call time spent: 7 min 10/28/21 ?CP Review: 10/28/21 10 min ? ?Charlann Lange, RMA ?Healthcare Concierge  ?[phone] ? ?Pharmacist Review ?Adherence gaps identified?: No ?Drug Therapy Problems identified?: No ?Assessment: Controlled ? ?Alena Bills ?Clinical Pharmacist ? ?

## 2021-10-31 ENCOUNTER — Telehealth: Payer: Self-pay

## 2021-10-31 ENCOUNTER — Other Ambulatory Visit: Payer: Self-pay

## 2021-10-31 MED ORDER — AZITHROMYCIN 250 MG PO TABS: ORAL_TABLET | ORAL | 0 refills | Status: DC

## 2021-10-31 NOTE — Telephone Encounter (Signed)
Per Alyssa it is ok to send med to pharmacy, 5 day zpac. Spoke to pt's wife and informed her med is there if he needs it.  ?

## 2021-11-17 ENCOUNTER — Other Ambulatory Visit: Payer: Self-pay | Admitting: Nurse Practitioner

## 2021-11-24 ENCOUNTER — Telehealth (INDEPENDENT_AMBULATORY_CARE_PROVIDER_SITE_OTHER): Payer: Medicare HMO | Admitting: Nurse Practitioner

## 2021-11-24 ENCOUNTER — Telehealth: Payer: Self-pay

## 2021-11-24 ENCOUNTER — Encounter: Payer: Self-pay | Admitting: Nurse Practitioner

## 2021-11-24 VITALS — Resp 16 | Ht 69.0 in | Wt 150.0 lb

## 2021-11-24 DIAGNOSIS — J449 Chronic obstructive pulmonary disease, unspecified: Secondary | ICD-10-CM

## 2021-11-24 DIAGNOSIS — J069 Acute upper respiratory infection, unspecified: Secondary | ICD-10-CM | POA: Diagnosis not present

## 2021-11-24 MED ORDER — AZITHROMYCIN 250 MG PO TABS: ORAL_TABLET | ORAL | 2 refills | Status: DC

## 2021-11-24 NOTE — Telephone Encounter (Signed)
Work note emailed to patient-Brian Key 

## 2021-11-24 NOTE — Progress Notes (Signed)
[facility] ?[address] ?[place], [place] [address] ? ?Internal MEDICINE  ?Telephone Visit ? ?Patient Name: Brian Key ? [id number]  ?[id number] ? ?Date of Service: 11/24/2021 ? ?I connected with the patient at 11:45 AM by telephone and verified the patients identity using two identifiers.   ?I discussed the limitations, risks, security and privacy concerns of performing an evaluation and management service by telephone and the availability of in person appointments. I also discussed with the patient that there may be a patient responsible charge related to the service.  The patient expressed understanding and agrees to proceed.   ? ?Chief Complaint  ?Patient presents with  ? Cough  ?  Took a Zpak that he had but finished last dose this morning (5 days) Cold and hot sweats.  Felt like fevers.  ? Nausea  ? flu like symptoms  ? Diarrhea  ?  Started thurs and friday  ? Headache  ? ? ?HPI ?Brian Key presents for a telehealth virtual visit for nausea, headache, diarrhea and flu-like symptoms. Symptoms started on Thursday last week. He took a zpack that he had at home but finished it this morning, he is feeling better but not quite 100%. He would like another 5-day zpack and an extra to keep on hand for himself and his wife.  ?Covid negative on test.  ? ? ?Current Medication: ?Outpatient Encounter Medications as of 11/24/2021  ?Medication Sig  ? albuterol (ACCUNEB) 1.25 MG/3ML nebulizer solution Take 3 mLs (1.25 mg total) by nebulization every 6 (six) hours as needed for wheezing.  ? albuterol (VENTOLIN HFA) 108 (90 Base) MCG/ACT inhaler Inhale 2 puffs into the lungs every 6 (six) hours as needed for wheezing or shortness of breath.  ? ANORO ELLIPTA 62.5-25 MCG/ACT AEPB INHALE 1 PUFF BY MOUTH ONCE DAILY.  ? baclofen (LIORESAL) 10 MG tablet   ? EPINEPHrine (EPIPEN 2-PAK) 0.3 mg/0.3 mL IJ SOAJ injection Inject 0.3 mg into the muscle as needed for anaphylaxis.  ? meloxicam (MOBIC) 15 MG tablet TAKE 1 TABLET BY  MOUTH ONCE DAILY WITH FOOD  ? mupirocin ointment (BACTROBAN) 2 % Apply 1 application topically 2 (two) times daily. to the affected area until resolved  ? tamsulosin (FLOMAX) 0.4 MG CAPS capsule Take 1 capsule (0.4 mg total) by mouth daily.  ? tiZANidine (ZANAFLEX) 4 MG tablet TAKE 1 TABLET BY MOUTH TWICE DAILY AS NEEDED FOR MUSCLE SPASMS  ? azithromycin (ZITHROMAX Z-PAK) 250 MG tablet Take 2 tablets by mouth on day one, and 1 tablet by mouth on days two to five  ? [DISCONTINUED] azithromycin (ZITHROMAX Z-PAK) 250 MG tablet Take 2 tablets by mouth on day one, and 1 tablet by mouth on days two to five (Patient not taking: Reported on 11/24/2021)  ? [DISCONTINUED] doxycycline (VIBRA-TABS) 100 MG tablet Take 1 tablet (100 mg total) by mouth 2 (two) times daily. With food (Patient not taking: Reported on 11/24/2021)  ? [DISCONTINUED] predniSONE (DELTASONE) 10 MG tablet Take one tab 3 x day for 3 days, then take one tab 2 x a day for 3 days and then take one tab a day for 3 days for copd (Patient not taking: Reported on 11/24/2021)  ? ?No facility-administered encounter medications on file as of 11/24/2021.  ? ? ?Surgical History: ?Past Surgical History:  ?Procedure Laterality Date  ? ANKLE SURGERY  2009  ? [facility]  ? HERNIA REPAIR  8832  ? umbilical  ? LIPOMA EXCISION  02/16/2017  ? back/  Dr Bary Castilla  ? NECK SURGERY    ? WRIST SURGERY Left 2007  ? ? ?Medical History: ?Past Medical History:  ?Diagnosis Date  ? Back problem   ? COPD (chronic obstructive pulmonary disease) ([place])   ? Diabetes mellitus without complication ([place])   ? Hyperlipidemia   ? ? ?Family History: ?Family History  ?Problem Relation Age of Onset  ? Heart disease Mother   ? COPD Father   ? Asthma Father   ? Prostate cancer Neg Hx   ? Bladder Cancer Neg Hx   ? Kidney cancer Neg Hx   ? ? ?Social History  ? ?Socioeconomic History  ? Marital status: Married  ?  Spouse name: Not on file  ? Number of children: Not on file  ? Years of education: Not on  file  ? Highest education level: Not on file  ?Occupational History  ? Not on file  ?Tobacco Use  ? Smoking status: Every Day  ?  Packs/day: 1.00  ?  Years: 50.00  ?  Pack years: 50.00  ?  Types: Cigarettes  ? Smokeless tobacco: Never  ?Vaping Use  ? Vaping Use: Never used  ?Substance and Sexual Activity  ? Alcohol use: Yes  ?  Comment: occasionally  ? Drug use: Not Currently  ?  Types: Marijuana  ? Sexual activity: Yes  ?  Birth control/protection: None  ?Other Topics Concern  ? Not on file  ?Social History Narrative  ? Not on file  ? ?Social Determinants of Health  ? ?Financial Resource Strain: Not on file  ?Food Insecurity: Not on file  ?Transportation Needs: Not on file  ?Physical Activity: Not on file  ?Stress: Not on file  ?Social Connections: Not on file  ?Intimate Partner Violence: Not on file  ? ? ? ? ?Review of Systems  ?Constitutional:  Positive for chills, fatigue and fever.  ?HENT:  Positive for congestion.   ?Respiratory:  Positive for cough.   ?Cardiovascular: Negative.  Negative for chest pain and palpitations.  ?Gastrointestinal:  Positive for diarrhea and nausea. Negative for vomiting.  ?Neurological:  Positive for headaches.  ? ?Vital Signs: ?Resp 16   Ht '5\' 9"'$  (1.753 m)   Wt 150 lb (68 kg)   BMI 22.15 kg/m?  ? ? ?Observation/Objective: ?He is alert and oriented and engages in conversation appropriately. He does not appear to be in any acute distress over video call ? ? ? ?Assessment/Plan: ?1. Upper respiratory tract infection, unspecified type ?Additional zpack sent to pharmacy with refills.  ?- azithromycin (ZITHROMAX Z-PAK) 250 MG tablet; Take 2 tablets by mouth on day one, and 1 tablet by mouth on days two to five  Dispense: 6 each; Refill: 2 ? ?2. Chronic obstructive pulmonary disease, unspecified COPD type ([place]) ?Prone to severe respiratory infections due to COPD ? ? ?General Counseling: Brian Key understanding of the findings of today's phone visit and agrees with plan of  treatment. I have discussed any further diagnostic evaluation that may be needed or ordered today. We also reviewed his medications today. he has been encouraged to call the office with any questions or concerns that should arise related to todays visit. ? ?Return if symptoms worsen or fail to improve. ? ? ?No orders of the defined types were placed in this encounter. ? ? ?Meds ordered this encounter  ?Medications  ? azithromycin (ZITHROMAX Z-PAK) 250 MG tablet  ?  Sig: Take 2 tablets by mouth on day one, and 1 tablet by mouth on  days two to five  ?  Dispense:  6 each  ?  Refill:  2  ? ? ?Time spent:10 Minutes ?Time spent with patient included reviewing progress notes, labs, imaging studies, and discussing plan for follow up.  ?[place] Controlled Substance Database was reviewed by me for overdose risk score (ORS) if appropriate. ? ?This patient was seen by Brian Osgood, FNP-C in collaboration with Dr. Clayborn Bigness as a part of collaborative care agreement. ? ?Brian Soliday R. Valetta Fuller, MSN, FNP-C ?Internal medicine  ?

## 2021-12-12 ENCOUNTER — Telehealth: Payer: Self-pay

## 2021-12-12 NOTE — Telephone Encounter (Signed)
Pt wife called that pt had head cold and congestion advised him to use OTC flonase  and nebulizer 4 to 6 hrs as needed and he already had refills for zpak at [facility] and if he is not feeling good we can send him for chest xray  ?

## 2021-12-17 ENCOUNTER — Encounter: Payer: Self-pay | Admitting: Nurse Practitioner

## 2021-12-17 ENCOUNTER — Ambulatory Visit (INDEPENDENT_AMBULATORY_CARE_PROVIDER_SITE_OTHER): Payer: Medicare HMO | Admitting: Nurse Practitioner

## 2021-12-17 VITALS — BP 140/71 | HR 68 | Temp 98.7°F | Resp 16 | Ht 68.0 in | Wt 143.2 lb

## 2021-12-17 DIAGNOSIS — T63441A Toxic effect of venom of bees, accidental (unintentional), initial encounter: Secondary | ICD-10-CM | POA: Diagnosis not present

## 2021-12-17 DIAGNOSIS — J209 Acute bronchitis, unspecified: Secondary | ICD-10-CM

## 2021-12-17 DIAGNOSIS — R0689 Other abnormalities of breathing: Secondary | ICD-10-CM

## 2021-12-17 DIAGNOSIS — J449 Chronic obstructive pulmonary disease, unspecified: Secondary | ICD-10-CM | POA: Diagnosis not present

## 2021-12-17 MED ORDER — ANORO ELLIPTA 62.5-25 MCG/ACT IN AEPB: 1.0000 | INHALATION_SPRAY | Freq: Every day | RESPIRATORY_TRACT | 1 refills | Status: DC

## 2021-12-17 MED ORDER — ALBUTEROL SULFATE HFA 108 (90 BASE) MCG/ACT IN AERS: 2.0000 | INHALATION_SPRAY | Freq: Four times a day (QID) | RESPIRATORY_TRACT | 5 refills | Status: DC | PRN

## 2021-12-17 MED ORDER — IPRATROPIUM-ALBUTEROL 0.5-2.5 (3) MG/3ML IN SOLN: 3.0000 mL | RESPIRATORY_TRACT | 5 refills | Status: DC | PRN

## 2021-12-17 MED ORDER — EPINEPHRINE 0.3 MG/0.3ML IJ SOAJ: 0.3000 mg | INTRAMUSCULAR | 2 refills | Status: DC | PRN

## 2021-12-17 NOTE — Progress Notes (Signed)
[facility] [place], [place] [address]  Internal MEDICINE  Office Visit Note  Patient Name: Brian Key  [id number]  [id number]  Date of Service: 12/17/2021  Chief Complaint  Patient presents with   Acute Visit    Possible bug bite, air way started to close up, coworker used inhaler on pt which helped, wants new epi pen, wants rescue inhaler      HPI Brian Key presents for an acute sick visit for recent anaphylactic reaction to a possible bug bite or bee/wasp sting.  This happens at the end of the workday witnessed by his coworkers and he was assisted by his coworkers provided an inhaler to use at the time and had to use one of his EpiPen's.  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  He reports during this episode his airway did start to close up and he was having significant difficulty breathing. He needs medication refills and especially refills on his EpiPen. During this episode once he was stabilized he did not go to the ER but he was driven home by a coworker.    Current Medication:  Outpatient Encounter Medications as of 12/17/2021  Medication Sig   azithromycin (ZITHROMAX Z-PAK) 250 MG tablet Take 2 tablets by mouth on day one, and 1 tablet by mouth on days two to five   baclofen (LIORESAL) 10 MG tablet    ipratropium-albuterol (DUONEB) 0.5-2.5 (3) MG/3ML SOLN Take 3 mLs by nebulization every 4 (four) hours as needed (wheezing/SOB/cough).   meloxicam (MOBIC) 15 MG tablet TAKE 1 TABLET BY MOUTH ONCE DAILY WITH FOOD   mupirocin ointment (BACTROBAN) 2 % Apply 1 application topically 2 (two) times daily. to the affected area until resolved   tamsulosin (FLOMAX) 0.4 MG CAPS capsule Take 1 capsule (0.4 mg total) by mouth daily.   tiZANidine (ZANAFLEX) 4 MG tablet TAKE 1 TABLET BY MOUTH TWICE DAILY AS  NEEDED FOR MUSCLE SPASMS   [DISCONTINUED] albuterol (ACCUNEB) 1.25 MG/3ML nebulizer solution Take 3 mLs (1.25 mg total) by nebulization every 6 (six) hours as needed for wheezing.   [DISCONTINUED] albuterol (VENTOLIN HFA) 108 (90 Base) MCG/ACT inhaler Inhale 2 puffs into the lungs every 6 (six) hours as needed for wheezing or shortness of breath.   [DISCONTINUED] ANORO ELLIPTA 62.5-25 MCG/ACT AEPB INHALE 1 PUFF BY MOUTH ONCE DAILY.   [DISCONTINUED] EPINEPHrine (EPIPEN 2-PAK) 0.3 mg/0.3 mL IJ SOAJ injection Inject 0.3 mg into the muscle as needed for anaphylaxis.   albuterol (VENTOLIN HFA) 108 (90 Base) MCG/ACT inhaler Inhale 2 puffs into the lungs every 6 (six) hours as needed for wheezing or shortness of breath.   EPINEPHrine (EPIPEN 2-PAK) 0.3 mg/0.3 mL IJ SOAJ injection Inject 0.3 mg into the muscle as needed for anaphylaxis.   umeclidinium-vilanterol (ANORO ELLIPTA) 62.5-25 MCG/ACT AEPB Inhale 1 puff into the lungs daily.   No facility-administered encounter medications on file as of 12/17/2021.      Medical History: Past Medical History:  Diagnosis Date   Back problem    COPD (chronic obstructive pulmonary disease) ([facility])    Diabetes mellitus without complication ([facility])    Hyperlipidemia      Vital Signs: BP 140/71   Pulse 68   Temp 98.7 F (37.1 C)   Resp 16   Ht '5\' 8"'$  (1.727  m)   Wt 143 lb 3.2 oz (65 kg)   SpO2 97%   BMI 21.77 kg/m    Review of Systems  Constitutional:  Negative for chills, fatigue and unexpected weight change.  HENT:  Negative for congestion, rhinorrhea, sneezing and sore throat.   Eyes:  Negative for redness.  Respiratory:  Negative for cough, chest tightness and shortness of breath.   Cardiovascular:  Negative for chest pain and palpitations.  Gastrointestinal:  Negative for abdominal pain, constipation, diarrhea, nausea and vomiting.  Genitourinary:  Negative for dysuria and frequency.  Musculoskeletal:  Negative for arthralgias, back pain, joint  swelling and neck pain.  Skin:  Negative for rash.  Neurological: Negative.  Negative for dizziness, tremors, light-headedness, numbness and headaches.  Hematological:  Negative for adenopathy. Does not bruise/bleed easily.  Psychiatric/Behavioral:  Negative for behavioral problems (Depression), sleep disturbance and suicidal ideas. The patient is not nervous/anxious.    Physical Exam Vitals reviewed.  Constitutional:      General: He is not in acute distress.    Appearance: Normal appearance. He is normal weight. He is not ill-appearing.  HENT:     Head: Normocephalic and atraumatic.  Eyes:     Pupils: Pupils are equal, round, and reactive to light.  Cardiovascular:     Rate and Rhythm: Normal rate and regular rhythm.     Heart sounds: Normal heart sounds. No murmur heard. Pulmonary:     Effort: Pulmonary effort is normal. No respiratory distress.     Breath sounds: Normal breath sounds. No wheezing.  Neurological:     Mental Status: He is alert and oriented to person, place, and time.     Cranial Nerves: No cranial nerve deficit.     Coordination: Coordination normal.     Gait: Gait normal.  Psychiatric:        Mood and Affect: Mood normal.        Behavior: Behavior normal.      Assessment/Plan: 1. Chronic obstructive pulmonary disease, unspecified COPD type ([place]) Refills ordered - umeclidinium-vilanterol (ANORO ELLIPTA) 62.5-25 MCG/ACT AEPB; Inhale 1 puff into the lungs daily.  Dispense: 180 each; Refill: 1 - albuterol (VENTOLIN HFA) 108 (90 Base) MCG/ACT inhaler; Inhale 2 puffs into the lungs every 6 (six) hours as needed for wheezing or shortness of breath.  Dispense: 16 each; Refill: 5 - ipratropium-albuterol (DUONEB) 0.5-2.5 (3) MG/3ML SOLN; Take 3 mLs by nebulization every 4 (four) hours as needed (wheezing/SOB/cough).  Dispense: 360 mL; Refill: 5  2. Difficulty breathing EpiPen refills ordered, albuterol nebulizer and rescue inhalers. - EPINEPHrine (EPIPEN 2-PAK)  0.3 mg/0.3 mL IJ SOAJ injection; Inject 0.3 mg into the muscle as needed for anaphylaxis.  Dispense: 2 each; Refill: 2 - albuterol (VENTOLIN HFA) 108 (90 Base) MCG/ACT inhaler; Inhale 2 puffs into the lungs every 6 (six) hours as needed for wheezing or shortness of breath.  Dispense: 16 each; Refill: 5 - ipratropium-albuterol (DUONEB) 0.5-2.5 (3) MG/3ML SOLN; Take 3 mLs by nebulization every 4 (four) hours as needed (wheezing/SOB/cough).  Dispense: 360 mL; Refill: 5  3. 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 - EPINEPHrine (EPIPEN 2-PAK) 0.3 mg/0.3 mL IJ SOAJ injection; Inject 0.3 mg into the muscle as needed for anaphylaxis.  Dispense: 2 each; Refill: 2   General Counseling: Leldon verbalizes understanding of the findings of todays visit and agrees with plan of treatment. I have discussed any further diagnostic evaluation that may be needed or ordered today. We also reviewed  his medications today. he has been encouraged to call the office with any questions or concerns that should arise related to todays visit.    Counseling:    No orders of the defined types were placed in this encounter.   Meds ordered this encounter  Medications   EPINEPHrine (EPIPEN 2-PAK) 0.3 mg/0.3 mL IJ SOAJ injection    Sig: Inject 0.3 mg into the muscle as needed for anaphylaxis.    Dispense:  2 each    Refill:  2    Please make sure he gets 2 pens. He needs one for work and for home.   umeclidinium-vilanterol (ANORO ELLIPTA) 62.5-25 MCG/ACT AEPB    Sig: Inhale 1 puff into the lungs daily.    Dispense:  180 each    Refill:  1   albuterol (VENTOLIN HFA) 108 (90 Base) MCG/ACT inhaler    Sig: Inhale 2 puffs into the lungs every 6 (six) hours as needed for wheezing or shortness of breath.    Dispense:  16 each    Refill:  5   ipratropium-albuterol (DUONEB) 0.5-2.5 (3) MG/3ML SOLN    Sig: Take 3 mLs by nebulization every 4 (four) hours as needed  (wheezing/SOB/cough).    Dispense:  360 mL    Refill:  5    Return if symptoms worsen or fail to improve.  [place] Controlled Substance Database was reviewed by me for overdose risk score (ORS)  Time spent:30 Minutes Time spent with patient included reviewing progress notes, labs, imaging studies, and discussing plan for follow up.   This patient was seen by Jonetta Osgood, FNP-C in collaboration with Dr. Clayborn Bigness as a part of collaborative care agreement.  Gennaro Lizotte R. Valetta Fuller, MSN, FNP-C Internal Medicine

## 2022-01-25 ENCOUNTER — Encounter: Payer: Self-pay | Admitting: Nurse Practitioner

## 2022-02-03 DIAGNOSIS — D171 Benign lipomatous neoplasm of skin and subcutaneous tissue of trunk: Secondary | ICD-10-CM | POA: Diagnosis not present

## 2022-02-04 DIAGNOSIS — M160 Bilateral primary osteoarthritis of hip: Secondary | ICD-10-CM | POA: Insufficient documentation

## 2022-02-04 DIAGNOSIS — N400 Enlarged prostate without lower urinary tract symptoms: Secondary | ICD-10-CM | POA: Insufficient documentation

## 2022-02-06 ENCOUNTER — Ambulatory Visit (INDEPENDENT_AMBULATORY_CARE_PROVIDER_SITE_OTHER): Payer: Medicare HMO | Admitting: Nurse Practitioner

## 2022-02-06 ENCOUNTER — Encounter: Payer: Self-pay | Admitting: Nurse Practitioner

## 2022-02-06 VITALS — BP 140/78 | HR 60 | Temp 98.6°F | Resp 16 | Ht 69.0 in | Wt 146.0 lb

## 2022-02-06 DIAGNOSIS — R7303 Prediabetes: Secondary | ICD-10-CM

## 2022-02-06 DIAGNOSIS — J432 Centrilobular emphysema: Secondary | ICD-10-CM

## 2022-02-06 DIAGNOSIS — M25552 Pain in left hip: Secondary | ICD-10-CM

## 2022-02-06 DIAGNOSIS — J449 Chronic obstructive pulmonary disease, unspecified: Secondary | ICD-10-CM

## 2022-02-06 DIAGNOSIS — M5441 Lumbago with sciatica, right side: Secondary | ICD-10-CM | POA: Diagnosis not present

## 2022-02-06 DIAGNOSIS — F17200 Nicotine dependence, unspecified, uncomplicated: Secondary | ICD-10-CM

## 2022-02-06 DIAGNOSIS — R300 Dysuria: Secondary | ICD-10-CM

## 2022-02-06 DIAGNOSIS — E782 Mixed hyperlipidemia: Secondary | ICD-10-CM

## 2022-02-06 DIAGNOSIS — R69 Illness, unspecified: Secondary | ICD-10-CM | POA: Diagnosis not present

## 2022-02-06 DIAGNOSIS — N400 Enlarged prostate without lower urinary tract symptoms: Secondary | ICD-10-CM | POA: Diagnosis not present

## 2022-02-06 DIAGNOSIS — M5442 Lumbago with sciatica, left side: Secondary | ICD-10-CM

## 2022-02-06 DIAGNOSIS — M25551 Pain in right hip: Secondary | ICD-10-CM | POA: Diagnosis not present

## 2022-02-06 DIAGNOSIS — Z0001 Encounter for general adult medical examination with abnormal findings: Secondary | ICD-10-CM | POA: Diagnosis not present

## 2022-02-06 DIAGNOSIS — D171 Benign lipomatous neoplasm of skin and subcutaneous tissue of trunk: Secondary | ICD-10-CM | POA: Diagnosis not present

## 2022-02-06 DIAGNOSIS — I10 Essential (primary) hypertension: Secondary | ICD-10-CM

## 2022-02-06 DIAGNOSIS — E1165 Type 2 diabetes mellitus with hyperglycemia: Secondary | ICD-10-CM

## 2022-02-06 DIAGNOSIS — G8929 Other chronic pain: Secondary | ICD-10-CM

## 2022-02-06 LAB — POCT GLYCOSYLATED HEMOGLOBIN (HGB A1C): Hemoglobin A1C: 5.4 % (ref 4.0–5.6)

## 2022-02-06 MED ORDER — MELOXICAM 15 MG PO TABS: 15.0000 mg | ORAL_TABLET | Freq: Every day | ORAL | 1 refills | Status: DC

## 2022-02-06 MED ORDER — ANORO ELLIPTA 62.5-25 MCG/ACT IN AEPB: 1.0000 | INHALATION_SPRAY | Freq: Every day | RESPIRATORY_TRACT | 1 refills | Status: DC

## 2022-02-06 NOTE — Progress Notes (Unsigned)
[facility], [place] [address]  Internal MEDICINE  Office Visit Note  Patient Name: Brian Key  [id number]  [id number]  Date of Service: 02/06/2022  Chief Complaint  Patient presents with   Medicare Wellness   Diabetes   COPD   Hyperlipidemia    HPI Brian Key presents for an annual well visit and physical exam.  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  He does have COPD which he uses Anoro Ellipta daily.  Previously he was on Breo but the steroid and the Breo made him feel jittery so last year we switched to Anoro.  He does continue to smoke cigarettes daily but he is down to 1 pack/day or less.  He denies any shortness of breath, chest tightness, wheezing or cough.  He was diagnosed with prediabetes in several years ago and his A1c has stayed consistently at 6.0 or below.  His A1c today is normal at 5.4. -According to his labs last year, his cholesterol levels had improved significantly but the total cholesterol and LDL were still slightly elevated.  Brian Key reports that he is maintained his diet and has not made any significant changes and continues to eat a healthy diet with vegetables and lean proteins.  He also takes a multivitamin and various vitamins and minerals which were discussed today and are appropriate for the patient. -Colorectal cancer screening was discussed today and Brian Key states that he acknowledges the risks and will have a colonoscopy done if he encounters any issues and needs to be addressed but otherwise declines routine colorectal cancer screening. --He does have BPH and takes Flomax, has previously been screened for prostate cancer via labs with PSA level and it has been within normal limits.  No indication to continue screening with PSA level. --His blood pressure was initially elevated but improved when rechecked, see vitals.  His other vital signs are stable and within normal limits.  His weight  and BMI are within normal limits as well.  Although the patient does have hypertension, he is not currently on any antihypertensive medications.  He has tried some of the medications in the past and did not like how the medications made him feel so he stopped taking them.  We have discussed this in detail on multiple visits and it is important for the patient that the medications do not make him feel sick even if he has to take them.  He is working on maintaining his blood pressure levels through minimizing foods and salt in his diet that would elevate his blood pressure, adequate hydration and living a physically active life.  If his blood pressure does become significantly elevated consistently and he does require blood pressure medication, he is willing to try other medications and will stay on 1 as long as he tolerates the side effects. --Brian Key was in the [place] War while he was in the [employer].  He does have significant PTSD and reports that he does have nightmares sometimes.  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  He does not smoke cigarettes while he is working but does like to keep his hands busy when he is not working.  The cigarettes do help with his anxiety level related to his PTSD and we briefly discussed smoking cessation which he is not interested in at this time.  Brian Key states he will let me know if and when  he decides to quit smoking and acknowledges the risks of continuing to use tobacco products.  He does drink alcohol occasionally but not every day and not in excess.  And this was discussed in detail today as well. --He continues to live with his wife Brian Key and reports continuing to have a positive relationship and denies any issues or problems. He has a lipoma on his back that was removed 5 years ago, his general surgeon Dr. Tollie Pizza is going to remove it again because it has come back.  After this,  the patient states that if it comes back again that he probably will just leave it alone. --At a previous office visit he was seen due to severe difficulty breathing and an anaphylactic reaction to a bug bite or sting, he now has EpiPen's wherever he needs them and the nebulizer he is able to use in his truck and he has not had any issues since that office visit.    Current Medication: Outpatient Encounter Medications as of 02/06/2022  Medication Sig   albuterol (VENTOLIN HFA) 108 (90 Base) MCG/ACT inhaler Inhale 2 puffs into the lungs every 6 (six) hours as needed for wheezing or shortness of breath.   baclofen (LIORESAL) 10 MG tablet    EPINEPHrine (EPIPEN 2-PAK) 0.3 mg/0.3 mL IJ SOAJ injection Inject 0.3 mg into the muscle as needed for anaphylaxis.   ipratropium-albuterol (DUONEB) 0.5-2.5 (3) MG/3ML SOLN Take 3 mLs by nebulization every 4 (four) hours as needed (wheezing/SOB/cough).   mupirocin ointment (BACTROBAN) 2 % Apply 1 application topically 2 (two) times daily. to the affected area until resolved   tamsulosin (FLOMAX) 0.4 MG CAPS capsule Take 1 capsule (0.4 mg total) by mouth daily.   tiZANidine (ZANAFLEX) 4 MG tablet TAKE 1 TABLET BY MOUTH TWICE DAILY AS NEEDED FOR MUSCLE SPASMS   [DISCONTINUED] meloxicam (MOBIC) 15 MG tablet TAKE 1 TABLET BY MOUTH ONCE DAILY WITH FOOD   [DISCONTINUED] umeclidinium-vilanterol (ANORO ELLIPTA) 62.5-25 MCG/ACT AEPB Inhale 1 puff into the lungs daily.   meloxicam (MOBIC) 15 MG tablet Take 1 tablet (15 mg total) by mouth daily. with food   umeclidinium-vilanterol (ANORO ELLIPTA) 62.5-25 MCG/ACT AEPB Inhale 1 puff into the lungs daily.   [DISCONTINUED] azithromycin (ZITHROMAX Z-PAK) 250 MG tablet Take 2 tablets by mouth on day one, and 1 tablet by mouth on days two to five (Patient not taking: Reported on 02/06/2022)   No facility-administered encounter medications on file as of 02/06/2022.    Surgical History: Past Surgical History:  Procedure  Laterality Date   ANKLE SURGERY  2009   [place]   HERNIA REPAIR  1287   umbilical   LIPOMA EXCISION  02/16/2017   back/ Dr Bary Castilla   NECK SURGERY     WRIST SURGERY Left 2007    Medical History: Past Medical History:  Diagnosis Date   Back problem    COPD (chronic obstructive pulmonary disease) ([place])    Diabetes mellitus without complication ([place])    Hyperlipidemia     Family History: Family History  Problem Relation Age of Onset   Heart disease Mother    COPD Father    Asthma Father    Prostate cancer Neg Hx    Bladder Cancer Neg Hx    Kidney cancer Neg Hx     Social History   Socioeconomic History   Marital status: Married    Spouse name: Not on file   Number of children: Not on file   Years of  education: Not on file   Highest education level: Not on file  Occupational History   Not on file  Tobacco Use   Smoking status: Every Day    Packs/day: 1.00    Years: 50.00    Total pack years: 50.00    Types: Cigarettes   Smokeless tobacco: Never  Vaping Use   Vaping Use: Never used  Substance and Sexual Activity   Alcohol use: Yes    Comment: occasionally   Drug use: Not Currently    Types: Marijuana   Sexual activity: Yes    Birth control/protection: None  Other Topics Concern   Not on file  Social History Narrative   Not on file   Social Determinants of Health   Financial Resource Strain: Not on file  Food Insecurity: Not on file  Transportation Needs: Not on file  Physical Activity: Not on file  Stress: Not on file  Social Connections: Not on file  Intimate Partner Violence: Not on file      Review of Systems  Constitutional:  Negative for activity change, appetite change, chills, fatigue, fever and unexpected weight change.  HENT: Negative.  Negative for congestion, ear pain, rhinorrhea, sore throat and trouble swallowing.   Eyes: Negative.   Respiratory: Negative.  Negative for cough, chest tightness, shortness of breath  and wheezing.   Cardiovascular: Negative.  Negative for chest pain.  Gastrointestinal: Negative.  Negative for abdominal pain, blood in stool, constipation, diarrhea, nausea and vomiting.  Endocrine: Negative.   Genitourinary: Negative.  Negative for difficulty urinating, dysuria, frequency, hematuria and urgency.  Musculoskeletal: Negative.  Negative for arthralgias, back pain, joint swelling, myalgias and neck pain.  Skin: Negative.  Negative for rash and wound.  Allergic/Immunologic: Negative.  Negative for immunocompromised state.  Neurological: Negative.  Negative for dizziness, seizures, numbness and headaches.  Hematological: Negative.   Psychiatric/Behavioral: Negative.  Negative for behavioral problems, self-injury and suicidal ideas. The patient is not nervous/anxious.     Vital Signs: BP (!) 160/78   Pulse (!) 56   Temp 98.6 F (37 C)   Resp 16   Ht '5\' 9"'$  (1.753 m)   Wt 146 lb (66.2 kg)   SpO2 98%   BMI 21.56 kg/m    Physical Exam Vitals reviewed.  Constitutional:      General: He is awake. He is not in acute distress.    Appearance: Normal appearance. He is well-developed, well-groomed and normal weight. He is not ill-appearing or diaphoretic.  HENT:     Head: Normocephalic and atraumatic.     Right Ear: Tympanic membrane, ear canal and external ear normal. There is no impacted cerumen.     Left Ear: Tympanic membrane, ear canal and external ear normal. There is no impacted cerumen.     Nose: Nose normal. No congestion or rhinorrhea.     Mouth/Throat:     Lips: Pink.     Mouth: Mucous membranes are moist.     Pharynx: Oropharynx is clear. Uvula midline. No oropharyngeal exudate or posterior oropharyngeal erythema.  Eyes:     General: Lids are normal. Vision grossly intact. Gaze aligned appropriately. No scleral icterus.       Right eye: No discharge.        Left eye: No discharge.     Extraocular Movements: Extraocular movements intact.     Conjunctiva/sclera:  Conjunctivae normal.     Pupils: Pupils are equal, round, and reactive to light.     Funduscopic exam:  Right eye: Red reflex present.        Left eye: Red reflex present. Neck:     Thyroid: No thyromegaly.     Vascular: No JVD.     Trachea: Trachea and phonation normal. No tracheal deviation.  Cardiovascular:     Rate and Rhythm: Normal rate and regular rhythm.     Pulses: Normal pulses.     Heart sounds: Normal heart sounds, S1 normal and S2 normal. No murmur heard.    No friction rub. No gallop.  Pulmonary:     Effort: Pulmonary effort is normal. No accessory muscle usage or respiratory distress.     Breath sounds: Normal breath sounds and air entry. No stridor. No decreased breath sounds, wheezing or rales.  Chest:     Chest wall: No tenderness.  Abdominal:     General: Bowel sounds are normal. There is no distension.     Palpations: Abdomen is soft. There is no shifting dullness, fluid wave, mass or pulsatile mass.     Tenderness: There is no abdominal tenderness. There is no guarding or rebound.     Hernia: A hernia is present. Hernia is present in the ventral area (not observed today but reported by patient).  Musculoskeletal:        General: No tenderness or deformity. Normal range of motion.     Cervical back: Normal range of motion and neck supple.     Right lower leg: No edema.     Left lower leg: No edema.  Lymphadenopathy:     Cervical: No cervical adenopathy.  Skin:    General: Skin is warm and dry.     Capillary Refill: Capillary refill takes less than 2 seconds.     Coloration: Skin is not pale.     Findings: No erythema or rash.  Neurological:     Mental Status: He is alert and oriented to person, place, and time.     Cranial Nerves: No cranial nerve deficit.     Motor: No abnormal muscle tone.     Coordination: Coordination normal.     Gait: Gait normal.     Deep Tendon Reflexes: Reflexes are normal and symmetric.  Psychiatric:        Mood and Affect:  Mood normal.        Behavior: Behavior normal. Behavior is cooperative.        Thought Content: Thought content normal.        Judgment: Judgment normal.        Assessment/Plan: 1. Encounter for general adult medical examination with abnormal findings Age-appropriate preventive screenings and vaccinations discussed, annual physical exam completed. Routine labs for health maintenance declined for now, will discuss repeating labs at next office visit.  Prostate cancer screening not indicated.  Routine colorectal cancer screening declined. PHM updated.   2. Centrilobular emphysema (HCC) No significant change in respiratory status, continues to use Anoro Ellipta inhaler daily and has rescue inhaler, nebulizer treatments as needed - umeclidinium-vilanterol (ANORO ELLIPTA) 62.5-25 MCG/ACT AEPB; Inhale 1 puff into the lungs daily.  Dispense: 180 each; Refill: 1  3. Prediabetes A1c is within normal limits at 5.4, patient has had no significant change in his diet or physical activity in the past year, will repeat A1c in 6 months - POCT HgB A1C  4. Essential hypertension Blood pressure is stable without medication, will continue to monitor periodically at office visits, follow-up in 6 months  5. Mixed hyperlipidemia As of his last labs in mid  2022, his cholesterol levels had significantly improved and he continues to monitor his diet and physical activity.  We will discuss repeating cholesterol levels at his next office visit in 6 months  6. Benign prostatic hyperplasia without lower urinary tract symptoms Patient has not had any issues recently, continues to take Flomax daily  7. Chronic pain of both hips Has chronic pain, takes meloxicam daily, also has tizanidine and baclofen if he needs it. - meloxicam (MOBIC) 15 MG tablet; Take 1 tablet (15 mg total) by mouth daily. 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 with food  Dispense: 90 tablet; Refill: 1  9. Lipoma of back will be having surgery to have lipoma removed again, will not have the surgery again after this if the lipoma comes back a third time  10. Dysuria Routine urinalysis done - UA/M w/rflx Culture, Routine - Microscopic Examination  11. Nicotine dependence with current use Continues to smoke 1 pack/day or less, not interested in smoking cessation counseling at this time, acknowledges the risks and possible complications of continued smoking and tobacco use.  Will notify the clinic if he decides to quit smoking and/or would like assistance with smoking cessation      General Counseling: Brian Needle understanding of the findings of todays visit and agrees with plan of treatment. I have discussed any further diagnostic evaluation that may be needed or ordered today. We also reviewed his medications today. he has been encouraged to call the office with any questions or concerns that should arise related to todays visit.    Orders Placed This Encounter  Procedures   Microscopic Examination   UA/M w/rflx Culture, Routine   POCT HgB A1C    Meds ordered this encounter  Medications   meloxicam (MOBIC) 15 MG tablet    Sig: Take 1 tablet (15 mg total) by mouth daily. with food    Dispense:  90 tablet    Refill:  1    For future refills   umeclidinium-vilanterol (ANORO ELLIPTA) 62.5-25 MCG/ACT AEPB    Sig: Inhale 1 puff into the lungs daily.    Dispense:  180 each    Refill:  1    For future refills    Return in about 6 months (around 08/08/2022) for F/U, med refill, Brian Key PCP.   Total time spent:30 Minutes Time spent includes review of chart, medications, test results, and follow up plan with the patient.    Controlled Substance Database was reviewed by me.  This patient was seen by Jonetta Osgood, FNP-C in collaboration with Dr. Clayborn Bigness as a part of collaborative care agreement.  Abhijay Morriss R. Valetta Fuller, MSN, FNP-C Internal medicine

## 2022-02-07 ENCOUNTER — Encounter: Payer: Self-pay | Admitting: Nurse Practitioner

## 2022-02-07 DIAGNOSIS — R7303 Prediabetes: Secondary | ICD-10-CM | POA: Insufficient documentation

## 2022-02-07 LAB — UA/M W/RFLX CULTURE, ROUTINE
Bilirubin, UA: NEGATIVE
Glucose, UA: NEGATIVE
Leukocytes,UA: NEGATIVE
Nitrite, UA: NEGATIVE
Protein,UA: NEGATIVE
RBC, UA: NEGATIVE
Specific Gravity, UA: 1.023 (ref 1.005–1.030)
Urobilinogen, Ur: 0.2 mg/dL (ref 0.2–1.0)
pH, UA: 5 (ref 5.0–7.5)

## 2022-02-07 LAB — MICROSCOPIC EXAMINATION
Bacteria, UA: NONE SEEN
Casts: NONE SEEN /lpf
Epithelial Cells (non renal): NONE SEEN /hpf (ref 0–10)
RBC, Urine: NONE SEEN /hpf (ref 0–2)
WBC, UA: NONE SEEN /hpf (ref 0–5)

## 2022-02-18 ENCOUNTER — Ambulatory Visit: Payer: Medicare HMO | Admitting: Urology

## 2022-02-19 ENCOUNTER — Ambulatory Visit: Payer: Medicare HMO | Admitting: Urology

## 2022-02-19 ENCOUNTER — Ambulatory Visit: Payer: Self-pay | Admitting: Urology

## 2022-02-19 ENCOUNTER — Other Ambulatory Visit: Payer: Self-pay | Admitting: General Surgery

## 2022-02-19 DIAGNOSIS — D492 Neoplasm of unspecified behavior of bone, soft tissue, and skin: Secondary | ICD-10-CM | POA: Diagnosis not present

## 2022-02-23 ENCOUNTER — Ambulatory Visit: Payer: Medicare HMO | Admitting: Urology

## 2022-02-23 LAB — SURGICAL PATHOLOGY

## 2022-02-28 ENCOUNTER — Other Ambulatory Visit: Payer: Self-pay | Admitting: Urology

## 2022-02-28 DIAGNOSIS — N400 Enlarged prostate without lower urinary tract symptoms: Secondary | ICD-10-CM

## 2022-03-19 ENCOUNTER — Encounter: Payer: Self-pay | Admitting: Urology

## 2022-03-19 ENCOUNTER — Ambulatory Visit: Payer: Medicare HMO | Admitting: Urology

## 2022-03-19 VITALS — BP 140/80 | HR 60 | Ht 69.0 in | Wt 145.0 lb

## 2022-03-19 DIAGNOSIS — R399 Unspecified symptoms and signs involving the genitourinary system: Secondary | ICD-10-CM

## 2022-03-19 DIAGNOSIS — N401 Enlarged prostate with lower urinary tract symptoms: Secondary | ICD-10-CM

## 2022-03-19 DIAGNOSIS — H524 Presbyopia: Secondary | ICD-10-CM | POA: Diagnosis not present

## 2022-03-19 DIAGNOSIS — N400 Enlarged prostate without lower urinary tract symptoms: Secondary | ICD-10-CM

## 2022-03-19 LAB — BLADDER SCAN AMB NON-IMAGING

## 2022-03-19 NOTE — Progress Notes (Signed)
   03/19/2022 9:08 AM   Brian Key [birth year] [id number]  Reason for visit: Follow up BPH   HPI: I saw Brian Key in urology clinic today in follow-up for urinary symptoms.  He was previously followed by Dr. Pilar Jarvis.  He is a 76 year old male whose been followed long-term for BPH and intermittent urinary symptoms.  He has previously had some burning with urination after a bout of prostatitis, however follow-up cystoscopy with Dr. Pilar Jarvis was normal.  He has been on Flomax 0.4 mg nightly long-term which he feels significantly improved his symptoms.  He really denies any complaints about his urination today and is overall doing very well.  He denies any UTIs, retention, or gross hematuria since our last visit.     Last PSA in June 2020 was normal and stable at 1.1, and PSA screening was discontinued per the AUA guidelines using shared decision making.  Prostate measures 58 g on CT in 2016.   He would like to continue Flomax for his stable BPH.  Return precautions discussed including gross hematuria or worsening urinary symptoms.  I also offered that he could follow-up with his PCP to have his Flomax refilled, but he would like to continue yearly urology follow-up.   Continue Flomax for BPH symptoms RTC 1 year PVR  Brian Co, MD  [facility] [address] [place], [place] [address] [phone]

## 2022-04-06 ENCOUNTER — Other Ambulatory Visit: Payer: Self-pay

## 2022-04-06 DIAGNOSIS — T63441A Toxic effect of venom of bees, accidental (unintentional), initial encounter: Secondary | ICD-10-CM

## 2022-04-06 DIAGNOSIS — R0689 Other abnormalities of breathing: Secondary | ICD-10-CM

## 2022-04-06 MED ORDER — EPINEPHRINE 0.3 MG/0.3ML IJ SOAJ: 0.3000 mg | INTRAMUSCULAR | 2 refills | Status: DC | PRN

## 2022-04-19 ENCOUNTER — Emergency Department: Payer: Medicare HMO

## 2022-04-19 ENCOUNTER — Encounter: Payer: Self-pay | Admitting: Internal Medicine

## 2022-04-19 ENCOUNTER — Other Ambulatory Visit: Payer: Self-pay

## 2022-04-19 ENCOUNTER — Observation Stay
Admission: EM | Admit: 2022-04-19 | Discharge: 2022-04-20 | Disposition: A | Discharge status: Home / Self Care | Payer: Medicare HMO | Attending: Internal Medicine | Admitting: Internal Medicine

## 2022-04-19 DIAGNOSIS — D72829 Elevated white blood cell count, unspecified: Secondary | ICD-10-CM | POA: Diagnosis not present

## 2022-04-19 DIAGNOSIS — N400 Enlarged prostate without lower urinary tract symptoms: Secondary | ICD-10-CM | POA: Diagnosis not present

## 2022-04-19 DIAGNOSIS — R000 Tachycardia, unspecified: Secondary | ICD-10-CM | POA: Diagnosis not present

## 2022-04-19 DIAGNOSIS — T782XXA Anaphylactic shock, unspecified, initial encounter: Principal | ICD-10-CM

## 2022-04-19 DIAGNOSIS — R0602 Shortness of breath: Secondary | ICD-10-CM | POA: Insufficient documentation

## 2022-04-19 DIAGNOSIS — I959 Hypotension, unspecified: Secondary | ICD-10-CM | POA: Diagnosis not present

## 2022-04-19 DIAGNOSIS — R609 Edema, unspecified: Secondary | ICD-10-CM | POA: Diagnosis not present

## 2022-04-19 DIAGNOSIS — G629 Polyneuropathy, unspecified: Secondary | ICD-10-CM | POA: Diagnosis not present

## 2022-04-19 DIAGNOSIS — E876 Hypokalemia: Secondary | ICD-10-CM | POA: Diagnosis present

## 2022-04-19 DIAGNOSIS — I4891 Unspecified atrial fibrillation: Secondary | ICD-10-CM | POA: Diagnosis not present

## 2022-04-19 DIAGNOSIS — J449 Chronic obstructive pulmonary disease, unspecified: Secondary | ICD-10-CM | POA: Diagnosis not present

## 2022-04-19 DIAGNOSIS — T63441A Toxic effect of venom of bees, accidental (unintentional), initial encounter: Principal | ICD-10-CM | POA: Diagnosis present

## 2022-04-19 DIAGNOSIS — Z743 Need for continuous supervision: Secondary | ICD-10-CM | POA: Diagnosis not present

## 2022-04-19 DIAGNOSIS — Z91030 Bee allergy status: Secondary | ICD-10-CM | POA: Insufficient documentation

## 2022-04-19 DIAGNOSIS — R7989 Other specified abnormal findings of blood chemistry: Secondary | ICD-10-CM | POA: Diagnosis present

## 2022-04-19 DIAGNOSIS — R0689 Other abnormalities of breathing: Secondary | ICD-10-CM | POA: Diagnosis not present

## 2022-04-19 LAB — CBC WITH DIFFERENTIAL/PLATELET
Abs Immature Granulocytes: 0.03 10*3/uL (ref 0.00–0.07)
Basophils Absolute: 0 10*3/uL (ref 0.0–0.1)
Basophils Relative: 0 %
Eosinophils Absolute: 0.2 10*3/uL (ref 0.0–0.5)
Eosinophils Relative: 2 %
HCT: 51.8 % (ref 39.0–52.0)
Hemoglobin: 17.3 g/dL — ABNORMAL HIGH (ref 13.0–17.0)
Immature Granulocytes: 0 %
Lymphocytes Relative: 56 %
Lymphs Abs: 6.4 10*3/uL — ABNORMAL HIGH (ref 0.7–4.0)
MCH: 31.7 pg (ref 26.0–34.0)
MCHC: 33.4 g/dL (ref 30.0–36.0)
MCV: 95 fL (ref 80.0–100.0)
Monocytes Absolute: 0.5 10*3/uL (ref 0.1–1.0)
Monocytes Relative: 5 %
Neutro Abs: 4.3 10*3/uL (ref 1.7–7.7)
Neutrophils Relative %: 37 %
Platelets: 178 10*3/uL (ref 150–400)
RBC: 5.45 MIL/uL (ref 4.22–5.81)
RDW: 13.7 % (ref 11.5–15.5)
Smear Review: NORMAL
WBC: 11.4 10*3/uL — ABNORMAL HIGH (ref 4.0–10.5)
nRBC: 0 % (ref 0.0–0.2)

## 2022-04-19 LAB — MRSA NEXT GEN BY PCR, NASAL: MRSA by PCR Next Gen: NOT DETECTED

## 2022-04-19 LAB — BASIC METABOLIC PANEL
Anion gap: 10 (ref 5–15)
BUN: 21 mg/dL (ref 8–23)
CO2: 21 mmol/L — ABNORMAL LOW (ref 22–32)
Calcium: 7.8 mg/dL — ABNORMAL LOW (ref 8.9–10.3)
Chloride: 105 mmol/L (ref 98–111)
Creatinine, Ser: 0.89 mg/dL (ref 0.61–1.24)
GFR, Estimated: >60 mL/min (ref >60)
Glucose, Bld: 178 mg/dL — ABNORMAL HIGH (ref 70–99)
Potassium: 3.1 mmol/L — ABNORMAL LOW (ref 3.5–5.1)
Sodium: 136 mmol/L (ref 135–145)

## 2022-04-19 LAB — HEPATIC FUNCTION PANEL
ALT: 11 U/L (ref 0–44)
AST: 19 U/L (ref 15–41)
Albumin: 3.6 g/dL (ref 3.5–5.0)
Alkaline Phosphatase: 71 U/L (ref 38–126)
Bilirubin, Direct: <0.1 mg/dL (ref 0.0–0.2)
Total Bilirubin: 1 mg/dL (ref 0.3–1.2)
Total Protein: 6 g/dL — ABNORMAL LOW (ref 6.5–8.1)

## 2022-04-19 LAB — URINALYSIS, ROUTINE W REFLEX MICROSCOPIC
Bilirubin Urine: NEGATIVE
Glucose, UA: 50 mg/dL — AB
Hgb urine dipstick: NEGATIVE
Ketones, ur: NEGATIVE mg/dL
Leukocytes,Ua: NEGATIVE
Nitrite: NEGATIVE
Protein, ur: NEGATIVE mg/dL
Specific Gravity, Urine: 1.011 (ref 1.005–1.030)
pH: 5 (ref 5.0–8.0)

## 2022-04-19 LAB — MAGNESIUM: Magnesium: 2 mg/dL (ref 1.7–2.4)

## 2022-04-19 LAB — TSH: TSH: 1.565 u[IU]/mL (ref 0.350–4.500)

## 2022-04-19 LAB — BRAIN NATRIURETIC PEPTIDE: B Natriuretic Peptide: 174.4 pg/mL — ABNORMAL HIGH (ref 0.0–100.0)

## 2022-04-19 LAB — GLUCOSE, CAPILLARY
Glucose-Capillary: 208 mg/dL — ABNORMAL HIGH (ref 70–99)
Glucose-Capillary: 190 mg/dL — ABNORMAL HIGH (ref 70–99)
Glucose-Capillary: 260 mg/dL — ABNORMAL HIGH (ref 70–99)

## 2022-04-19 LAB — TROPONIN I (HIGH SENSITIVITY): Troponin I (High Sensitivity): 8 ng/L (ref <18)

## 2022-04-19 MED ORDER — DILTIAZEM HCL 25 MG/5ML IV SOLN
20.0000 mg | Freq: Once | INTRAVENOUS | Status: AC
  Administered 2022-04-19: 20 mg via INTRAVENOUS

## 2022-04-19 MED ORDER — INSULIN ASPART 100 UNIT/ML IJ SOLN: 0.0000 [IU] | Freq: Three times a day (TID) | INTRAMUSCULAR | Status: DC

## 2022-04-19 MED ORDER — ACETAMINOPHEN 650 MG RE SUPP: 650.0000 mg | Freq: Four times a day (QID) | RECTAL | Status: DC | PRN

## 2022-04-19 MED ORDER — DILTIAZEM HCL-DEXTROSE 125-5 MG/125ML-% IV SOLN (PREMIX)
5.0000 mg/h | INTRAVENOUS | Status: DC
  Administered 2022-04-19: 5 mg/h via INTRAVENOUS
  Administered 2022-04-19: 15 mg/h via INTRAVENOUS
  Filled 2022-04-19 (×2): qty 125

## 2022-04-19 MED ORDER — DIPHENHYDRAMINE HCL 50 MG/ML IJ SOLN: 50.0000 mg | Freq: Four times a day (QID) | INTRAMUSCULAR | Status: DC | PRN

## 2022-04-19 MED ORDER — POTASSIUM CHLORIDE CRYS ER 20 MEQ PO TBCR
40.0000 meq | EXTENDED_RELEASE_TABLET | Freq: Once | ORAL | Status: AC
  Administered 2022-04-19: 40 meq via ORAL
  Filled 2022-04-19: qty 2

## 2022-04-19 MED ORDER — UMECLIDINIUM-VILANTEROL 62.5-25 MCG/ACT IN AEPB
1.0000 | INHALATION_SPRAY | Freq: Every day | RESPIRATORY_TRACT | Status: DC
  Administered 2022-04-20: 1 via RESPIRATORY_TRACT
  Filled 2022-04-19: qty 14

## 2022-04-19 MED ORDER — TAMSULOSIN HCL 0.4 MG PO CAPS
0.4000 mg | ORAL_CAPSULE | Freq: Every day | ORAL | Status: DC
  Administered 2022-04-20: 0.4 mg via ORAL
  Filled 2022-04-19: qty 1

## 2022-04-19 MED ORDER — ACETAMINOPHEN 325 MG PO TABS: 650.0000 mg | ORAL_TABLET | Freq: Four times a day (QID) | ORAL | Status: DC | PRN

## 2022-04-19 MED ORDER — ONDANSETRON HCL 4 MG/2ML IJ SOLN: 4.0000 mg | Freq: Four times a day (QID) | INTRAMUSCULAR | Status: DC | PRN

## 2022-04-19 MED ORDER — DILTIAZEM HCL 25 MG/5ML IV SOLN
10.0000 mg | Freq: Once | INTRAVENOUS | Status: AC
  Administered 2022-04-19: 10 mg via INTRAVENOUS
  Filled 2022-04-19: qty 5

## 2022-04-19 MED ORDER — ONDANSETRON HCL 4 MG PO TABS
4.0000 mg | ORAL_TABLET | Freq: Four times a day (QID) | ORAL | Status: DC | PRN
Start: 2022-04-19 — End: 2022-04-20

## 2022-04-19 MED ORDER — ENOXAPARIN SODIUM 40 MG/0.4ML IJ SOSY
40.0000 mg | PREFILLED_SYRINGE | Freq: Every day | INTRAMUSCULAR | Status: DC
  Administered 2022-04-19: 40 mg via SUBCUTANEOUS
  Filled 2022-04-19: qty 0.4

## 2022-04-19 MED ORDER — INSULIN ASPART 100 UNIT/ML IJ SOLN: 0.0000 [IU] | Freq: Every day | INTRAMUSCULAR | Status: DC

## 2022-04-19 MED ORDER — CHLORHEXIDINE GLUCONATE CLOTH 2 % EX PADS: 6.0000 | MEDICATED_PAD | Freq: Every day | CUTANEOUS | Status: DC

## 2022-04-19 MED ORDER — ONDANSETRON HCL 4 MG PO TABS: 4.0000 mg | ORAL_TABLET | Freq: Four times a day (QID) | ORAL | Status: DC | PRN

## 2022-04-19 MED ORDER — IPRATROPIUM-ALBUTEROL 0.5-2.5 (3) MG/3ML IN SOLN: 3.0000 mL | Freq: Four times a day (QID) | RESPIRATORY_TRACT | Status: DC | PRN

## 2022-04-19 NOTE — Progress Notes (Signed)
Pt A&OX4, VSS but still in a-fib with rate 120-140 on the monitor. Cardizem gtt running. Denies SOB. Sating 96% on 2 L [place]. Denies pain. Pt is modest and refuses to pull off underwear in assessment. Pt wishes to keep his wallet on his bedside table. Refuses SSI and flowmax. MD made aware.

## 2022-04-19 NOTE — Assessment & Plan Note (Addendum)
-   Mild, suspect reactive secondary to anaphylactic/allergic reaction - CBC in the a.m.

## 2022-04-19 NOTE — Assessment & Plan Note (Signed)
-   Presumed secondary to increase heart rate in setting of A-fib with RVR - Potassium chloride 40 mEq p.o. one-time dose ordered - BMP in the a.m.

## 2022-04-19 NOTE — Assessment & Plan Note (Addendum)
-   Patient is status post Solu-Medrol 125 mg IV, Pepcid 20 mg IV, Benadryl 50 mg IV, 2 doses of epinephrine, and DuoNebs x3 per EMS - Benadryl 50 mg IV every 6 hours as needed for allergies and itching, 4 doses ordered

## 2022-04-19 NOTE — Assessment & Plan Note (Signed)
-   Presumed secondary to A-fib with RVR in setting of multiple epinephrine injections

## 2022-04-19 NOTE — Hospital Course (Addendum)
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  Initial vitals in the emergency department show temperature of 97.8, respiration rate 24, blood pressure was 164/72, heart rate was 147, SPO2 was 90% on 2 L nasal cannula.  Serum sodium is 136, potassium 3.1, chloride 105, bicarb 21, BUN of 21, serum creatinine of 0.89, GFR greater than 60, nonfasting blood glucose 178, WBC 11.4, hemoglobin 17.3, platelets of 178.  BNP is 174.4.  High sensitivity troponin was 8.  Magnesium level was 2.0.  He gave himself an IM dose of Epi.  Per report EMS gave patient: 2 additional doses of epinephrine IV, Solu-Medrol 125 mg IV one-time dose, Pepcid 20 mg IV, Benadryl 50 mg IV,  DuoNeb's x3.  ED treatment: Cardizem 10 mg IV one-time dose, Cardizem 20 mg IV one-time dose, diltiazem gtt.  Patient was found to be in AF with RVR, presumed secondary to 3 doses of epinephrine.  He was started on Cardizem infusion.  8/28: Patient reverted back to sinus rhythm and remained in sinus rhythm.  Cardizem was stopped last night. Patient thinks that he is at his baseline and requesting discharge. Repeat EKG with normal sinus rhythm and no other significant abnormality.  He was also requesting a work note, apparently still working at this age which was provided.  Patient will continue on his current medications and follow-up with his providers for further recommendations.

## 2022-04-19 NOTE — ED Notes (Signed)
Report given to ICU  Raquel Sarna, RN

## 2022-04-19 NOTE — ED Notes (Signed)
Update given to pt wife

## 2022-04-19 NOTE — Assessment & Plan Note (Signed)
-   Tamsulosin 0.4 mg p.o. daily resumed

## 2022-04-19 NOTE — ED Provider Notes (Signed)
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 male  with COPD not on oxygen who comes in with concern for allergic reaction.  Patient reports working outside being stung by 2 bees.  He reports severe history of anaphylaxis requiring EpiPen's but never needed more than 1.  Denies any prior history of intubation.  Patient was having significant respiratory issues therefore patient was given 2 additional days with EMS, Solu-Medrol, Pepcid, 50 of IV Benadryl.  Patient was also given 3 doses of albuterol as well.  Patient reports symptoms are much better at this time.   Physical Exam   Triage Vital Signs: ED Triage Vitals  Enc Vitals Group     BP 04/19/22 0959 (!) 148/93     Pulse Rate 04/19/22 0959 (!) 160     Resp 04/19/22 0959 (!) 23     Temp 04/19/22 0959 97.8 F (36.6 C)     Temp Source 04/19/22 0959 Oral     SpO2 04/19/22 0959 100 %     Weight --      Height --      Head Circumference --      Peak Flow --      Pain Score 04/19/22 0955 0     Pain Loc --      Pain Edu? --      Excl. in [place]? --     Most recent vital signs: Vitals:   04/19/22 0959  BP: (!) 148/93  Pulse: (!) 160  Resp: (!) 23  Temp: 97.8 F (36.6 C)  SpO2: 100%     General: Awake, no distress.  CV:  Good peripheral perfusion.  Tachycardia Resp:  Normal effort.  Abd:  No distention.  Other:  No swelling the legs.  No obvious swelling the face.  Oral pharynx is clear.  Patient is currently getting albuterol treatments from EMS without any  wheezing noted.  He does not.  To be in any respiratory distress   ED Results / Procedures / Treatments   Labs (all labs ordered are listed, but only abnormal results are displayed) Labs Reviewed  CBC WITH DIFFERENTIAL/PLATELET  BASIC METABOLIC PANEL  TROPONIN I (HIGH SENSITIVITY)     EKG  My interpretation of EKG:  Patient is in A-fib with RVR with a rate of 161 without any ST elevation or T wave inversions, normal intervals  RADIOLOGY I have reviewed the xray personally and interpreted no evidence of pneumonia   PROCEDURES:  Critical Care performed: Yes, see critical care procedure note(s)  .1-3 Lead EKG Interpretation  Performed by: Vanessa [place], MD Authorized by: Vanessa Shirley, MD     Interpretation: abnormal     ECG rate:  160   ECG rate assessment: tachycardic     Rhythm: atrial fibrillation     Ectopy: none     Conduction: normal   .Critical Care  Performed by: Vanessa Levy, MD Authorized  by: Vanessa [place], MD   Critical care provider statement:    Critical care time (minutes):  30   Critical care was necessary to treat or prevent imminent or life-threatening deterioration of the following conditions:  Cardiac failure   Critical care was time spent personally by me on the following activities:  Development of treatment plan with patient or surrogate, discussions with consultants, evaluation of patient's response to treatment, examination of patient, ordering and review of laboratory studies, ordering and review of radiographic studies, ordering and performing treatments and interventions, pulse oximetry, re-evaluation of patient's condition and review of old charts    MEDICATIONS ORDERED IN ED: Medications  diltiazem (CARDIZEM) 125 mg in dextrose 5% 125 mL (1 mg/mL) infusion (10 mg/hr Intravenous Rate/Dose Change 04/19/22 1242)  diltiazem (CARDIZEM) injection 10 mg (10 mg Intravenous Given 04/19/22 1029)  diltiazem (CARDIZEM) injection 20 mg (20 mg  Intravenous Given 04/19/22 1141)     IMPRESSION / MDM / [place] / ED COURSE  I reviewed the triage vital signs and the nursing notes.   Patient's presentation is most consistent with acute presentation with potential threat to life or bodily function.   Differential includes anaphylaxis but patient also noted to be in A-fib with RVR.  Suspect this is related to all the epinephrine.  We will get xray labs to evaluate for PNA/edema/ACS  CBC shows slightly elevated white count.  Troponin is negative.  BMP reassuring BNP slightly elevated.  Patient is unsure of any prior history of atrial fibrillation given patient's age I feel he is high risk for if we try to cardiovert him especially with all the epinephrine and albuterol on board I suspect that this is most likely contributing to the elevated heart rate.  Patient was given a dose of IV diltiazem and started on a dill drip after a second push of Dilt.  Heart rates have come down to 140.  Discussed the hospital team for admission given continued elevated heart rates but suspect patient just needs more time for the epinephrine to washout  The patient is on the cardiac monitor to evaluate for evidence of arrhythmia and/or significant heart rate changes.      FINAL CLINICAL IMPRESSION(S) / ED DIAGNOSES   Final diagnoses:  Anaphylaxis, initial encounter  Atrial fibrillation with rapid ventricular response ([place])     Rx / DC Orders   ED Discharge Orders     None        Note:  This document was prepared using Dragon voice recognition software and may include unintentional dictation errors.   Vanessa Ewing, MD 04/19/22 [phone]

## 2022-04-19 NOTE — H&P (Addendum)
History and Physical   Brian Key GYI:[id number] DOB: 11-Jul-1946 DOA: 04/19/2022  PCP: Jonetta Osgood, NP  Patient coming from: Home via EMS  I have personally briefly reviewed patient's old medical records in [place].  Chief Concern: Allergic reaction  HPI: 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  Initial vitals in the emergency department show temperature of 97.8, respiration rate 24, blood pressure was 164/72, heart rate was 147, SPO2 was 90% on 2 L nasal cannula.  Serum sodium is 136, potassium 3.1, chloride 105, bicarb 21, BUN of 21, serum creatinine of 0.89, GFR greater than 60, nonfasting blood glucose 178, WBC 11.4, hemoglobin 17.3, platelets of 178.  BNP is 174.4.  High sensitivity troponin was 8.  Magnesium level was 2.0.  He gave himself an IM dose of Epi.  Per report EMS gave patient: 2 additional doses of epinephrine IV, Solu-Medrol 125 mg IV one-time dose, Pepcid 20 mg IV, Benadryl 50 mg IV,  DuoNeb's x3.  ED treatment: Cardizem 10 mg IV one-time dose, Cardizem 20 mg IV one-time dose, diltiazem gtt. ------------------------------------------- At bedside, he is able to tell me his name, age, current location, and current calendar year.   He reports he was in his yard when 2 bees stung him. He then developed shortness of breath.  He immediately looked for his EpiPen.  Approximately 4-5 minutes after the bee sting, he injected himself with IM epinephrine pen.  He reports that the worsening symptoms of shortness of breath prompting him to call EMS for further assistance.  He states that EMS arrived evaluated him in the kitchen and put him on the gurney x-ray showed possible, placed an IV and transported him to the ED.  After the IV was placed, he MRI reports that EMS gave him to  do more doses of epinephrine, steroids, and Benadryl.    He reports that after EMS placed him on the gurney, he developed chest pressure that was persistent until he arrived to the ED and received medications in the ED.  He reports that at bedside he does not have chest pressure or chest discomfort.  He denies shortness of breath, cough, nausea, vomiting, abdominal pain, dysuria, hematuria, diarrhea  He endorses history of anaphylactic allergies to bee stings.  Social history: He lives with his wife and two dogs. He smokes 1.5 ppd. 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 He denies recreational drug use. He drinks etoh, last drink was evening, two shots of Stevphen Meuse. He drinks 0-once per week.   ROS: Constitutional: no weight change, no fever ENT/Mouth: no sore throat, no rhinorrhea Eyes: no eye pain, no vision changes Cardiovascular: no chest pain, no dyspnea,  no edema, no palpitations Respiratory: no cough, no sputum, no wheezing Gastrointestinal: no nausea, no vomiting, no diarrhea, no constipation Genitourinary: no urinary incontinence, no dysuria, no hematuria Musculoskeletal: no arthralgias, no myalgias Skin: no skin lesions, no pruritus, Neuro: + weakness, no loss of consciousness, no syncope Psych: no anxiety, no depression, no decrease appetite Heme/Lymph: no bruising, no bleeding  ED Course: Discussed with emergency medicine provider, patient requiring hospitalization for chief concerns of atrial fibrillation with RVR.  Assessment/Plan  Principal Problem:   Atrial fibrillation with RVR (HCC) Active Problems:   Neuropathy   Benign  prostatic hyperplasia   Allergic reaction to bee sting   Hypokalemia   Leukocytosis   Elevated brain natriuretic peptide (BNP) level   Assessment and Plan:  * Atrial fibrillation with RVR (HCC) - Presumptive diagnosis secondary to 3 doses of epinephrine (1 IM, 2 IV) received prior to ED  presentation - Check TSH - Continue Cardizem gtt. - Admit to stepdown, inpatient  Elevated brain natriuretic peptide (BNP) level - Presumed secondary to A-fib with RVR in setting of multiple epinephrine injections  Leukocytosis - Mild, suspect reactive secondary to anaphylactic/allergic reaction - CBC in the a.m.  Hypokalemia - Presumed secondary to increase heart rate in setting of A-fib with RVR - Potassium chloride 40 mEq p.o. one-time dose ordered - BMP in the a.m.  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 daily resumed  Chart reviewed.   DVT prophylaxis: Enoxaparin Code Status: Full code Diet: Heart healthy Family Communication: No Disposition Plan: Pending clinical course Consults called: None at this time Admission status: Stepdown, inpatient  Past Medical History:  Diagnosis Date   Back problem    COPD (chronic obstructive pulmonary disease) ([place])    Diabetes mellitus without complication ([place])    Hyperlipidemia    Past Surgical History:  Procedure Laterality Date   ANKLE SURGERY  2009   [place]   HERNIA REPAIR  1829   umbilical   LIPOMA EXCISION  02/16/2017   back/ Dr Bary Castilla   NECK SURGERY     WRIST SURGERY Left 2007   Social History:  reports that he has been smoking cigarettes. He has a 50.00 pack-year smoking history. He has never used smokeless tobacco. He reports current alcohol use. He reports that he does not currently use drugs after having used the following drugs: Marijuana.  Allergies  Allergen Reactions   Bee Pollen Anaphylaxis   Oxycontin [Oxycodone Hcl] Diarrhea    Nausea, severe GI upset   Penicillins Rash    Other reaction(s): Unknown   Family History   Problem Relation Age of Onset   Heart disease Mother    COPD Father    Asthma Father    Prostate cancer Neg Hx    Bladder Cancer Neg Hx    Kidney cancer Neg Hx    Family history: Family history reviewed and not pertinent.  Prior to Admission medications   Medication Sig Start Date End Date Taking? Authorizing Provider  albuterol (VENTOLIN HFA) 108 (90 Base) MCG/ACT inhaler Inhale 2 puffs into the lungs every 6 (six) hours as needed for wheezing or shortness of breath. 12/17/21   Abernathy, Yetta Flock, NP  EPINEPHrine (EPIPEN 2-PAK) 0.3 mg/0.3 mL IJ SOAJ injection Inject 0.3 mg into the muscle as needed for anaphylaxis. 04/06/22   Jonetta Osgood, NP  ipratropium-albuterol (DUONEB) 0.5-2.5 (3) MG/3ML SOLN Take 3 mLs by nebulization every 4 (four) hours as needed (wheezing/SOB/cough). 12/17/21   Jonetta Osgood, NP  meloxicam (MOBIC) 15 MG tablet Take 1 tablet (15 mg total) by mouth daily. with food 02/06/22   Jonetta Osgood, NP  tamsulosin (FLOMAX) 0.4 MG CAPS capsule TAKE 1 CAPSULE BY MOUTH ONCE DAILY 30 MINUTES AFTER LARGEST MEAL. 03/02/22   Billey Co, MD  umeclidinium-vilanterol [facility] ELLIPTA) 62.5-25  MCG/ACT AEPB Inhale 1 puff into the lungs daily. 02/06/22   Jonetta Osgood, NP   Physical Exam: Vitals:   04/19/22 1215 04/19/22 1230 04/19/22 1245 04/19/22 1300  BP: 115/74 138/83 (!) 137/117 132/86  Pulse: (!) 144 (!) 138 (!) 140 (!) 148  Resp: (!) 25 (!) 25 (!) 25 (!) 27  Temp:      TempSrc:      SpO2: 91% 91% 92% 96%   Constitutional: appears age-appropriate, NAD, calm, comfortable Eyes: PERRL, lids and conjunctivae normal ENMT: Mucous membranes are moist. Posterior pharynx clear of any exudate or lesions. Age-appropriate dentition. Hearing appropriate Neck: normal, supple, no masses, no thyromegaly Respiratory: clear to auscultation bilaterally, no wheezing, no crackles. Normal respiratory effort. No accessory muscle use.  Cardiovascular: Tachycardia, with irregular  rhythm, no murmurs / rubs / gallops. No extremity edema. 2+ pedal pulses. No carotid bruits.  Abdomen: no tenderness, no masses palpated, no hepatosplenomegaly. Bowel sounds positive.  Musculoskeletal: no clubbing / cyanosis. No joint deformity upper and lower extremities. Good ROM, no contractures, no atrophy. Normal muscle tone.  Skin: no rashes, lesions, ulcers. No induration Neurologic: Sensation intact. Strength 5/5 in all 4.  Psychiatric: Normal judgment and insight. Alert and oriented x 3. Normal mood.   EKG: independently reviewed, showing atrial fibrillation with RVR, rate of 161, QTc 423  Chest x-ray on Admission: I personally reviewed and I agree with radiologist reading as below.  DG Chest Portable 1 View  Result Date: 04/19/2022 CLINICAL DATA:  Shortness of breath EXAM: PORTABLE CHEST 1 VIEW COMPARISON:  02/02/2020 FINDINGS: The heart size and mediastinal contours are within normal limits. Aortic atherosclerosis. Chronic bronchitic type lung changes bilaterally. No focal airspace consolidation, pleural effusion, or pneumothorax. The visualized skeletal structures are unremarkable. IMPRESSION: No active disease. Electronically Signed   By: Davina Poke D.O.   On: 04/19/2022 10:39    Labs on Admission: I have personally reviewed following labs  CBC: Recent Labs  Lab 04/19/22 1003  WBC 11.4*  NEUTROABS 4.3  HGB 17.3*  HCT 51.8  MCV 95.0  PLT 588   Basic Metabolic Panel: Recent Labs  Lab 04/19/22 1003  NA 136  K 3.1*  CL 105  CO2 21*  GLUCOSE 178*  BUN 21  CREATININE 0.89  CALCIUM 7.8*  MG 2.0   GFR: CrCl cannot be calculated (Unknown ideal weight.).  Liver Function Tests: Recent Labs  Lab 04/19/22 1003  AST 19  ALT 11  ALKPHOS 71  BILITOT 1.0  PROT 6.0*  ALBUMIN 3.6   Urine analysis:    Component Value Date/Time   COLORURINE YELLOW (A) 04/19/2022 1302   APPEARANCEUR CLEAR (A) 04/19/2022 1302   APPEARANCEUR Turbid (A) 02/06/2022 1116    LABSPEC 1.011 04/19/2022 1302   PHURINE 5.0 04/19/2022 1302   GLUCOSEU 50 (A) 04/19/2022 1302   HGBUR NEGATIVE 04/19/2022 1302   BILIRUBINUR NEGATIVE 04/19/2022 1302   BILIRUBINUR Negative 02/06/2022 1116   KETONESUR NEGATIVE 04/19/2022 1302   PROTEINUR NEGATIVE 04/19/2022 1302   NITRITE NEGATIVE 04/19/2022 1302   LEUKOCYTESUR NEGATIVE 04/19/2022 1302   Dr. Tobie Poet [facility]  If 7PM-7AM, please contact overnight-coverage provider If 7AM-7PM, please contact day coverage provider [email or web address]  04/19/2022, 2:13 PM

## 2022-04-19 NOTE — Assessment & Plan Note (Addendum)
-   Presumptive diagnosis secondary to 3 doses of epinephrine (1 IM, 2 IV) received prior to ED presentation - Check TSH - Continue Cardizem gtt. - Admit to stepdown, inpatient

## 2022-04-19 NOTE — Progress Notes (Signed)
Pt had 6 second pause on the heart monitor, and rate dropped to 50s irregular. Cardizem stopped immediately and pt assesseed. Pt does report brief dizziness. BP 104/69. EKG obtained and MD paged.

## 2022-04-20 DIAGNOSIS — I4891 Unspecified atrial fibrillation: Secondary | ICD-10-CM | POA: Diagnosis not present

## 2022-04-20 LAB — GLUCOSE, CAPILLARY
Glucose-Capillary: 167 mg/dL — ABNORMAL HIGH (ref 70–99)
Glucose-Capillary: 201 mg/dL — ABNORMAL HIGH (ref 70–99)

## 2022-04-20 LAB — BASIC METABOLIC PANEL
Anion gap: 6 (ref 5–15)
BUN: 29 mg/dL — ABNORMAL HIGH (ref 8–23)
CO2: 23 mmol/L (ref 22–32)
Calcium: 8.7 mg/dL — ABNORMAL LOW (ref 8.9–10.3)
Chloride: 110 mmol/L (ref 98–111)
Creatinine, Ser: 0.9 mg/dL (ref 0.61–1.24)
GFR, Estimated: >60 mL/min (ref >60)
Glucose, Bld: 171 mg/dL — ABNORMAL HIGH (ref 70–99)
Potassium: 4.5 mmol/L (ref 3.5–5.1)
Sodium: 139 mmol/L (ref 135–145)

## 2022-04-20 LAB — CBC
HCT: 49.2 % (ref 39.0–52.0)
Hemoglobin: 16.6 g/dL (ref 13.0–17.0)
MCH: 31.7 pg (ref 26.0–34.0)
MCHC: 33.7 g/dL (ref 30.0–36.0)
MCV: 93.9 fL (ref 80.0–100.0)
Platelets: 122 10*3/uL — ABNORMAL LOW (ref 150–400)
RBC: 5.24 MIL/uL (ref 4.22–5.81)
RDW: 13.7 % (ref 11.5–15.5)
WBC: 10.3 10*3/uL (ref 4.0–10.5)
nRBC: 0 % (ref 0.0–0.2)

## 2022-04-20 NOTE — Discharge Summary (Signed)
Physician Discharge Summary   Patient: Brian Key MRN: [id number] DOB: 1945-11-18  Admit date:     04/19/2022  Discharge date: 04/20/22  Discharge Physician: Lorella Nimrod   PCP: Jonetta Osgood, NP   Recommendations at discharge:  Follow-up with primary care provider within a week.  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  Initial vitals in the emergency department show temperature of 97.8, respiration rate 24, blood pressure was 164/72, heart rate was 147, SPO2 was 90% on 2 L nasal cannula.  Serum sodium is 136, potassium 3.1, chloride 105, bicarb 21, BUN of 21, serum creatinine of 0.89, GFR greater than 60, nonfasting blood glucose 178, WBC 11.4, hemoglobin 17.3, platelets of 178.  BNP is 174.4.  High sensitivity troponin was 8.  Magnesium level was 2.0.  He gave himself an IM dose of Epi.  Per report EMS gave patient: 2 additional doses of epinephrine IV, Solu-Medrol 125 mg IV one-time dose, Pepcid 20 mg IV, Benadryl 50 mg IV,  DuoNeb's x3.  ED treatment: Cardizem 10 mg IV one-time dose, Cardizem 20 mg IV one-time dose, diltiazem gtt.  Patient was found to be in AF with RVR, presumed secondary to 3 doses of epinephrine.  He was started on Cardizem infusion.  8/28: Patient reverted back to sinus rhythm and remained in sinus rhythm.  Cardizem was stopped last night. Patient thinks that he is at his baseline and requesting discharge. Repeat EKG with normal sinus rhythm and no other significant abnormality.  He was also  requesting a work note, apparently still working at this age which was provided.  Patient will continue on his current medications and follow-up with his providers for further recommendations.  Assessment and Plan: * Atrial fibrillation with RVR (HCC) - Presumptive diagnosis secondary to 3 doses of epinephrine (1 IM, 2 IV) received prior to ED presentation - Check TSH - Continue Cardizem gtt. - Admit to stepdown, inpatient  Elevated brain natriuretic peptide (BNP) level - Presumed secondary to A-fib with RVR in setting of multiple epinephrine injections  Leukocytosis - Mild, suspect reactive secondary to anaphylactic/allergic reaction - CBC in the a.m.  Hypokalemia - Presumed secondary to increase heart rate in setting of A-fib with RVR - Potassium chloride 40 mEq p.o. one-time dose ordered - BMP in the a.m.  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 daily resumed   Consultants: None Procedures performed: None Disposition: Home Diet recommendation:  Discharge Diet Orders (From admission, onward)     Start     Ordered   04/20/22 0000  Diet - low sodium heart healthy        04/20/22 1018           Cardiac diet DISCHARGE MEDICATION: Allergies as of 04/20/2022  Reactions   Bee Pollen Anaphylaxis   Oxycontin [oxycodone Hcl] Diarrhea   Nausea, severe GI upset   Penicillins Rash   Other reaction(s): Unknown        Medication List     TAKE these medications    albuterol 108 (90 Base) MCG/ACT inhaler Commonly known as: VENTOLIN HFA Inhale 2 puffs into the lungs every 6 (six) hours as needed for wheezing or shortness of breath.   Anoro Ellipta 62.5-25 MCG/ACT Aepb Generic drug:  umeclidinium-vilanterol Inhale 1 puff into the lungs daily.   EPINEPHrine 0.3 mg/0.3 mL Soaj injection Commonly known as: EpiPen 2-Pak Inject 0.3 mg into the muscle as needed for anaphylaxis.   ipratropium-albuterol 0.5-2.5 (3) MG/3ML Soln Commonly known as: DUONEB Take 3 mLs by nebulization every 4 (four) hours as needed (wheezing/SOB/cough).   meloxicam 15 MG tablet Commonly known as: MOBIC Take 1 tablet (15 mg total) by mouth daily. with food   tamsulosin 0.4 MG Caps capsule Commonly known as: FLOMAX TAKE 1 CAPSULE BY MOUTH ONCE DAILY 30 MINUTES AFTER LARGEST MEAL.        Discharge Exam: Filed Weights   04/19/22 1415  Weight: 68 kg   General.  Well-developed elderly man, in no acute distress. Pulmonary.  Lungs clear bilaterally, normal respiratory effort. CV.  Regular rate and rhythm, no JVD, rub or murmur. Abdomen.  Soft, nontender, nondistended, BS positive. CNS.  Alert and oriented .  No focal neurologic deficit. Extremities.  No edema, no cyanosis, pulses intact and symmetrical. Psychiatry.  Judgment and insight appears normal.   Condition at discharge: stable  The results of significant diagnostics from this hospitalization (including imaging, microbiology, ancillary and laboratory) are listed below for reference.   Imaging Studies: DG Chest Portable 1 View  Result Date: 04/19/2022 CLINICAL DATA:  Shortness of breath EXAM: PORTABLE CHEST 1 VIEW COMPARISON:  02/02/2020 FINDINGS: The heart size and mediastinal contours are within normal limits. Aortic atherosclerosis. Chronic bronchitic type lung changes bilaterally. No focal airspace consolidation, pleural effusion, or pneumothorax. The visualized skeletal structures are unremarkable. IMPRESSION: No active disease. Electronically Signed   By: Davina Poke D.O.   On: 04/19/2022 10:39    Microbiology: Results for orders placed or performed during the hospital encounter of 04/19/22  MRSA Next Gen by PCR, Nasal      Status: None   Collection Time: 04/19/22  2:19 PM   Specimen: Nasal Mucosa; Nasal Swab  Result Value Ref Range Status   MRSA by PCR Next Gen NOT DETECTED NOT DETECTED Final    Comment: (NOTE) The GeneXpert MRSA Assay (FDA approved for NASAL specimens only), is one component of a comprehensive MRSA colonization surveillance program. It is not intended to diagnose MRSA infection nor to guide or monitor treatment for MRSA infections. Test performance is not FDA approved in patients less than 34 years old. Performed at [facility], [address] [place],  [address]     Labs: CBC: Recent Labs  Lab 04/19/22 1003 04/20/22 0412  WBC 11.4* 10.3  NEUTROABS 4.3  --   HGB 17.3* 16.6  HCT 51.8 49.2  MCV 95.0 93.9  PLT 178 741*   Basic Metabolic Panel: Recent Labs  Lab 04/19/22 1003 04/20/22 0412  NA 136 139  K 3.1* 4.5  CL 105 110  CO2 21* 23  GLUCOSE 178* 171*  BUN 21 29*  CREATININE 0.89 0.90  CALCIUM 7.8* 8.7*  MG 2.0  --    Liver Function Tests: Recent Labs  Lab 04/19/22 1003  AST 19  ALT 11  ALKPHOS 71  BILITOT 1.0  PROT 6.0*  ALBUMIN 3.6   CBG: Recent Labs  Lab 04/19/22 1507 04/19/22 1649 04/19/22 2122 04/20/22 0718  GLUCAP 208* 190* 260* 167*    Discharge time spent: greater than 30 minutes.     This record has been created using Systems analyst. Errors have been sought and corrected,but may not always be located. Such creation errors do not reflect on the standard of care.   Lorella Nimrod, MD [facility] 04/20/2022

## 2022-04-20 NOTE — Care Management CC44 (Signed)
Condition Code 44 Documentation Completed  Patient Details  Name: LINDWOOD MOGEL MRN: [id number] Date of Birth: 1945/11/01   Condition Code 44 given:  Yes Patient signature on Condition Code 44 notice:  Yes Documentation of 2 MD's agreement:  Yes Code 44 added to claim:  Yes    Shelbie Hutching, RN 04/20/2022, 10:39 AM

## 2022-04-20 NOTE — Care Management Obs Status (Signed)
[place] NOTIFICATION   Patient Details  Name: AURTHER HARLIN MRN: [id number] Date of Birth: 1946-07-14   Medicare Observation Status Notification Given:  Yes    Shelbie Hutching, RN 04/20/2022, 10:39 AM

## 2022-04-23 ENCOUNTER — Ambulatory Visit (INDEPENDENT_AMBULATORY_CARE_PROVIDER_SITE_OTHER): Payer: Medicare HMO | Admitting: Nurse Practitioner

## 2022-04-23 ENCOUNTER — Encounter: Payer: Self-pay | Admitting: Nurse Practitioner

## 2022-04-23 VITALS — BP 114/60 | HR 70 | Temp 98.3°F | Resp 16 | Ht 69.0 in | Wt 152.8 lb

## 2022-04-23 DIAGNOSIS — Z09 Encounter for follow-up examination after completed treatment for conditions other than malignant neoplasm: Secondary | ICD-10-CM | POA: Diagnosis not present

## 2022-04-23 DIAGNOSIS — T63441A Toxic effect of venom of bees, accidental (unintentional), initial encounter: Secondary | ICD-10-CM

## 2022-04-23 DIAGNOSIS — I4891 Unspecified atrial fibrillation: Secondary | ICD-10-CM | POA: Diagnosis not present

## 2022-04-23 DIAGNOSIS — R0689 Other abnormalities of breathing: Secondary | ICD-10-CM

## 2022-04-23 MED ORDER — EPINEPHRINE 0.3 MG/0.3ML IJ SOAJ: 0.3000 mg | INTRAMUSCULAR | 2 refills | Status: DC | PRN

## 2022-04-23 NOTE — Progress Notes (Signed)
[facility] [address], [address] [place] [address] [facility] Discharge Acute Issues Care Follow Up                                                                        Patient Demographics  Brian Key, is a 76 y.o. male  DOB [birth year]  MRN [id number].  Primary MD  Jonetta Osgood, NP   Reason for TCC follow Up - Anaphylaxis, allergic reaction to bee sting, atrial fibrillation with RVR.   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   Initial vitals in the emergency department show temperature of 97.8, respiration rate 24, blood pressure was 164/72, heart rate was 147, SPO2 was 90% on 2 L nasal cannula.   Serum sodium is 136, potassium 3.1, chloride 105, bicarb 21, BUN of 21, serum creatinine of 0.89, GFR greater than 60, nonfasting blood glucose 178, WBC 11.4, hemoglobin 17.3, platelets of 178.   BNP is 174.4.  High sensitivity troponin was 8.  Magnesium level was 2.0.   He gave himself an IM dose of Epi.   Per report EMS gave patient: 2 additional doses of epinephrine IV, Solu-Medrol 125 mg IV one-time dose, Pepcid 20 mg IV, Benadryl 50 mg IV,  DuoNeb's x3.   ED treatment: Cardizem 10 mg IV one-time dose,  Cardizem 20 mg IV one-time dose, diltiazem gtt.   Patient was found to be in AF with RVR, presumed secondary to 3 doses of epinephrine.  He was started on Cardizem infusion.   8/28: Patient reverted back to sinus rhythm and remained in sinus rhythm.  Cardizem was stopped last night. Patient thinks that he is at his baseline and requesting discharge. Repeat EKG with normal sinus rhythm and no other significant abnormality.  He was also requesting a work note, apparently still working at this age which was provided.   Patient will continue on his current medications and follow-up with his providers for further recommendations.   Assessment and Plan: * Atrial fibrillation with RVR (HCC) - Presumptive diagnosis secondary to 3 doses of epinephrine (1 IM, 2 IV) received prior to ED presentation - Check TSH - Continue Cardizem gtt. - Admit to stepdown, inpatient   Elevated brain  natriuretic peptide (BNP) level - Presumed secondary to A-fib with RVR in setting of multiple epinephrine injections   Leukocytosis - Mild, suspect reactive secondary to anaphylactic/allergic reaction - CBC in the a.m.   Hypokalemia - Presumed secondary to increase heart rate in setting of A-fib with RVR - Potassium chloride 40 mEq p.o. one-time dose ordered - BMP in the a.m.   Allergic reaction to bee sting - Patient is status post Solu-Medrol 125 mg IV, Pepcid 20 mg IV, Benadryl 50 mg IV, 2 doses of epinephrine, and DuoNebs x3 per EMS - Benadryl 50 mg IV every 6 hours as needed for allergies and itching, 4 doses ordered   Benign prostatic hyperplasia - Tamsulosin 0.4 mg p.o. daily resumed    [place] Issue to be followed in the Clinic   Principal Problem:   Atrial fibrillation with RVR ([place]) Active Problems:   Neuropathy   Benign prostatic hyperplasia   Allergic reaction to bee sting   Hypokalemia   Leukocytosis   Elevated brain natriuretic peptide (BNP) level   Subjective:    Brian Key today has, No headache, No chest pain, No abdominal pain - No Nausea, No new weakness tingling or numbness, No Cough - SOB. Denies any difficulty breathing, chest tightness, chest pain or swelling of the throat or mouth. He reports feeling slight fatigue, low energy but otherwise fine.   Assessment & Plan   1. Hospital discharge follow-up Anaphylactic reaction to bee stings x3 at home, self administered epi pen x1, used rescue inhaler several times with no improvement. His wife, Enid Derry called 911 and he received 2 more doses of epinephrine, IV solu-medrolx1 and IV benadryl x1. By the time he was in the ER, his heart rhythm had changed to atrial fibrillation with RVR.   2. Atrial fibrillation with RVR (HCC) Drug-induced atrial fibrillation after 3 doses of epinephrine to treat anaphylaxis. Treated in hospital with a cardizem drip for about 5 hours, converted by the next morning and was discharged  3. Anaphylactic reaction to bee sting, accidental or unintentional, initial encounter Reorder epi-pen to replace what was used - EPINEPHrine (EPIPEN 2-PAK) 0.3 mg/0.3 mL IJ SOAJ injection; Inject 0.3 mg into the muscle as needed for anaphylaxis.  Dispense: 2 each; Refill: 2  4. Difficulty breathing Continue to keep epi pen on hand at home and at work.  - EPINEPHrine (EPIPEN 2-PAK) 0.3 mg/0.3 mL IJ SOAJ injection; Inject 0.3 mg into the muscle as needed for anaphylaxis.  Dispense: 2 each; Refill: 2     Reason for frequent admissions/ER visits    COPD AFIB URI pneumonia   Objective:   Vitals:   04/23/22 1425  BP: 114/60  Pulse: 70  Resp: 16  Temp: 98.3 F (36.8 C)  SpO2: 96%  Weight: 152 lb 12.8 oz (69.3 kg)  Height: '5\' 9"'$  (1.753 m)    Wt Readings from Last 3 Encounters:  04/23/22 152 lb 12.8 oz (69.3 kg)  04/19/22 149 lb 14.6 oz (68 kg)  03/19/22 145 lb (65.8 kg)    Allergies as of 04/23/2022       Reactions   Bee Pollen Anaphylaxis   Oxycontin [oxycodone  Hcl] Diarrhea   Nausea, severe GI upset   Penicillins Rash   Other reaction(s): Unknown        Medication List        Accurate as of April 23, 2022  7:23 PM. If you have any questions, ask your nurse or doctor.  albuterol 108 (90 Base) MCG/ACT inhaler Commonly known as: VENTOLIN HFA Inhale 2 puffs into the lungs every 6 (six) hours as needed for wheezing or shortness of breath.   Anoro Ellipta 62.5-25 MCG/ACT Aepb Generic drug: umeclidinium-vilanterol Inhale 1 puff into the lungs daily.   EPINEPHrine 0.3 mg/0.3 mL Soaj injection Commonly known as: EpiPen 2-Pak Inject 0.3 mg into the muscle as needed for anaphylaxis.   ipratropium-albuterol 0.5-2.5 (3) MG/3ML Soln Commonly known as: DUONEB Take 3 mLs by nebulization every 4 (four) hours as needed (wheezing/SOB/cough).   meloxicam 15 MG tablet Commonly known as: MOBIC Take 1 tablet (15 mg total) by mouth daily. with food   tamsulosin 0.4 MG Caps capsule Commonly known as: FLOMAX TAKE 1 CAPSULE BY MOUTH ONCE DAILY 30 MINUTES AFTER LARGEST MEAL.         Physical Exam: Constitutional: Patient appears well-developed and well-nourished. Not in obvious distress. HENT: Normocephalic, atraumatic, External right and left ear normal. Oropharynx is clear and moist.  Eyes: Conjunctivae and EOM are normal. PERRLA, no scleral icterus. Neck: Normal ROM. Neck supple. No JVD. No tracheal deviation. No thyromegaly. CVS: RRR, S1/S2 +, no murmurs, no gallops, no carotid bruit.  Pulmonary: Effort and breath sounds normal, no stridor, rhonchi, wheezes, rales.  Abdominal: Soft. BS +, no distension, tenderness, rebound or guarding.  Musculoskeletal: Normal range of motion. No edema and no tenderness.  Lymphadenopathy: No lymphadenopathy noted, cervical, inguinal or axillary Neuro: Alert. Normal reflexes, muscle tone coordination. No cranial nerve deficit. Skin: Skin is warm and dry. No rash noted. Not diaphoretic. No  erythema. No pallor. Psychiatric: Normal mood and affect. Behavior, judgment, thought content normal.   Data Review   Micro Results Recent Results (from the past 240 hour(s))  MRSA Next Gen by PCR, Nasal     Status: None   Collection Time: 04/19/22  2:19 PM   Specimen: Nasal Mucosa; Nasal Swab  Result Value Ref Range Status   MRSA by PCR Next Gen NOT DETECTED NOT DETECTED Final    Comment: (NOTE) The GeneXpert MRSA Assay (FDA approved for NASAL specimens only), is one component of a comprehensive MRSA colonization surveillance program. It is not intended to diagnose MRSA infection nor to guide or monitor treatment for MRSA infections. Test performance is not FDA approved in patients less than 28 years old. Performed at [facility], [address] [place], [place] [address]      CBC Recent Labs  Lab 04/19/22 1003 04/20/22 0412  WBC 11.4* 10.3  HGB 17.3* 16.6  HCT 51.8 49.2  PLT 178 122*  MCV 95.0 93.9  MCH 31.7 31.7  MCHC 33.4 33.7  RDW 13.7 13.7  LYMPHSABS 6.4*  --   MONOABS 0.5  --   EOSABS 0.2  --   BASOSABS 0.0  --     Chemistries  Recent Labs  Lab 04/19/22 1003 04/20/22 0412  NA 136 139  K 3.1* 4.5  CL 105 110  CO2 21* 23  GLUCOSE 178* 171*  BUN 21 29*  CREATININE 0.89 0.90  CALCIUM 7.8* 8.7*  MG 2.0  --   AST 19  --   ALT 11  --   ALKPHOS 71  --   BILITOT 1.0  --    ------------------------------------------------------------------------------------------------------------------ estimated creatinine clearance is 70.6 mL/min (by C-G formula based on SCr of 0.9 mg/dL). ------------------------------------------------------------------------------------------------------------------ No results for input(s): "HGBA1C" in the last 72 hours. ------------------------------------------------------------------------------------------------------------------ No results for input(s): "CHOL", "HDL", "LDLCALC", "TRIG", "CHOLHDL", "LDLDIRECT" in  the  last 72 hours. ------------------------------------------------------------------------------------------------------------------ No results for input(s): "TSH", "T4TOTAL", "T3FREE", "THYROIDAB" in the last 72 hours.  Invalid input(s): "FREET3" ------------------------------------------------------------------------------------------------------------------ No results for input(s): "VITAMINB12", "FOLATE", "FERRITIN", "TIBC", "IRON", "RETICCTPCT" in the last 72 hours.  Coagulation profile No results for input(s): "INR", "PROTIME" in the last 168 hours.  No results for input(s): "DDIMER" in the last 72 hours.  Cardiac Enzymes No results for input(s): "CKMB", "TROPONINI", "MYOGLOBIN" in the last 168 hours.  Invalid input(s): "CK" ------------------------------------------------------------------------------------------------------------------ Invalid input(s): "POCBNP"  Return for previously scheduled, CPE coming up.   Time Spent in minutes  45 Time spent with patient included reviewing progress notes, labs, imaging studies, and discussing plan for follow up.   This patient was seen by Jonetta Osgood, FNP-C in collaboration with Dr. Clayborn Bigness as a part of collaborative care agreement.    Jonetta Osgood MSN, FNP-C on 04/23/2022 at 7:23 PM   **Disclaimer: This note may have been dictated with voice recognition software. Similar sounding words can inadvertently be transcribed and this note may contain transcription errors which may not have been corrected upon publication of note.**

## 2022-06-02 ENCOUNTER — Encounter: Payer: Self-pay | Admitting: Nurse Practitioner

## 2022-06-02 ENCOUNTER — Ambulatory Visit (INDEPENDENT_AMBULATORY_CARE_PROVIDER_SITE_OTHER): Payer: Medicare HMO | Admitting: Nurse Practitioner

## 2022-06-02 VITALS — BP 150/80 | HR 78 | Temp 97.5°F | Resp 16 | Ht 69.0 in | Wt 149.6 lb

## 2022-06-02 DIAGNOSIS — J209 Acute bronchitis, unspecified: Secondary | ICD-10-CM

## 2022-06-02 DIAGNOSIS — J440 Chronic obstructive pulmonary disease with acute lower respiratory infection: Secondary | ICD-10-CM | POA: Diagnosis not present

## 2022-06-02 DIAGNOSIS — R6889 Other general symptoms and signs: Secondary | ICD-10-CM | POA: Diagnosis not present

## 2022-06-02 LAB — POCT INFLUENZA A/B
Influenza A, POC: NEGATIVE
Influenza B, POC: NEGATIVE

## 2022-06-02 MED ORDER — AZITHROMYCIN 250 MG PO TABS: ORAL_TABLET | ORAL | 0 refills | Status: DC

## 2022-06-02 MED ORDER — PREDNISONE 10 MG PO TABS: ORAL_TABLET | ORAL | 0 refills | Status: DC

## 2022-06-02 NOTE — Progress Notes (Signed)
[facility] [place],  [address]  Internal MEDICINE  Office Visit Note  Patient Name: Brian Key  [id number]  [id number]  Date of Service: 06/02/2022  Chief Complaint  Patient presents with   Acute Visit    Since Sunday morning, no covid test    Headache   Vomiting   Diarrhea   Fever     HPI Brian Key presents for an acute sick visit for possible URI with flu-like symptoms. Has not tested for covid.  --sx started on Sunday morning with SOB, headache, diarrhea, fever, chest tightness, some wheezing and cough --hx COPD and current daily smoker  --tested negative for flu today.  --takes breath every 5-7 words    Current Medication:  Outpatient Encounter Medications as of 06/02/2022  Medication Sig   albuterol (VENTOLIN HFA) 108 (90 Base) MCG/ACT inhaler Inhale 2 puffs into the lungs every 6 (six) hours as needed for wheezing or shortness of breath.   azithromycin (ZITHROMAX) 250 MG tablet Take one tab a day for 10 days for uri   EPINEPHrine (EPIPEN 2-PAK) 0.3 mg/0.3 mL IJ SOAJ injection Inject 0.3 mg into the muscle as needed for anaphylaxis.   ipratropium-albuterol (DUONEB) 0.5-2.5 (3) MG/3ML SOLN Take 3 mLs by nebulization every 4 (four) hours as needed (wheezing/SOB/cough).   meloxicam (MOBIC) 15 MG tablet Take 1 tablet (15 mg total) by mouth daily. with food   predniSONE (DELTASONE) 10 MG tablet Take one tab 3 x day for 3 days, then take one tab 2 x a day for 3 days and then take one tab a day for 3 days for copd   tamsulosin (FLOMAX) 0.4 MG CAPS capsule TAKE 1 CAPSULE BY MOUTH ONCE DAILY 30 MINUTES AFTER LARGEST MEAL.   umeclidinium-vilanterol (ANORO ELLIPTA) 62.5-25 MCG/ACT AEPB Inhale 1 puff into the lungs daily.   No facility-administered encounter medications on file as of 06/02/2022.      Medical History: Past Medical History:  Diagnosis Date   Back problem    COPD (chronic obstructive pulmonary disease) (HCC)    Diabetes  mellitus without complication (HCC)    Hyperlipidemia      Vital Signs: BP (!) 150/80 Comment: 179/85  Pulse 78   Temp (!) 97.5 F (36.4 C)   Resp 16   Ht '5\' 9"'$  (1.753 m)   Wt 149 lb 9.6 oz (67.9 kg)   SpO2 95%   BMI 22.09 kg/m    Review of Systems  Constitutional:  Positive for chills, fatigue and fever.  Respiratory:  Positive for cough, chest tightness, shortness of breath and wheezing.   Cardiovascular: Negative.  Negative for chest pain and palpitations.  Gastrointestinal:  Positive for diarrhea. Negative for constipation and nausea.  Musculoskeletal:  Positive for myalgias.  Neurological:  Positive for headaches.    Physical Exam HENT:     Head: Normocephalic and atraumatic.  Cardiovascular:     Rate and Rhythm: Regular rhythm. Tachycardia present.     Heart sounds: Normal heart sounds.  Pulmonary:     Effort: Accessory muscle usage present. No respiratory distress.     Breath sounds: Decreased air movement present. Examination of the right-upper field reveals decreased breath sounds. Examination of the left-upper field reveals decreased breath sounds. Examination of the right-middle field reveals decreased breath sounds. Examination of the left-middle field reveals decreased breath sounds. Examination of the right-lower field reveals decreased breath sounds and wheezing. Examination of the left-lower field reveals decreased breath sounds and wheezing. Decreased breath  sounds and wheezing present.       Assessment/Plan: 1. Acute bronchitis with COPD ([place]) 10 day zpack and prednisone taper prescribed. Also recommended patient to get tested for covid  - azithromycin (ZITHROMAX) 250 MG tablet; Take one tab a day for 10 days for uri  Dispense: 10 tablet; Refill: 0 - predniSONE (DELTASONE) 10 MG tablet; Take one tab 3 x day for 3 days, then take one tab 2 x a day for 3 days and then take one tab a day for 3 days for copd  Dispense: 18 tablet; Refill: 0  2. Flu-like  symptoms Negative for flu - POCT Influenza A/B   General Counseling: Brian Key verbalizes understanding of the findings of todays visit and agrees with plan of treatment. I have discussed any further diagnostic evaluation that may be needed or ordered today. We also reviewed his medications today. he has been encouraged to call the office with any questions or concerns that should arise related to todays visit.    Counseling:    Orders Placed This Encounter  Procedures   POCT Influenza A/B    Meds ordered this encounter  Medications   azithromycin (ZITHROMAX) 250 MG tablet    Sig: Take one tab a day for 10 days for uri    Dispense:  10 tablet    Refill:  0   predniSONE (DELTASONE) 10 MG tablet    Sig: Take one tab 3 x day for 3 days, then take one tab 2 x a day for 3 days and then take one tab a day for 3 days for copd    Dispense:  18 tablet    Refill:  0    Return if symptoms worsen or fail to improve.  [place] Controlled Substance Database was reviewed by me for overdose risk score (ORS)  Time spent:20 Minutes Time spent with patient included reviewing progress notes, labs, imaging studies, and discussing plan for follow up.   This patient was seen by Jonetta Osgood, FNP-C in collaboration with Dr. Clayborn Bigness as a part of collaborative care agreement.  Lakenya Riendeau R. Valetta Fuller, MSN, FNP-C Internal Medicine

## 2022-06-04 ENCOUNTER — Inpatient Hospital Stay
Admission: EM | Admit: 2022-06-04 | Discharge: 2022-06-11 | DRG: 190 | Disposition: A | Discharge status: Home / Self Care | Payer: Medicare HMO | Attending: Internal Medicine | Admitting: Internal Medicine

## 2022-06-04 ENCOUNTER — Emergency Department: Payer: Medicare HMO

## 2022-06-04 ENCOUNTER — Telehealth: Payer: Self-pay

## 2022-06-04 DIAGNOSIS — J9602 Acute respiratory failure with hypercapnia: Secondary | ICD-10-CM | POA: Diagnosis present

## 2022-06-04 DIAGNOSIS — Z743 Need for continuous supervision: Secondary | ICD-10-CM | POA: Diagnosis not present

## 2022-06-04 DIAGNOSIS — K621 Rectal polyp: Secondary | ICD-10-CM | POA: Diagnosis present

## 2022-06-04 DIAGNOSIS — E785 Hyperlipidemia, unspecified: Secondary | ICD-10-CM | POA: Diagnosis present

## 2022-06-04 DIAGNOSIS — F17200 Nicotine dependence, unspecified, uncomplicated: Secondary | ICD-10-CM

## 2022-06-04 DIAGNOSIS — I119 Hypertensive heart disease without heart failure: Secondary | ICD-10-CM | POA: Diagnosis present

## 2022-06-04 DIAGNOSIS — R069 Unspecified abnormalities of breathing: Secondary | ICD-10-CM | POA: Diagnosis not present

## 2022-06-04 DIAGNOSIS — Z6821 Body mass index (BMI) 21.0-21.9, adult: Secondary | ICD-10-CM

## 2022-06-04 DIAGNOSIS — I10 Essential (primary) hypertension: Secondary | ICD-10-CM | POA: Diagnosis not present

## 2022-06-04 DIAGNOSIS — K922 Gastrointestinal hemorrhage, unspecified: Secondary | ICD-10-CM | POA: Insufficient documentation

## 2022-06-04 DIAGNOSIS — Z8249 Family history of ischemic heart disease and other diseases of the circulatory system: Secondary | ICD-10-CM

## 2022-06-04 DIAGNOSIS — J441 Chronic obstructive pulmonary disease with (acute) exacerbation: Secondary | ICD-10-CM | POA: Diagnosis not present

## 2022-06-04 DIAGNOSIS — E43 Unspecified severe protein-calorie malnutrition: Secondary | ICD-10-CM | POA: Diagnosis present

## 2022-06-04 DIAGNOSIS — Z5309 Procedure and treatment not carried out because of other contraindication: Secondary | ICD-10-CM | POA: Diagnosis not present

## 2022-06-04 DIAGNOSIS — K2970 Gastritis, unspecified, without bleeding: Secondary | ICD-10-CM | POA: Diagnosis present

## 2022-06-04 DIAGNOSIS — R0603 Acute respiratory distress: Secondary | ICD-10-CM | POA: Diagnosis present

## 2022-06-04 DIAGNOSIS — K529 Noninfective gastroenteritis and colitis, unspecified: Secondary | ICD-10-CM | POA: Diagnosis present

## 2022-06-04 DIAGNOSIS — K269 Duodenal ulcer, unspecified as acute or chronic, without hemorrhage or perforation: Secondary | ICD-10-CM | POA: Diagnosis present

## 2022-06-04 DIAGNOSIS — D62 Acute posthemorrhagic anemia: Secondary | ICD-10-CM | POA: Diagnosis not present

## 2022-06-04 DIAGNOSIS — Z20822 Contact with and (suspected) exposure to covid-19: Secondary | ICD-10-CM | POA: Diagnosis present

## 2022-06-04 DIAGNOSIS — Z791 Long term (current) use of non-steroidal anti-inflammatories (NSAID): Secondary | ICD-10-CM

## 2022-06-04 DIAGNOSIS — Z7951 Long term (current) use of inhaled steroids: Secondary | ICD-10-CM

## 2022-06-04 DIAGNOSIS — I4891 Unspecified atrial fibrillation: Secondary | ICD-10-CM | POA: Diagnosis present

## 2022-06-04 DIAGNOSIS — J9601 Acute respiratory failure with hypoxia: Secondary | ICD-10-CM | POA: Diagnosis present

## 2022-06-04 DIAGNOSIS — R0602 Shortness of breath: Secondary | ICD-10-CM | POA: Diagnosis not present

## 2022-06-04 DIAGNOSIS — R0902 Hypoxemia: Secondary | ICD-10-CM | POA: Diagnosis not present

## 2022-06-04 DIAGNOSIS — E1151 Type 2 diabetes mellitus with diabetic peripheral angiopathy without gangrene: Secondary | ICD-10-CM | POA: Diagnosis present

## 2022-06-04 DIAGNOSIS — K5520 Angiodysplasia of colon without hemorrhage: Secondary | ICD-10-CM | POA: Diagnosis present

## 2022-06-04 DIAGNOSIS — Z79899 Other long term (current) drug therapy: Secondary | ICD-10-CM

## 2022-06-04 DIAGNOSIS — F17210 Nicotine dependence, cigarettes, uncomplicated: Secondary | ICD-10-CM | POA: Diagnosis present

## 2022-06-04 DIAGNOSIS — N400 Enlarged prostate without lower urinary tract symptoms: Secondary | ICD-10-CM | POA: Diagnosis present

## 2022-06-04 DIAGNOSIS — R0689 Other abnormalities of breathing: Secondary | ICD-10-CM | POA: Diagnosis not present

## 2022-06-04 LAB — BLOOD GAS, VENOUS
Acid-Base Excess: 3.6 mmol/L — ABNORMAL HIGH (ref 0.0–2.0)
Bicarbonate: 31.4 mmol/L — ABNORMAL HIGH (ref 20.0–28.0)
Delivery systems: POSITIVE
FIO2: 60 %
O2 Saturation: 81.3 %
Patient temperature: 37
pCO2, Ven: 61 mmHg — ABNORMAL HIGH (ref 44–60)
pH, Ven: 7.32 (ref 7.25–7.43)
pO2, Ven: 49 mmHg — ABNORMAL HIGH (ref 32–45)

## 2022-06-04 LAB — URINALYSIS, ROUTINE W REFLEX MICROSCOPIC
Bilirubin Urine: NEGATIVE
Glucose, UA: NEGATIVE mg/dL
Ketones, ur: NEGATIVE mg/dL
Leukocytes,Ua: NEGATIVE
Nitrite: NEGATIVE
Protein, ur: NEGATIVE mg/dL
Specific Gravity, Urine: 1.016 (ref 1.005–1.030)
Squamous Epithelial / HPF: NONE SEEN (ref 0–5)
pH: 6 (ref 5.0–8.0)

## 2022-06-04 LAB — CBC
HCT: 53.8 % — ABNORMAL HIGH (ref 39.0–52.0)
Hemoglobin: 17.6 g/dL — ABNORMAL HIGH (ref 13.0–17.0)
MCH: 31.6 pg (ref 26.0–34.0)
MCHC: 32.7 g/dL (ref 30.0–36.0)
MCV: 96.6 fL (ref 80.0–100.0)
Platelets: 201 10*3/uL (ref 150–400)
RBC: 5.57 MIL/uL (ref 4.22–5.81)
RDW: 13.3 % (ref 11.5–15.5)
WBC: 9.7 10*3/uL (ref 4.0–10.5)
nRBC: 0 % (ref 0.0–0.2)

## 2022-06-04 LAB — BASIC METABOLIC PANEL
Anion gap: 11 (ref 5–15)
BUN: 22 mg/dL (ref 8–23)
CO2: 29 mmol/L (ref 22–32)
Calcium: 9 mg/dL (ref 8.9–10.3)
Chloride: 103 mmol/L (ref 98–111)
Creatinine, Ser: 0.91 mg/dL (ref 0.61–1.24)
GFR, Estimated: >60 mL/min (ref >60)
Glucose, Bld: 165 mg/dL — ABNORMAL HIGH (ref 70–99)
Potassium: 4.2 mmol/L (ref 3.5–5.1)
Sodium: 143 mmol/L (ref 135–145)

## 2022-06-04 LAB — TROPONIN I (HIGH SENSITIVITY): Troponin I (High Sensitivity): 23 ng/L — ABNORMAL HIGH (ref <18)

## 2022-06-04 LAB — RESP PANEL BY RT-PCR (FLU A&B, COVID) ARPGX2
Influenza A by PCR: NEGATIVE
Influenza B by PCR: NEGATIVE
SARS Coronavirus 2 by RT PCR: NEGATIVE

## 2022-06-04 MED ORDER — ALBUTEROL SULFATE (2.5 MG/3ML) 0.083% IN NEBU
5.0000 mg | INHALATION_SOLUTION | Freq: Once | RESPIRATORY_TRACT | Status: AC
  Administered 2022-06-04: 5 mg via RESPIRATORY_TRACT
  Filled 2022-06-04: qty 6

## 2022-06-04 MED ORDER — SODIUM CHLORIDE 0.9 % IV SOLN
500.0000 mg | Freq: Once | INTRAVENOUS | Status: AC
  Administered 2022-06-04: 500 mg via INTRAVENOUS
  Filled 2022-06-04: qty 5

## 2022-06-04 MED ORDER — ALBUTEROL SULFATE (2.5 MG/3ML) 0.083% IN NEBU
INHALATION_SOLUTION | RESPIRATORY_TRACT | Status: AC
  Filled 2022-06-04: qty 9

## 2022-06-04 MED ORDER — ALBUTEROL SULFATE (2.5 MG/3ML) 0.083% IN NEBU
7.5000 mg | INHALATION_SOLUTION | RESPIRATORY_TRACT | Status: AC
  Administered 2022-06-04: 7.5 mg via RESPIRATORY_TRACT
  Filled 2022-06-04: qty 9

## 2022-06-04 MED ORDER — ETOMIDATE 2 MG/ML IV SOLN
INTRAVENOUS | Status: AC
  Filled 2022-06-04: qty 20

## 2022-06-04 MED ORDER — ROCURONIUM BROMIDE 10 MG/ML (PF) SYRINGE
PREFILLED_SYRINGE | INTRAVENOUS | Status: AC
  Filled 2022-06-04: qty 10

## 2022-06-04 MED ORDER — LORAZEPAM 2 MG/ML IJ SOLN
1.0000 mg | Freq: Once | INTRAMUSCULAR | Status: AC
  Administered 2022-06-04: 1 mg via INTRAVENOUS

## 2022-06-04 MED ORDER — LORAZEPAM 2 MG/ML IJ SOLN
INTRAMUSCULAR | Status: AC
  Filled 2022-06-04: qty 1

## 2022-06-04 NOTE — H&P (Signed)
History and Physical    Chief Complaint: Shortness of breath   HISTORY OF PRESENT ILLNESS: Brian Key is an 76 y.o. male presenting to the emergency room with shortness of breath that is been going on for the past few days. Patient was given Z-Pak and prednisone per telephone chart message. In the emergency room patient had increased work of breathing decreased air exchange on exam and ED provider said he was very close to be Intubated.Patient has had history of BiPAP CPAP use for the same.  HPI is limited secondary to patient being on BiPAP and acuity of illness.     Pt has PMH as below: Past Medical History:  Diagnosis Date   Back problem    COPD (chronic obstructive pulmonary disease) ([place])    Diabetes mellitus without complication ([place])    Hyperlipidemia      Review of Systems  Respiratory:  Positive for shortness of breath.       Allergies  Allergen Reactions   Bee Pollen Anaphylaxis   Oxycontin [Oxycodone Hcl] Diarrhea    Nausea, severe GI upset   Penicillins Rash    Other reaction(s): Unknown     Past Surgical History:  Procedure Laterality Date   ANKLE SURGERY  2009   [place]   HERNIA REPAIR  9381   umbilical   LIPOMA EXCISION  02/16/2017   back/ Dr Bary Castilla   NECK SURGERY     WRIST SURGERY Left 2007      Social History   Socioeconomic History   Marital status: Married    Spouse name: Not on file   Number of children: Not on file   Years of education: Not on file   Highest education level: Not on file  Occupational History   Not on file  Tobacco Use   Smoking status: Every Day    Packs/day: 1.00    Years: 50.00    Total pack years: 50.00    Types: Cigarettes   Smokeless tobacco: Never  Vaping Use   Vaping Use: Never used  Substance and Sexual Activity   Alcohol use: Yes    Comment: occasionally   Drug use: Not Currently    Types: Marijuana   Sexual activity: Yes    Birth control/protection: None  Other  Topics Concern   Not on file  Social History Narrative   Not on file   Social Determinants of Health   Financial Resource Strain: Not on file  Food Insecurity: Not on file  Transportation Needs: Not on file  Physical Activity: Not on file  Stress: Not on file  Social Connections: Not on file      CURRENT MEDS:  Current Facility-Administered Medications (Endocrine & Metabolic):    methylPREDNISolone sodium succinate (SOLU-MEDROL) 125 mg/2 mL injection 60 mg **IN FOLLOWED-BY LINKED GROUP WITH** [START ON 06/06/2022] predniSONE (DELTASONE) tablet 40 mg  Current Outpatient Medications (Endocrine & Metabolic):    predniSONE (DELTASONE) 10 MG tablet, Take one tab 3 x day for 3 days, then take one tab 2 x a day for 3 days and then take one tab a day for 3 days for copd (Patient not taking: Reported on 06/04/2022)  Current Facility-Administered Medications (Cardiovascular):    furosemide (LASIX) injection 20 mg   hydrALAZINE (APRESOLINE) injection 5 mg  Current Outpatient Medications (Cardiovascular):    EPINEPHrine (EPIPEN 2-PAK) 0.3 mg/0.3 mL IJ SOAJ injection, Inject 0.3 mg into the muscle as needed for anaphylaxis.  Current Facility-Administered Medications (Respiratory):  albuterol (PROVENTIL) (2.5 MG/3ML) 0.083% nebulizer solution 2.5 mg   guaiFENesin (MUCINEX) 12 hr tablet 600 mg   ipratropium-albuterol (DUONEB) 0.5-2.5 (3) MG/3ML nebulizer solution 3 mL  Current Outpatient Medications (Respiratory):    umeclidinium-vilanterol (ANORO ELLIPTA) 62.5-25 MCG/ACT AEPB, Inhale 1 puff into the lungs daily.   albuterol (VENTOLIN HFA) 108 (90 Base) MCG/ACT inhaler, Inhale 2 puffs into the lungs every 6 (six) hours as needed for wheezing or shortness of breath.   ipratropium-albuterol (DUONEB) 0.5-2.5 (3) MG/3ML SOLN, Take 3 mLs by nebulization every 4 (four) hours as needed (wheezing/SOB/cough).  Current Facility-Administered Medications (Analgesics):    acetaminophen (TYLENOL)  tablet 650 mg **OR** acetaminophen (TYLENOL) suppository 650 mg  Current Outpatient Medications (Analgesics):    meloxicam (MOBIC) 15 MG tablet, Take 1 tablet (15 mg total) by mouth daily. with food  Current Facility-Administered Medications (Hematological):    heparin injection 5,000 Units   Current Facility-Administered Medications (Other):    bisacodyl (DULCOLAX) EC tablet 5 mg   doxycycline (VIBRA-TABS) tablet 100 mg   nicotine (NICODERM CQ - dosed in mg/24 hours) patch 14 mg   ondansetron (ZOFRAN) tablet 4 mg **OR** ondansetron (ZOFRAN) injection 4 mg   pantoprazole (PROTONIX) injection 40 mg   sodium chloride flush (NS) 0.9 % injection 3 mL   thiamine (VITAMIN B1) injection 100 mg  Current Outpatient Medications (Other):    tamsulosin (FLOMAX) 0.4 MG CAPS capsule, TAKE 1 CAPSULE BY MOUTH ONCE DAILY 30 MINUTES AFTER LARGEST MEAL.   azithromycin (ZITHROMAX) 250 MG tablet, Take one tab a day for 10 days for uri (Patient not taking: Reported on 06/04/2022)    ED Course: Pt in Ed is somnolent wakes up answers questions but has BiPAP . Not tachypneic or dyspneic.  Vitals:   06/04/22 2230 06/04/22 2300 06/04/22 2330 06/05/22 0000  BP: (!) 154/68 (!) 149/100 (!) 158/75 126/62  Pulse: 95 (!) 112 (!) 106 98  Resp: (!) 29 (!) 33 (!) 31 (!) 29  Temp:      TempSrc:      SpO2: 98% 100% 98% 95%   Total I/O In: 250 [IV Piggyback:250] Out: -  SpO2: 95 % Blood work in ed shows  Results for orders placed or performed during the hospital encounter of 06/04/22 (from the past 24 hour(s))  CBC     Status: Abnormal   Collection Time: 06/04/22 10:14 PM  Result Value Ref Range   WBC 9.7 4.0 - 10.5 K/uL   RBC 5.57 4.22 - 5.81 MIL/uL   Hemoglobin 17.6 (H) 13.0 - 17.0 g/dL   HCT 53.8 (H) 39.0 - 52.0 %   MCV 96.6 80.0 - 100.0 fL   MCH 31.6 26.0 - 34.0 pg   MCHC 32.7 30.0 - 36.0 g/dL   RDW 13.3 11.5 - 15.5 %   Platelets 201 150 - 400 K/uL   nRBC 0.0 0.0 - 0.2 %  Basic metabolic panel      Status: Abnormal   Collection Time: 06/04/22 10:14 PM  Result Value Ref Range   Sodium 143 135 - 145 mmol/L   Potassium 4.2 3.5 - 5.1 mmol/L   Chloride 103 98 - 111 mmol/L   CO2 29 22 - 32 mmol/L   Glucose, Bld 165 (H) 70 - 99 mg/dL   BUN 22 8 - 23 mg/dL   Creatinine, Ser 0.91 0.61 - 1.24 mg/dL   Calcium 9.0 8.9 - 10.3 mg/dL   GFR, Estimated >60 >60 mL/min   Anion gap 11 5 - 15  Resp Panel by RT-PCR (Flu A&B, Covid) Anterior Nasal Swab     Status: None   Collection Time: 06/04/22 10:14 PM   Specimen: Anterior Nasal Swab  Result Value Ref Range   SARS Coronavirus 2 by RT PCR NEGATIVE NEGATIVE   Influenza A by PCR NEGATIVE NEGATIVE   Influenza B by PCR NEGATIVE NEGATIVE  Troponin I (High Sensitivity)     Status: Abnormal   Collection Time: 06/04/22 10:14 PM  Result Value Ref Range   Troponin I (High Sensitivity) 23 (H) <18 ng/L  Brain natriuretic peptide     Status: Abnormal   Collection Time: 06/04/22 10:14 PM  Result Value Ref Range   B Natriuretic Peptide 312.4 (H) 0.0 - 100.0 pg/mL  Hepatic function panel     Status: None   Collection Time: 06/04/22 10:14 PM  Result Value Ref Range   Total Protein 7.0 6.5 - 8.1 g/dL   Albumin 3.6 3.5 - 5.0 g/dL   AST 29 15 - 41 U/L   ALT 26 0 - 44 U/L   Alkaline Phosphatase 68 38 - 126 U/L   Total Bilirubin 0.6 0.3 - 1.2 mg/dL   Bilirubin, Direct 0.1 0.0 - 0.2 mg/dL   Indirect Bilirubin 0.5 0.3 - 0.9 mg/dL  Blood gas, venous     Status: Abnormal   Collection Time: 06/04/22 10:25 PM  Result Value Ref Range   FIO2 60 %   Delivery systems BILEVEL POSITIVE AIRWAY PRESSURE    pH, Ven 7.32 7.25 - 7.43   pCO2, Ven 61 (H) 44 - 60 mmHg   pO2, Ven 49 (H) 32 - 45 mmHg   Bicarbonate 31.4 (H) 20.0 - 28.0 mmol/L   Acid-Base Excess 3.6 (H) 0.0 - 2.0 mmol/L   O2 Saturation 81.3 %   Patient temperature 37.0    Collection site VEIN    Drawn by VENOUS   Urinalysis, Routine w reflex microscopic     Status: Abnormal   Collection Time: 06/04/22  10:25 PM  Result Value Ref Range   Color, Urine YELLOW (A) YELLOW   APPearance HAZY (A) CLEAR   Specific Gravity, Urine 1.016 1.005 - 1.030   pH 6.0 5.0 - 8.0   Glucose, UA NEGATIVE NEGATIVE mg/dL   Hgb urine dipstick MODERATE (A) NEGATIVE   Bilirubin Urine NEGATIVE NEGATIVE   Ketones, ur NEGATIVE NEGATIVE mg/dL   Protein, ur NEGATIVE NEGATIVE mg/dL   Nitrite NEGATIVE NEGATIVE   Leukocytes,Ua NEGATIVE NEGATIVE   RBC / HPF 21-50 0 - 5 RBC/hpf   WBC, UA 0-5 0 - 5 WBC/hpf   Bacteria, UA RARE (A) NONE SEEN   Squamous Epithelial / LPF NONE SEEN 0 - 5   Mucus PRESENT    In Ed pt received  Meds ordered this encounter  Medications   albuterol (PROVENTIL) (2.5 MG/3ML) 0.083% nebulizer solution 7.5 mg   LORazepam (ATIVAN) injection 1 mg   albuterol (PROVENTIL) (2.5 MG/3ML) 0.083% nebulizer solution 5 mg   azithromycin (ZITHROMAX) 500 mg in sodium chloride 0.9 % 250 mL IVPB    Order Specific Question:   Antibiotic Indication:    Answer:   Other Indication (list below)    Order Specific Question:   Other Indication:    Answer:   copd exacerbation   FOLLOWED BY Linked Order Group    methylPREDNISolone sodium succinate (SOLU-MEDROL) 125 mg/2 mL injection 60 mg     IV methylprednisolone will be converted to either a q12h or q24h frequency with the same total  daily dose (TDD).  Ordered Dose: 1 to 125 mg TDD; convert to: TDD q24h.  Ordered Dose: 126 to 250 mg TDD; convert to: TDD div q12h.  Ordered Dose: >250 mg TDD; DAW.    predniSONE (DELTASONE) tablet 40 mg   ipratropium-albuterol (DUONEB) 0.5-2.5 (3) MG/3ML nebulizer solution 3 mL   albuterol (PROVENTIL) (2.5 MG/3ML) 0.083% nebulizer solution 2.5 mg   sodium chloride flush (NS) 0.9 % injection 3 mL   DISCONTD: lactated ringers infusion   OR Linked Order Group    acetaminophen (TYLENOL) tablet 650 mg    acetaminophen (TYLENOL) suppository 650 mg   bisacodyl (DULCOLAX) EC tablet 5 mg   OR Linked Order Group    ondansetron (ZOFRAN)  tablet 4 mg    ondansetron (ZOFRAN) injection 4 mg   guaiFENesin (MUCINEX) 12 hr tablet 600 mg   nicotine (NICODERM CQ - dosed in mg/24 hours) patch 14 mg   hydrALAZINE (APRESOLINE) injection 5 mg   heparin injection 5,000 Units   LORazepam (ATIVAN) 2 MG/ML injection    Juleen China, McKenzie N: cabinet override   albuterol (PROVENTIL) (2.5 MG/3ML) 0.083% nebulizer solution    Estil Daft: cabinet override   DISCONTD: rocuronium bromide 100 MG/10ML [facility]: cabinet override   DISCONTD: etomidate (AMIDATE) 2 MG/ML injection    Dorene Ar: cabinet override   thiamine (VITAMIN B1) injection 100 mg   doxycycline (VIBRA-TABS) tablet 100 mg   pantoprazole (PROTONIX) injection 40 mg   furosemide (LASIX) injection 20 mg    Unresulted Labs (From admission, onward)    None        Admission Imaging : DG Chest Port 1 View  Result Date: 06/04/2022 CLINICAL DATA:  Shortness of breath. EXAM: PORTABLE CHEST 1 VIEW COMPARISON:  04/19/2022 FINDINGS: Heart size and pulmonary vascularity are normal. Diffuse interstitial pattern to the lungs with peribronchial thickening suggesting chronic bronchitic changes. Similar appearance to previous study. No developing consolidation or airspace disease. No pleural effusions. No pneumothorax. Mediastinal contours appear intact. IMPRESSION: Interstitial pattern to the lungs suggesting bronchitic changes. No focal consolidation. No interval change. Electronically Signed   By: Lucienne Capers M.D.   On: 06/04/2022 22:43      Physical Examination: Vitals:   06/04/22 2230 06/04/22 2300 06/04/22 2330 06/05/22 0000  BP: (!) 154/68 (!) 149/100 (!) 158/75 126/62  Pulse: 95 (!) 112 (!) 106 98  Temp:      Resp: (!) 29 (!) 33 (!) 31 (!) 29  SpO2: 98% 100% 98% 95%  TempSrc:       Physical Exam Vitals and nursing note reviewed.  Constitutional:      General: He is not in acute distress.    Appearance: Normal appearance. He is not  ill-appearing, toxic-appearing or diaphoretic.  HENT:     Head: Normocephalic and atraumatic.     Right Ear: Hearing and external ear normal.     Left Ear: Hearing and external ear normal.     Nose: Nose normal. No nasal deformity.     Mouth/Throat:     Lips: Pink.     Mouth: Mucous membranes are moist.     Tongue: No lesions.     Pharynx: Oropharynx is clear.  Eyes:     Extraocular Movements: Extraocular movements intact.     Pupils: Pupils are equal, round, and reactive to light.  Cardiovascular:     Rate and Rhythm: Regular rhythm. Tachycardia present.     Pulses: Normal pulses.  Heart sounds: Normal heart sounds.  Pulmonary:     Effort: Pulmonary effort is normal.     Breath sounds: Wheezing present.  Abdominal:     General: Bowel sounds are normal. There is no distension.     Palpations: Abdomen is soft. There is no mass.     Tenderness: There is no abdominal tenderness. There is no guarding.     Hernia: No hernia is present.  Musculoskeletal:     Right lower leg: No edema.     Left lower leg: No edema.  Skin:    General: Skin is warm.  Neurological:     General: No focal deficit present.     Mental Status: He is alert and oriented to person, place, and time.     Cranial Nerves: Cranial nerves 2-12 are intact.     Motor: Motor function is intact.  Psychiatric:        Attention and Perception: Attention normal.        Mood and Affect: Mood normal.        Speech: Speech normal.        Behavior: Behavior normal. Behavior is cooperative.        Cognition and Memory: Cognition normal.      Assessment and Plan: * COPD with acute exacerbation ([place]) Azithromycin,Patient coming and shortness of breath and found to be hypercapnic hypoxic on ABG and started on BiPAP therapy. Patient was initially on oxygen via nasal cannula. Patient started on steroid regimen and given multiple breathing treatments. On admit patient to progressive unit for continuous pulse ox and  cardiac follow-up. We will continue patient on Proventil, DuoNeb nebulizer. Marland Kitchen  Antitussives Tylenol and Mucinex. Continue patient on nicotine patch, continue patient on azithromycin,  Acute respiratory failure with hypoxia and hypercapnia (HCC) Initial ABG done today shows.  Latest Reference Range & Units 06/04/22 22:25  Delivery systems  BILEVEL POSITIVE AIRWAY PRESSURE  FIO2 % 60  pH, Ven 7.25 - 7.43  7.32  pCO2, Ven 44 - 60 mmHg 61 (H)  pO2, Ven 32 - 45 mmHg 49 (H)  Acid-Base Excess 0.0 - 2.0 mmol/L 3.6 (H)  Bicarbonate 20.0 - 28.0 mmol/L 31.4 (H)  O2 Saturation % 81.3  Patient temperature  37.0  Collection site  VEIN  High Patient continued on BiPAP therapy overnight. BNP is elevated at 312.4. If blood pressure allows will give low-dose diuretic therapy. Patient maintained on NIPPV therapy with BiPAP ventilation and oxygenation.  Atrial fibrillation with RVR ([place]) On physical exam patient is in sinus rhythm. EKG today shows sinus tachycardia at 110 with short PR interval and left atrial enlargement, ST depression in lateral leads see image below.   Benign prostatic hyperplasia Continue patient on tamsulosin. Patient currently does not report any urinary obstructive symptoms.    DVT prophylaxis:    Code Status:  Full Code    Family Communication:  Christifer, Chapdelaine (Spouse)  [phone] (Mobile    Disposition Plan:  Home    Consults called:  None   Admission status: Observation    Unit/ Expected LOS: Progressive    Para Skeans MD [facility]  6 PM- 2 AM. Please contact me via secure Chat 6 PM-2 AM. [phone] ( Pager ) To contact the [facility] Attending or Consulting provider [place] or covering provider during after hours [place], for this patient.   Check the care team in [facility] and look for a) attending/consulting [facility] provider listed and b) the [facility] team listed Log  into [email or web address] and use [place]'s universal password to access. If you do not  have the password, please contact the hospital operator. Locate the [facility] provider you are looking for under [facility] and page to a number that you can be directly reached. If you still have difficulty reaching the provider, please page the [facility] (Director on Call) for the Hospitalists listed on amion for assistance. [email or web address] 06/05/2022, 2:02 AM

## 2022-06-04 NOTE — Telephone Encounter (Signed)
Patient called to report possible side effects of Z-Pack and Prednisone. Patient says he has been having a higher pulse and feels winded. Patient is also having loss of bladder control, but no lower back pain or burning with urination. Per Alyssa, advised patient to stop Z-Pack and Prednisone to see if symptoms resolve. Also advised to go to the ED if symptoms worsen. Asked patient to please call back in 24 hours to update [place] on any progress after stopping medications.

## 2022-06-04 NOTE — ED Provider Notes (Signed)
[facility] Provider Note    Event Date/Time   First MD Initiated Contact with Patient 06/04/22 2207     (approximate)   History   Shortness of Breath (Patient C/O SOB)   HPI  EM caveat severe respiratory distress  Brian Key is a 76 y.o. male with a history of COPD, EMS reports patient was hypoxic with severe increased work of breathing wheezing responding well and improving with CPAP, 125 mg of Solu-Medrol, 2 g of magnesium and 2 DuoNebs given in route  Patient still smokes.  Advised that for the last day he started having increased shortness of breath wheezing" emphysema" as he describes it with wheezing.  No pain or discomfort anywhere.  Denies fevers.  Reports he has been through the same thing a few times     Physical Exam   Triage Vital Signs: ED Triage Vitals  Enc Vitals Group     BP 06/04/22 2209 (!) 147/110     Pulse Rate 06/04/22 2207 (!) 118     Resp 06/04/22 2207 (!) 27     Temp --      Temp src --      SpO2 06/04/22 2207 98 %     Weight --      Height --      Head Circumference --      Peak Flow --      Pain Score --      Pain Loc --      Pain Edu? --      Excl. in [place]? --     Most recent vital signs: Vitals:   06/04/22 2300 06/04/22 2330  BP: (!) 149/100 (!) 158/75  Pulse: (!) 112 (!) 106  Resp: (!) 33 (!) 31  Temp:    SpO2: 100% 98%     General: Awake, sitting upright, slight tripoding, obvious increased work of breathing but tolerating CPAP fairly well appears slightly anxious. CV:  Good peripheral perfusion.  Normal heart tones but tachycardic Resp:  Manage lung sounds with mild expiratory wheezing through all fields.  Appears tight.  Moderate accessory muscle use, and notable increased work of breathing able to speak 1-2 words at a time Abd:  No distention.  Soft nontender in all quadrants Other:  No lower extremity edema venous cords or congestion     ED Results / Procedures / Treatments   Labs (all  labs ordered are listed, but only abnormal results are displayed) Labs Reviewed  CBC - Abnormal; Notable for the following components:      Result Value   Hemoglobin 17.6 (*)    HCT 53.8 (*)    All other components within normal limits  BASIC METABOLIC PANEL - Abnormal; Notable for the following components:   Glucose, Bld 165 (*)    All other components within normal limits  BLOOD GAS, VENOUS - Abnormal; Notable for the following components:   pCO2, Ven 61 (*)    pO2, Ven 49 (*)    Bicarbonate 31.4 (*)    Acid-Base Excess 3.6 (*)    All other components within normal limits  URINALYSIS, ROUTINE W REFLEX MICROSCOPIC - Abnormal; Notable for the following components:   Color, Urine YELLOW (*)    APPearance HAZY (*)    Hgb urine dipstick MODERATE (*)    Bacteria, UA RARE (*)    All other components within normal limits  TROPONIN I (HIGH SENSITIVITY) - Abnormal; Notable for the following components:   Troponin  I (High Sensitivity) 23 (*)    All other components within normal limits  RESP PANEL BY RT-PCR (FLU A&B, COVID) [id number]     EKG  Interpreted by me at 2210 heart rate 110 QRS 90 QTc 420 Notable artifact due to work of breathing, but appears to demonstrate sinus tachycardia.  No obvious ST segment abnormalities or obvious frank ischemia though somewhat challenging to interpret in the setting of notable artifact   RADIOLOGY  X-ray interpreted as negative for acute infiltrate or pneumothorax, bronchitic changes are apparent   PROCEDURES:  Critical Care performed: Yes, see critical care procedure note(s)  Procedures   MEDICATIONS ORDERED IN ED: Medications  azithromycin (ZITHROMAX) 500 mg in sodium chloride 0.9 % 250 mL IVPB (has no administration in time range)  albuterol (PROVENTIL) (2.5 MG/3ML) 0.083% nebulizer solution 7.5 mg (7.5 mg Nebulization Given 06/04/22 2230)  LORazepam (ATIVAN) injection 1 mg (1 mg Intravenous Given 06/04/22 2224)  albuterol (PROVENTIL)  (2.5 MG/3ML) 0.083% nebulizer solution 5 mg (5 mg Nebulization Given 06/04/22 2315)     IMPRESSION / MDM / ASSESSMENT AND PLAN / ED COURSE  I reviewed the triage vital signs and the nursing notes.                              Differential diagnosis includes, but is not limited to, COPD exacerbation pneumonia pneumothorax,  Patient's presentation is most consistent with acute presentation with potential threat to life or bodily function.  The patient is on the cardiac monitor to evaluate for evidence of arrhythmia and/or significant heart rate changes.  No associated chest pain.  Patient has a well-established history of COPD, his clinical presentation treatment and response to treatment shows improvement and is consistent with the clinical history of COPD exacerbation.  I do not have high suspicion for acute thromboembolic phenomena or ACS at this point.   Labs notable for normal CBC except for elevated hemoglobin, normal metabolic panel, venous blood gas with PCO2 of 60 with normal pH at 7.3  No clear evidence to support pneumonia.  Viral studies negative for COVID and flu.  Patient improving with treatments resting comfortably tolerating BiPAP after receiving Ativan  ----------------------------------------- 11:47 PM on 06/04/2022 ----------------------------------------- Patient resting, doing well with BiPAP.  Good oxygenation work of breathing is improved and he is comfortable alert oriented and reports he feels much better.  Discussed case with hospitalist and will be admitted to the hospitalist service under Dr. Girard Cooter  FINAL CLINICAL IMPRESSION(S) / ED DIAGNOSES   Final diagnoses:  COPD exacerbation ([place])     Rx / DC Orders   ED Discharge Orders     None        Note:  This document was prepared using Dragon voice recognition software and may include unintentional dictation errors.   Delman Kitten, MD 06/04/22 2348

## 2022-06-05 ENCOUNTER — Inpatient Hospital Stay: Payer: Medicare HMO

## 2022-06-05 ENCOUNTER — Other Ambulatory Visit: Payer: Self-pay

## 2022-06-05 DIAGNOSIS — E43 Unspecified severe protein-calorie malnutrition: Secondary | ICD-10-CM | POA: Diagnosis not present

## 2022-06-05 DIAGNOSIS — F17210 Nicotine dependence, cigarettes, uncomplicated: Secondary | ICD-10-CM | POA: Diagnosis not present

## 2022-06-05 DIAGNOSIS — K5520 Angiodysplasia of colon without hemorrhage: Secondary | ICD-10-CM | POA: Diagnosis not present

## 2022-06-05 DIAGNOSIS — I4891 Unspecified atrial fibrillation: Secondary | ICD-10-CM | POA: Diagnosis not present

## 2022-06-05 DIAGNOSIS — E1151 Type 2 diabetes mellitus with diabetic peripheral angiopathy without gangrene: Secondary | ICD-10-CM | POA: Diagnosis not present

## 2022-06-05 DIAGNOSIS — D128 Benign neoplasm of rectum: Secondary | ICD-10-CM | POA: Diagnosis not present

## 2022-06-05 DIAGNOSIS — Z7951 Long term (current) use of inhaled steroids: Secondary | ICD-10-CM | POA: Diagnosis not present

## 2022-06-05 DIAGNOSIS — N3289 Other specified disorders of bladder: Secondary | ICD-10-CM | POA: Diagnosis not present

## 2022-06-05 DIAGNOSIS — Z791 Long term (current) use of non-steroidal anti-inflammatories (NSAID): Secondary | ICD-10-CM | POA: Diagnosis not present

## 2022-06-05 DIAGNOSIS — N400 Enlarged prostate without lower urinary tract symptoms: Secondary | ICD-10-CM | POA: Diagnosis not present

## 2022-06-05 DIAGNOSIS — K277 Chronic peptic ulcer, site unspecified, without hemorrhage or perforation: Secondary | ICD-10-CM | POA: Diagnosis not present

## 2022-06-05 DIAGNOSIS — K2900 Acute gastritis without bleeding: Secondary | ICD-10-CM | POA: Diagnosis not present

## 2022-06-05 DIAGNOSIS — E119 Type 2 diabetes mellitus without complications: Secondary | ICD-10-CM | POA: Diagnosis not present

## 2022-06-05 DIAGNOSIS — D62 Acute posthemorrhagic anemia: Secondary | ICD-10-CM | POA: Diagnosis not present

## 2022-06-05 DIAGNOSIS — K31819 Angiodysplasia of stomach and duodenum without bleeding: Secondary | ICD-10-CM | POA: Diagnosis not present

## 2022-06-05 DIAGNOSIS — K922 Gastrointestinal hemorrhage, unspecified: Secondary | ICD-10-CM | POA: Diagnosis not present

## 2022-06-05 DIAGNOSIS — J441 Chronic obstructive pulmonary disease with (acute) exacerbation: Secondary | ICD-10-CM | POA: Diagnosis not present

## 2022-06-05 DIAGNOSIS — R69 Illness, unspecified: Secondary | ICD-10-CM | POA: Diagnosis not present

## 2022-06-05 DIAGNOSIS — K529 Noninfective gastroenteritis and colitis, unspecified: Secondary | ICD-10-CM | POA: Diagnosis not present

## 2022-06-05 DIAGNOSIS — Z20822 Contact with and (suspected) exposure to covid-19: Secondary | ICD-10-CM | POA: Diagnosis not present

## 2022-06-05 DIAGNOSIS — K921 Melena: Secondary | ICD-10-CM | POA: Diagnosis not present

## 2022-06-05 DIAGNOSIS — I745 Embolism and thrombosis of iliac artery: Secondary | ICD-10-CM | POA: Diagnosis not present

## 2022-06-05 DIAGNOSIS — K2970 Gastritis, unspecified, without bleeding: Secondary | ICD-10-CM | POA: Diagnosis not present

## 2022-06-05 DIAGNOSIS — K269 Duodenal ulcer, unspecified as acute or chronic, without hemorrhage or perforation: Secondary | ICD-10-CM | POA: Diagnosis not present

## 2022-06-05 DIAGNOSIS — I7409 Other arterial embolism and thrombosis of abdominal aorta: Secondary | ICD-10-CM | POA: Diagnosis not present

## 2022-06-05 DIAGNOSIS — I119 Hypertensive heart disease without heart failure: Secondary | ICD-10-CM | POA: Diagnosis not present

## 2022-06-05 DIAGNOSIS — J9602 Acute respiratory failure with hypercapnia: Secondary | ICD-10-CM | POA: Diagnosis not present

## 2022-06-05 DIAGNOSIS — J449 Chronic obstructive pulmonary disease, unspecified: Secondary | ICD-10-CM | POA: Diagnosis not present

## 2022-06-05 DIAGNOSIS — J9601 Acute respiratory failure with hypoxia: Secondary | ICD-10-CM | POA: Diagnosis not present

## 2022-06-05 DIAGNOSIS — I513 Intracardiac thrombosis, not elsewhere classified: Secondary | ICD-10-CM | POA: Diagnosis not present

## 2022-06-05 DIAGNOSIS — I714 Abdominal aortic aneurysm, without rupture, unspecified: Secondary | ICD-10-CM | POA: Diagnosis not present

## 2022-06-05 DIAGNOSIS — R0603 Acute respiratory distress: Secondary | ICD-10-CM | POA: Diagnosis not present

## 2022-06-05 DIAGNOSIS — Z6821 Body mass index (BMI) 21.0-21.9, adult: Secondary | ICD-10-CM | POA: Diagnosis not present

## 2022-06-05 DIAGNOSIS — E785 Hyperlipidemia, unspecified: Secondary | ICD-10-CM | POA: Diagnosis not present

## 2022-06-05 DIAGNOSIS — Z5309 Procedure and treatment not carried out because of other contraindication: Secondary | ICD-10-CM | POA: Diagnosis not present

## 2022-06-05 DIAGNOSIS — F17200 Nicotine dependence, unspecified, uncomplicated: Secondary | ICD-10-CM

## 2022-06-05 DIAGNOSIS — Z79899 Other long term (current) drug therapy: Secondary | ICD-10-CM | POA: Diagnosis not present

## 2022-06-05 DIAGNOSIS — K621 Rectal polyp: Secondary | ICD-10-CM | POA: Diagnosis not present

## 2022-06-05 DIAGNOSIS — K920 Hematemesis: Secondary | ICD-10-CM | POA: Diagnosis not present

## 2022-06-05 DIAGNOSIS — K625 Hemorrhage of anus and rectum: Secondary | ICD-10-CM | POA: Diagnosis not present

## 2022-06-05 DIAGNOSIS — Z8249 Family history of ischemic heart disease and other diseases of the circulatory system: Secondary | ICD-10-CM | POA: Diagnosis not present

## 2022-06-05 LAB — HEPATIC FUNCTION PANEL
ALT: 26 U/L (ref 0–44)
AST: 29 U/L (ref 15–41)
Albumin: 3.6 g/dL (ref 3.5–5.0)
Alkaline Phosphatase: 68 U/L (ref 38–126)
Bilirubin, Direct: 0.1 mg/dL (ref 0.0–0.2)
Indirect Bilirubin: 0.5 mg/dL (ref 0.3–0.9)
Total Bilirubin: 0.6 mg/dL (ref 0.3–1.2)
Total Protein: 7 g/dL (ref 6.5–8.1)

## 2022-06-05 LAB — HEMOGLOBIN AND HEMATOCRIT, BLOOD
HCT: 35 % — ABNORMAL LOW (ref 39.0–52.0)
HCT: 35.1 % — ABNORMAL LOW (ref 39.0–52.0)
Hemoglobin: 11.7 g/dL — ABNORMAL LOW (ref 13.0–17.0)
Hemoglobin: 11.7 g/dL — ABNORMAL LOW (ref 13.0–17.0)

## 2022-06-05 LAB — GLUCOSE, CAPILLARY: Glucose-Capillary: 233 mg/dL — ABNORMAL HIGH (ref 70–99)

## 2022-06-05 LAB — TROPONIN I (HIGH SENSITIVITY)
Troponin I (High Sensitivity): 41 ng/L — ABNORMAL HIGH (ref <18)
Troponin I (High Sensitivity): 36 ng/L — ABNORMAL HIGH (ref <18)

## 2022-06-05 LAB — ABO/RH: ABO/RH(D): O POS

## 2022-06-05 LAB — BRAIN NATRIURETIC PEPTIDE: B Natriuretic Peptide: 312.4 pg/mL — ABNORMAL HIGH (ref 0.0–100.0)

## 2022-06-05 MED ORDER — NICOTINE 14 MG/24HR TD PT24
14.0000 mg | MEDICATED_PATCH | Freq: Every day | TRANSDERMAL | Status: DC
  Administered 2022-06-09 – 2022-06-11 (×3): 14 mg via TRANSDERMAL
  Filled 2022-06-05 (×7): qty 1

## 2022-06-05 MED ORDER — LACTATED RINGERS IV SOLN: INTRAVENOUS | Status: DC

## 2022-06-05 MED ORDER — SODIUM CHLORIDE 0.9 % IV BOLUS
500.0000 mL | Freq: Once | INTRAVENOUS | Status: AC
  Administered 2022-06-05: 500 mL via INTRAVENOUS

## 2022-06-05 MED ORDER — SODIUM CHLORIDE 0.9% IV SOLUTION: Freq: Once | INTRAVENOUS | Status: AC

## 2022-06-05 MED ORDER — DOXYCYCLINE HYCLATE 100 MG PO TABS
100.0000 mg | ORAL_TABLET | Freq: Two times a day (BID) | ORAL | Status: DC
  Administered 2022-06-05: 100 mg via ORAL
  Filled 2022-06-05 (×2): qty 1

## 2022-06-05 MED ORDER — ENOXAPARIN SODIUM 40 MG/0.4ML IJ SOSY
40.0000 mg | PREFILLED_SYRINGE | INTRAMUSCULAR | Status: DC
  Administered 2022-06-06 – 2022-06-08 (×2): 40 mg via SUBCUTANEOUS
  Filled 2022-06-05 (×2): qty 0.4

## 2022-06-05 MED ORDER — ACETAMINOPHEN 325 MG PO TABS: 650.0000 mg | ORAL_TABLET | Freq: Four times a day (QID) | ORAL | Status: DC | PRN

## 2022-06-05 MED ORDER — FUROSEMIDE 10 MG/ML IJ SOLN
20.0000 mg | Freq: Once | INTRAMUSCULAR | Status: AC
  Administered 2022-06-05: 20 mg via INTRAVENOUS
  Filled 2022-06-05: qty 4

## 2022-06-05 MED ORDER — METHYLPREDNISOLONE SODIUM SUCC 125 MG IJ SOLR
60.0000 mg | Freq: Two times a day (BID) | INTRAMUSCULAR | Status: AC
  Administered 2022-06-05: 60 mg via INTRAVENOUS
  Filled 2022-06-05: qty 2

## 2022-06-05 MED ORDER — PREDNISONE 20 MG PO TABS
40.0000 mg | ORAL_TABLET | Freq: Every day | ORAL | Status: AC
  Administered 2022-06-06 – 2022-06-09 (×3): 40 mg via ORAL
  Filled 2022-06-05 (×4): qty 2

## 2022-06-05 MED ORDER — ONDANSETRON HCL 4 MG PO TABS
4.0000 mg | ORAL_TABLET | Freq: Four times a day (QID) | ORAL | Status: DC | PRN
  Administered 2022-06-05: 4 mg via ORAL
  Filled 2022-06-05: qty 1

## 2022-06-05 MED ORDER — ONDANSETRON HCL 4 MG/2ML IJ SOLN: 4.0000 mg | Freq: Four times a day (QID) | INTRAMUSCULAR | Status: DC | PRN

## 2022-06-05 MED ORDER — ACETAMINOPHEN 650 MG RE SUPP: 650.0000 mg | Freq: Four times a day (QID) | RECTAL | Status: DC | PRN

## 2022-06-05 MED ORDER — BISACODYL 5 MG PO TBEC: 5.0000 mg | DELAYED_RELEASE_TABLET | Freq: Every day | ORAL | Status: DC | PRN

## 2022-06-05 MED ORDER — PANTOPRAZOLE SODIUM 40 MG IV SOLR
40.0000 mg | Freq: Two times a day (BID) | INTRAVENOUS | Status: DC
  Administered 2022-06-05 – 2022-06-11 (×12): 40 mg via INTRAVENOUS
  Filled 2022-06-05 (×14): qty 10

## 2022-06-05 MED ORDER — AZITHROMYCIN 250 MG PO TABS
500.0000 mg | ORAL_TABLET | Freq: Every day | ORAL | Status: AC
  Administered 2022-06-05 – 2022-06-08 (×3): 500 mg via ORAL
  Filled 2022-06-05 (×4): qty 2

## 2022-06-05 MED ORDER — IPRATROPIUM-ALBUTEROL 0.5-2.5 (3) MG/3ML IN SOLN
3.0000 mL | Freq: Four times a day (QID) | RESPIRATORY_TRACT | Status: DC
  Administered 2022-06-05 – 2022-06-06 (×6): 3 mL via RESPIRATORY_TRACT
  Filled 2022-06-05 (×6): qty 3

## 2022-06-05 MED ORDER — IOHEXOL 350 MG/ML SOLN
100.0000 mL | Freq: Once | INTRAVENOUS | Status: AC | PRN
  Administered 2022-06-05: 100 mL via INTRAVENOUS

## 2022-06-05 MED ORDER — HYDRALAZINE HCL 20 MG/ML IJ SOLN: 5.0000 mg | INTRAMUSCULAR | Status: DC | PRN

## 2022-06-05 MED ORDER — ALBUTEROL SULFATE (2.5 MG/3ML) 0.083% IN NEBU: 2.5000 mg | INHALATION_SOLUTION | RESPIRATORY_TRACT | Status: DC | PRN

## 2022-06-05 MED ORDER — HEPARIN SODIUM (PORCINE) 5000 UNIT/ML IJ SOLN
5000.0000 [IU] | Freq: Three times a day (TID) | INTRAMUSCULAR | Status: DC
  Administered 2022-06-05 (×2): 5000 [IU] via SUBCUTANEOUS
  Filled 2022-06-05 (×2): qty 1

## 2022-06-05 MED ORDER — GUAIFENESIN ER 600 MG PO TB12: 600.0000 mg | ORAL_TABLET | Freq: Two times a day (BID) | ORAL | Status: DC | PRN

## 2022-06-05 MED ORDER — SODIUM CHLORIDE 0.9% FLUSH
3.0000 mL | Freq: Two times a day (BID) | INTRAVENOUS | Status: DC
  Administered 2022-06-05 – 2022-06-11 (×10): 3 mL via INTRAVENOUS

## 2022-06-05 MED ORDER — THIAMINE HCL 100 MG/ML IJ SOLN
100.0000 mg | Freq: Every day | INTRAMUSCULAR | Status: DC
  Administered 2022-06-05 – 2022-06-10 (×5): 100 mg via INTRAVENOUS
  Filled 2022-06-05 (×7): qty 2

## 2022-06-05 NOTE — ED Notes (Signed)
Pt states no home O2, breathing e/u on room air while eating breakfast. MD aware.

## 2022-06-05 NOTE — Assessment & Plan Note (Signed)
--  sudden onset, BM with black stool and fresh blood, with associated abdominal pain.  CTA bleed scan didn't catch a source of bleeding, showed findings that suggest small bowel enteritis. Plan: --repeat colonoscopy today found no bleeding source --plan for EGD tomorrow --pain control

## 2022-06-05 NOTE — ED Notes (Signed)
Pt slightly short of breath, 2L [place], [place] place. Pt reports feeling "better" Saturation 93%.

## 2022-06-05 NOTE — ED Notes (Signed)
In pt room A&Ox4. Gave medications, tolerated well. Pt resting in bed watching television. Monitoring vitals.

## 2022-06-05 NOTE — Assessment & Plan Note (Signed)
--  Hgb dropped from 17 to 11 after first bloody BM. Plan: --Hgb BID --transfuse to keep Hgb >8

## 2022-06-05 NOTE — Progress Notes (Signed)
Rapid Response Event Note   Reason for Call : GI bleed   Initial Focused Assessment: On my arrival pt resting in bed, alert and oriented. Pt states he got up to bathroom, got very shaky, and had a bloody bowel movement. Pt's vital signs are stable at this time. Pt's family member at bedside.   Interventions: Pt's vitals stable. BP 98/73 with a MAP of 82. Dr. Billie Ruddy aware and at bedside. Hgb ordered. STAT CT ordered. 512m bolus ordered. GI will be consulted.    Plan of Care: Pt will remain on 2C at this time. No further needs from rapid response. Primary RN aware to reach out if any changes occur or if any needs arise.    Event Summary:   MD Notified: Dr. LBillie RuddyCall Time: [id number] Time: 12725End Time: 13664 MTrellis Paganini RN

## 2022-06-05 NOTE — Assessment & Plan Note (Signed)
--  extensive smoking hx, started around age 76 or 53, used to be 3 packs per day but now down to 1 pack per day --cessation encouraged

## 2022-06-05 NOTE — Assessment & Plan Note (Addendum)
Continue patient on tamsulosin.

## 2022-06-05 NOTE — Progress Notes (Signed)
Pt taken off bipap per nursing at this time and placed on [place]

## 2022-06-05 NOTE — ED Notes (Signed)
Informed RN bed assigned 

## 2022-06-05 NOTE — H&P (View-Only) (Signed)
[facility] GI Inpatient Consult Note   Brian Key, M.D.  Reason for Consult: Gastrointestinal bleeding   Attending Requesting Consult: Brian Bi, MD  History of Present Illness: Brian Key is a 76 y.o. male who was admitted yesterday evening with COPD exacerbation.  Earlier this afternoon, the patient went to have a bowel movement and described "a hard stool followed by a very loose diarrhea", and then a few moments later, "large amount of bright red blood".  Patient complained of some lower abdominal cramping just prior to the bowel movement but currently does not have any pain.  CTA has been ordered by Brian Key to evaluate lower GI bleeding in the acute setting. Patient denies any family history of colorectal cancer or polyps but has not previously undergone a colonoscopy or CT colonography for luminal evaluation of the colon for screening purposes.  Instead, the patient has undergone 2 or 3 Cologuard test in the last several years which were negative, per his recollection.  Patient says this is the first significant rectal bleeding episode he has experienced and has not had this previously. In general, patient reports normal bowel habits without any recent involuntary weight loss, anorexia or upper GI symptoms of nausea, vomiting or hematemesis.  He does not take prescription anticoagulation.  Past Medical History:  Past Medical History:  Diagnosis Date   Back problem    COPD (chronic obstructive pulmonary disease) ([place])    Diabetes mellitus without complication ([place])    Hyperlipidemia     Problem List: Patient Active Problem List   Diagnosis Date Noted   COPD with acute exacerbation ([place]) 06/05/2022   Acute respiratory failure with hypoxia and hypercapnia ([facility]) 06/05/2022   Respiratory distress 06/05/2022   Atrial fibrillation with RVR ([place]) 04/19/2022   Allergic reaction to bee sting 04/19/2022   Hypokalemia 04/19/2022   Leukocytosis 04/19/2022   Elevated  brain natriuretic peptide (BNP) level 04/19/2022   Prediabetes 02/07/2022   Benign prostatic hyperplasia 02/04/2022   Bilateral primary osteoarthritis of hip 02/04/2022   Muscle cramp 01/02/2020   Neuropathy 06/19/2019   Foot pain, bilateral 06/15/2019   Low back pain 06/15/2019   Bilateral hand numbness 06/12/2019   Neck pain 06/12/2019   Right arm pain 06/12/2019   Numbness and tingling 05/12/2019   Tingling 05/12/2019   Skin nodule 01/11/2018   Claudication ([place]) 03/18/2017   Lipoma of back 02/16/2017   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[place]) 03/05/2015    Past Surgical History: Past Surgical History:  Procedure Laterality Date   ANKLE SURGERY  2009   [place]   HERNIA REPAIR  0623   umbilical   LIPOMA EXCISION  02/16/2017   back/ Dr Brian Key   NECK SURGERY     WRIST SURGERY Left 2007    Allergies: Allergies  Allergen Reactions   Bee Pollen Anaphylaxis   Oxycontin [Oxycodone Hcl] Diarrhea    Nausea, severe GI upset   Penicillins Rash    Other reaction(s): Unknown    Home Medications: Medications Prior to Admission  Medication Sig Dispense Refill Last Dose   meloxicam (MOBIC) 15 MG tablet Take 1 tablet (15 mg total) by mouth daily. with food 90 tablet 1 06/04/2022   tamsulosin (FLOMAX) 0.4 MG CAPS capsule TAKE 1 CAPSULE BY MOUTH ONCE DAILY 30 MINUTES AFTER LARGEST MEAL. 90 capsule 3 06/04/2022   umeclidinium-vilanterol (ANORO ELLIPTA) 62.5-25 MCG/ACT AEPB Inhale 1 puff into the lungs daily. 180 each 1 06/04/2022   albuterol (VENTOLIN  HFA) 108 (90 Base) MCG/ACT inhaler Inhale 2 puffs into the lungs every 6 (six) hours as needed for wheezing or shortness of breath. 16 each 5 prn at prn   azithromycin (ZITHROMAX) 250 MG tablet Take one tab a day for 10 days for uri (Patient not taking: Reported on 06/04/2022) 10 tablet 0 Not Taking   EPINEPHrine (EPIPEN 2-PAK) 0.3 mg/0.3 mL IJ SOAJ injection Inject 0.3 mg into the muscle as needed for anaphylaxis. 2 each 2  prn at prn   ipratropium-albuterol (DUONEB) 0.5-2.5 (3) MG/3ML SOLN Take 3 mLs by nebulization every 4 (four) hours as needed (wheezing/SOB/cough). 360 mL 5 prn at prn   predniSONE (DELTASONE) 10 MG tablet Take one tab 3 x day for 3 days, then take one tab 2 x a day for 3 days and then take one tab a day for 3 days for copd (Patient not taking: Reported on 06/04/2022) 18 tablet 0 Not Taking   Home medication reconciliation was completed with the patient.   Scheduled Inpatient Medications:    doxycycline  100 mg Oral Q12H   heparin  5,000 Units Subcutaneous Q8H   ipratropium-albuterol  3 mL Nebulization Q6H   methylPREDNISolone (SOLU-MEDROL) injection  60 mg Intravenous Q12H   Followed by   Derrill Memo ON 06/06/2022] predniSONE  40 mg Oral Q breakfast   nicotine  14 mg Transdermal Daily   pantoprazole (PROTONIX) IV  40 mg Intravenous Q12H   sodium chloride flush  3 mL Intravenous Q12H   thiamine (VITAMIN B1) injection  100 mg Intravenous Daily    Continuous Inpatient Infusions:    PRN Inpatient Medications:  acetaminophen **OR** acetaminophen, albuterol, bisacodyl, guaiFENesin, hydrALAZINE, ondansetron **OR** ondansetron (ZOFRAN) IV  Family History: family history includes Asthma in his father; COPD in his father; Heart disease in his mother.   GI Family History: Negative as in the history of present illness.  Social History:   reports that he has been smoking cigarettes. He has a 50.00 pack-year smoking history. He has never used smokeless tobacco. He reports current alcohol use. He reports that he does not currently use drugs after having used the following drugs: Marijuana. The patient denies ETOH, tobacco, or drug use.    Review of Systems: Review of Systems - General ROS: positive for  - fatigue negative for - weight loss Psychological ROS: negative Ophthalmic ROS: negative ENT ROS: negative Allergy and Immunology ROS: negative Hematological and Lymphatic ROS:  negative Endocrine ROS: positive for - None Respiratory ROS: positive for - cough, shortness of breath, and tachypnea Cardiovascular ROS: no chest pain or dyspnea on exertion Genito-Urinary ROS: no dysuria, trouble voiding, or hematuria Musculoskeletal ROS: negative Neurological ROS: no TIA or stroke symptoms Dermatological ROS: negative  Physical Examination: BP 106/63   Pulse 89   Temp 98.2 F (36.8 C) (Oral)   Resp 19   SpO2 98%  Physical Exam Vitals reviewed.  Constitutional:      General: He is not in acute distress.    Appearance: He is ill-appearing. He is not toxic-appearing or diaphoretic.  HENT:     Mouth/Throat:     Pharynx: Oropharynx is clear.  Neck:     Thyroid: No thyromegaly.  Cardiovascular:     Rate and Rhythm: Normal rate. No extrasystoles are present.    Heart sounds: No murmur heard.    No gallop.  Pulmonary:     Effort: Respiratory distress present. No tachypnea or accessory muscle usage.     Breath sounds: No stridor.  Examination of the right-middle field reveals rhonchi. Examination of the left-middle field reveals rhonchi. Examination of the right-lower field reveals decreased breath sounds. Examination of the left-lower field reveals decreased breath sounds. Decreased breath sounds and rhonchi present. No wheezing or rales.  Chest:     Chest wall: No mass or deformity.  Abdominal:     General: Bowel sounds are normal.     Palpations: Abdomen is soft. There is no hepatomegaly.     Tenderness: There is no abdominal tenderness. There is no guarding or rebound.  Musculoskeletal:        General: Normal range of motion.  Skin:    General: Skin is warm and dry.     Capillary Refill: Capillary refill takes less than 2 seconds.  Neurological:     General: No focal deficit present.     Mental Status: He is alert.  Psychiatric:        Mood and Affect: Mood normal.     Data: Lab Results  Component Value Date   WBC 9.7 06/04/2022   HGB 17.6 (H)  06/04/2022   HCT 53.8 (H) 06/04/2022   MCV 96.6 06/04/2022   PLT 201 06/04/2022   Recent Labs  Lab 06/04/22 2214  HGB 17.6*   Lab Results  Component Value Date   NA 143 06/04/2022   K 4.2 06/04/2022   CL 103 06/04/2022   CO2 29 06/04/2022   BUN 22 06/04/2022   CREATININE 0.91 06/04/2022   Lab Results  Component Value Date   ALT 26 06/04/2022   AST 29 06/04/2022   ALKPHOS 68 06/04/2022   BILITOT 0.6 06/04/2022   No results for input(s): "APTT", "INR", "PTT" in the last 168 hours.    Latest Ref Rng & Units 06/04/2022   10:14 PM 04/20/2022    4:12 AM 04/19/2022   10:03 AM  CBC  WBC 4.0 - 10.5 K/uL 9.7  10.3  11.4   Hemoglobin 13.0 - 17.0 g/dL 17.6  16.6  17.3   Hematocrit 39.0 - 52.0 % 53.8  49.2  51.8   Platelets 150 - 400 K/uL 201  122  178     STUDIES: CT ANGIO GI BLEED  Result Date: 06/05/2022 CLINICAL DATA:  Bright red blood per rectum. EXAM: CTA ABDOMEN AND PELVIS WITHOUT AND WITH CONTRAST TECHNIQUE: Multidetector CT imaging of the abdomen and pelvis was performed using the standard protocol during bolus administration of intravenous contrast. Multiplanar reconstructed images and MIPs were obtained and reviewed to evaluate the vascular anatomy. RADIATION DOSE REDUCTION: This exam was performed according to the departmental dose-optimization program which includes automated exposure control, adjustment of the mA and/or kV according to patient size and/or use of iterative reconstruction technique. CONTRAST:  157m OMNIPAQUE IOHEXOL 350 MG/ML SOLN COMPARISON:  CT stone study 11/30/2014 FINDINGS: VASCULAR Aorta: Prominent diffuse aortic calcification. Aortic aneurysm measuring 3.3 cm diameter. Moderate mural thrombus. Lumen remains patent. Celiac: Calcification of the origin of the celiac axis with patency demonstrated. SMA: Calcification throughout the superior mesenteric artery with patency demonstrated. 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 Vessels appear patent and nephrograms are symmetrical. IMA: Origin appears occluded. Reconstitution of flow is demonstrated. Inflow: Severe calcifications demonstrated in the iliac arteries with probable moderate stenosis bilaterally. Proximal Outflow: Diffuse calcification.  Vessels remain patent. Veins: No obvious venous abnormality within the limitations of this arterial phase study. Review of the MIP images confirms the above findings. NON-VASCULAR Lower chest: No acute abnormality. Hepatobiliary:  No focal liver abnormality is seen. No gallstones, gallbladder wall thickening, or biliary dilatation. Pancreas: Unremarkable. No pancreatic ductal dilatation or surrounding inflammatory changes. Spleen: Normal in size without focal abnormality. Adrenals/Urinary Tract: Adrenal glands are unremarkable. Kidneys are normal, without renal calculi, focal lesion, or hydronephrosis. Bladder wall is diffusely thickened, possibly due to cystitis or under distention. Stomach/Bowel: Stomach, small bowel, and colon are not abnormally distended. No wall thickening or inflammatory changes are appreciated. Fluid-filled colon consistent with liquid stool. Small bowel wall appears mildly hyperemic diffusely but no focal areas of contrast extravasation are identified to suggest a focal site of bleeding. Changes may represent enteritis. Appendix is normal. Lymphatic: No significant lymphadenopathy. Reproductive: Prostate gland is enlarged. Other: No free air or free fluid in the abdomen. Abdominal wall musculature appears intact. Musculoskeletal: Degenerative changes in the spine. No destructive bone lesions. IMPRESSION: VASCULAR 1. 3.3 cm abdominal aortic aneurysm. Recommend follow-up every 3 years. Reference: J Am Coll Radiol 5009;38:[id number]. 2. Diffuse aortic atherosclerosis. Diffuse calcification of major abdominal vasculature. 3. No evidence of bowel obstruction. Liquid stool in the colon. Appendix is  normal. 4. Small bowel wall appears diffusely hyperemic but no focal areas of contrast extravasation are seen. No focal site of active bleeding is demonstrated. Changes may represent enteritis. 5. Bladder wall is thickened, possibly indicating cystitis or may be due to under distention. NON-VASCULAR Electronically Signed   By: Lucienne Capers M.D.   On: 06/05/2022 17:54   DG Chest Port 1 View  Result Date: 06/04/2022 CLINICAL DATA:  Shortness of breath. EXAM: PORTABLE CHEST 1 VIEW COMPARISON:  04/19/2022 FINDINGS: Heart size and pulmonary vascularity are normal. Diffuse interstitial pattern to the lungs with peribronchial thickening suggesting chronic bronchitic changes. Similar appearance to previous study. No developing consolidation or airspace disease. No pleural effusions. No pneumothorax. Mediastinal contours appear intact. IMPRESSION: Interstitial pattern to the lungs suggesting bronchitic changes. No focal consolidation. No interval change. Electronically Signed   By: Lucienne Capers M.D.   On: 06/04/2022 22:43   '@IMAGES'$ @  Assessment:  Acute Lower gastrointestinal bleeding - Appears historically consistent with ischemic colitis. Other possible differential diagnoses include infectious colitis, diverticular bleeding, anal outlet bleed (hemorrhoids, fissures), colorectal malignancy, low likelihood of chronic colitis such as IBD given acute presentation. COPD exacerbation. Mildly improved. Type II DM. Cologuard negative c. 2022.  COVID-19 status:     Tested negative     Recommendations:  Agree with CTA as ordered to rule out ongoing bleed. Clear liquid diet. Serial H/H. Further recommendations will be made based upon clinical course and result of CTA and serial examinations. Will follow closely along.  Thank you for the consult. Please call with questions or concerns.  Olean Ree, "Lanny Hurst MD [facility] [place], [place] [address] [phone]  06/05/2022 6:14 PM

## 2022-06-05 NOTE — Consult Note (Signed)
[facility] GI Inpatient Consult Note   [name], M.D.  Reason for Consult: Gastrointestinal bleeding   Attending Requesting Consult: Enzo Bi, MD  History of Present Illness: Brian Key is a 76 y.o. male who was admitted yesterday evening with COPD exacerbation.  Earlier this afternoon, the patient went to have a bowel movement and described "a hard stool followed by a very loose diarrhea", and then a few moments later, "large amount of bright red blood".  Patient complained of some lower abdominal cramping just prior to the bowel movement but currently does not have any pain.  CTA has been ordered by Dr. Billie Ruddy to evaluate lower GI bleeding in the acute setting. Patient denies any family history of colorectal cancer or polyps but has not previously undergone a colonoscopy or CT colonography for luminal evaluation of the colon for screening purposes.  Instead, the patient has undergone 2 or 3 Cologuard test in the last several years which were negative, per his recollection.  Patient says this is the first significant rectal bleeding episode he has experienced and has not had this previously. In general, patient reports normal bowel habits without any recent involuntary weight loss, anorexia or upper GI symptoms of nausea, vomiting or hematemesis.  He does not take prescription anticoagulation.  Past Medical History:  Past Medical History:  Diagnosis Date   Back problem    COPD (chronic obstructive pulmonary disease) ([place])    Diabetes mellitus without complication ([place])    Hyperlipidemia     Problem List: Patient Active Problem List   Diagnosis Date Noted   COPD with acute exacerbation ([place]) 06/05/2022   Acute respiratory failure with hypoxia and hypercapnia (HCC) 06/05/2022   Respiratory distress 06/05/2022   Atrial fibrillation with RVR ([place]) 04/19/2022   Allergic reaction to bee sting 04/19/2022   Hypokalemia 04/19/2022   Leukocytosis 04/19/2022   Elevated  brain natriuretic peptide (BNP) level 04/19/2022   Prediabetes 02/07/2022   Benign prostatic hyperplasia 02/04/2022   Bilateral primary osteoarthritis of hip 02/04/2022   Muscle cramp 01/02/2020   Neuropathy 06/19/2019   Foot pain, bilateral 06/15/2019   Low back pain 06/15/2019   Bilateral hand numbness 06/12/2019   Neck pain 06/12/2019   Right arm pain 06/12/2019   Numbness and tingling 05/12/2019   Tingling 05/12/2019   Skin nodule 01/11/2018   Claudication ([place]) 03/18/2017   Lipoma of back 02/16/2017   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[place]) 03/05/2015    Past Surgical History: Past Surgical History:  Procedure Laterality Date   ANKLE SURGERY  2009   [place]   HERNIA REPAIR  [id number]   umbilical   LIPOMA EXCISION  02/16/2017   back/ Dr Bary Castilla   NECK SURGERY     WRIST SURGERY Left 2007    Allergies: Allergies  Allergen Reactions   Bee Pollen Anaphylaxis   Oxycontin [Oxycodone Hcl] Diarrhea    Nausea, severe GI upset   Penicillins Rash    Other reaction(s): Unknown    Home Medications: Medications Prior to Admission  Medication Sig Dispense Refill Last Dose   meloxicam (MOBIC) 15 MG tablet Take 1 tablet (15 mg total) by mouth daily. with food 90 tablet 1 06/04/2022   tamsulosin (FLOMAX) 0.4 MG CAPS capsule TAKE 1 CAPSULE BY MOUTH ONCE DAILY 30 MINUTES AFTER LARGEST MEAL. 90 capsule 3 06/04/2022   umeclidinium-vilanterol (ANORO ELLIPTA) 62.5-25 MCG/ACT AEPB Inhale 1 puff into the lungs daily. 180 each 1 06/04/2022   albuterol (VENTOLIN  HFA) 108 (90 Base) MCG/ACT inhaler Inhale 2 puffs into the lungs every 6 (six) hours as needed for wheezing or shortness of breath. 16 each 5 prn at prn   azithromycin (ZITHROMAX) 250 MG tablet Take one tab a day for 10 days for uri (Patient not taking: Reported on 06/04/2022) 10 tablet 0 Not Taking   EPINEPHrine (EPIPEN 2-PAK) 0.3 mg/0.3 mL IJ SOAJ injection Inject 0.3 mg into the muscle as needed for anaphylaxis. 2 each 2  prn at prn   ipratropium-albuterol (DUONEB) 0.5-2.5 (3) MG/3ML SOLN Take 3 mLs by nebulization every 4 (four) hours as needed (wheezing/SOB/cough). 360 mL 5 prn at prn   predniSONE (DELTASONE) 10 MG tablet Take one tab 3 x day for 3 days, then take one tab 2 x a day for 3 days and then take one tab a day for 3 days for copd (Patient not taking: Reported on 06/04/2022) 18 tablet 0 Not Taking   Home medication reconciliation was completed with the patient.   Scheduled Inpatient Medications:    doxycycline  100 mg Oral Q12H   heparin  5,000 Units Subcutaneous Q8H   ipratropium-albuterol  3 mL Nebulization Q6H   methylPREDNISolone (SOLU-MEDROL) injection  60 mg Intravenous Q12H   Followed by   Derrill Memo ON 06/06/2022] predniSONE  40 mg Oral Q breakfast   nicotine  14 mg Transdermal Daily   pantoprazole (PROTONIX) IV  40 mg Intravenous Q12H   sodium chloride flush  3 mL Intravenous Q12H   thiamine (VITAMIN B1) injection  100 mg Intravenous Daily    Continuous Inpatient Infusions:    PRN Inpatient Medications:  acetaminophen **OR** acetaminophen, albuterol, bisacodyl, guaiFENesin, hydrALAZINE, ondansetron **OR** ondansetron (ZOFRAN) IV  Family History: family history includes Asthma in his father; COPD in his father; Heart disease in his mother.   GI Family History: Negative as in the history of present illness.  Social History:   reports that he has been smoking cigarettes. He has a 50.00 pack-year smoking history. He has never used smokeless tobacco. He reports current alcohol use. He reports that he does not currently use drugs after having used the following drugs: Marijuana. The patient denies ETOH, tobacco, or drug use.    Review of Systems: Review of Systems - General ROS: positive for  - fatigue negative for - weight loss Psychological ROS: negative Ophthalmic ROS: negative ENT ROS: negative Allergy and Immunology ROS: negative Hematological and Lymphatic ROS:  negative Endocrine ROS: positive for - None Respiratory ROS: positive for - cough, shortness of breath, and tachypnea Cardiovascular ROS: no chest pain or dyspnea on exertion Genito-Urinary ROS: no dysuria, trouble voiding, or hematuria Musculoskeletal ROS: negative Neurological ROS: no TIA or stroke symptoms Dermatological ROS: negative  Physical Examination: BP 106/63   Pulse 89   Temp 98.2 F (36.8 C) (Oral)   Resp 19   SpO2 98%  Physical Exam Vitals reviewed.  Constitutional:      General: He is not in acute distress.    Appearance: He is ill-appearing. He is not toxic-appearing or diaphoretic.  HENT:     Mouth/Throat:     Pharynx: Oropharynx is clear.  Neck:     Thyroid: No thyromegaly.  Cardiovascular:     Rate and Rhythm: Normal rate. No extrasystoles are present.    Heart sounds: No murmur heard.    No gallop.  Pulmonary:     Effort: Respiratory distress present. No tachypnea or accessory muscle usage.     Breath sounds: No stridor.  Examination of the right-middle field reveals rhonchi. Examination of the left-middle field reveals rhonchi. Examination of the right-lower field reveals decreased breath sounds. Examination of the left-lower field reveals decreased breath sounds. Decreased breath sounds and rhonchi present. No wheezing or rales.  Chest:     Chest wall: No mass or deformity.  Abdominal:     General: Bowel sounds are normal.     Palpations: Abdomen is soft. There is no hepatomegaly.     Tenderness: There is no abdominal tenderness. There is no guarding or rebound.  Musculoskeletal:        General: Normal range of motion.  Skin:    General: Skin is warm and dry.     Capillary Refill: Capillary refill takes less than 2 seconds.  Neurological:     General: No focal deficit present.     Mental Status: He is alert.  Psychiatric:        Mood and Affect: Mood normal.     Data: Lab Results  Component Value Date   WBC 9.7 06/04/2022   HGB 17.6 (H)  06/04/2022   HCT 53.8 (H) 06/04/2022   MCV 96.6 06/04/2022   PLT 201 06/04/2022   Recent Labs  Lab 06/04/22 2214  HGB 17.6*   Lab Results  Component Value Date   NA 143 06/04/2022   K 4.2 06/04/2022   CL 103 06/04/2022   CO2 29 06/04/2022   BUN 22 06/04/2022   CREATININE 0.91 06/04/2022   Lab Results  Component Value Date   ALT 26 06/04/2022   AST 29 06/04/2022   ALKPHOS 68 06/04/2022   BILITOT 0.6 06/04/2022   No results for input(s): "APTT", "INR", "PTT" in the last 168 hours.    Latest Ref Rng & Units 06/04/2022   10:14 PM 04/20/2022    4:12 AM 04/19/2022   10:03 AM  CBC  WBC 4.0 - 10.5 K/uL 9.7  10.3  11.4   Hemoglobin 13.0 - 17.0 g/dL 17.6  16.6  17.3   Hematocrit 39.0 - 52.0 % 53.8  49.2  51.8   Platelets 150 - 400 K/uL 201  122  178     STUDIES: CT ANGIO GI BLEED  Result Date: 06/05/2022 CLINICAL DATA:  Bright red blood per rectum. EXAM: CTA ABDOMEN AND PELVIS WITHOUT AND WITH CONTRAST TECHNIQUE: Multidetector CT imaging of the abdomen and pelvis was performed using the standard protocol during bolus administration of intravenous contrast. Multiplanar reconstructed images and MIPs were obtained and reviewed to evaluate the vascular anatomy. RADIATION DOSE REDUCTION: This exam was performed according to the departmental dose-optimization program which includes automated exposure control, adjustment of the mA and/or kV according to patient size and/or use of iterative reconstruction technique. CONTRAST:  152m OMNIPAQUE IOHEXOL 350 MG/ML SOLN COMPARISON:  CT stone study 11/30/2014 FINDINGS: VASCULAR Aorta: Prominent diffuse aortic calcification. Aortic aneurysm measuring 3.3 cm diameter. Moderate mural thrombus. Lumen remains patent. Celiac: Calcification of the origin of the celiac axis with patency demonstrated. SMA: Calcification throughout the superior mesenteric artery with patency demonstrated. 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 Vessels appear patent and nephrograms are symmetrical. IMA: Origin appears occluded. Reconstitution of flow is demonstrated. Inflow: Severe calcifications demonstrated in the iliac arteries with probable moderate stenosis bilaterally. Proximal Outflow: Diffuse calcification.  Vessels remain patent. Veins: No obvious venous abnormality within the limitations of this arterial phase study. Review of the MIP images confirms the above findings. NON-VASCULAR Lower chest: No acute abnormality. Hepatobiliary:  No focal liver abnormality is seen. No gallstones, gallbladder wall thickening, or biliary dilatation. Pancreas: Unremarkable. No pancreatic ductal dilatation or surrounding inflammatory changes. Spleen: Normal in size without focal abnormality. Adrenals/Urinary Tract: Adrenal glands are unremarkable. Kidneys are normal, without renal calculi, focal lesion, or hydronephrosis. Bladder wall is diffusely thickened, possibly due to cystitis or under distention. Stomach/Bowel: Stomach, small bowel, and colon are not abnormally distended. No wall thickening or inflammatory changes are appreciated. Fluid-filled colon consistent with liquid stool. Small bowel wall appears mildly hyperemic diffusely but no focal areas of contrast extravasation are identified to suggest a focal site of bleeding. Changes may represent enteritis. Appendix is normal. Lymphatic: No significant lymphadenopathy. Reproductive: Prostate gland is enlarged. Other: No free air or free fluid in the abdomen. Abdominal wall musculature appears intact. Musculoskeletal: Degenerative changes in the spine. No destructive bone lesions. IMPRESSION: VASCULAR 1. 3.3 cm abdominal aortic aneurysm. Recommend follow-up every 3 years. Reference: J Am Coll Radiol 6734;19:379-024. 2. Diffuse aortic atherosclerosis. Diffuse calcification of major abdominal vasculature. 3. No evidence of bowel obstruction. Liquid stool in the colon. Appendix is  normal. 4. Small bowel wall appears diffusely hyperemic but no focal areas of contrast extravasation are seen. No focal site of active bleeding is demonstrated. Changes may represent enteritis. 5. Bladder wall is thickened, possibly indicating cystitis or may be due to under distention. NON-VASCULAR Electronically Signed   By: Lucienne Capers M.D.   On: 06/05/2022 17:54   DG Chest Port 1 View  Result Date: 06/04/2022 CLINICAL DATA:  Shortness of breath. EXAM: PORTABLE CHEST 1 VIEW COMPARISON:  04/19/2022 FINDINGS: Heart size and pulmonary vascularity are normal. Diffuse interstitial pattern to the lungs with peribronchial thickening suggesting chronic bronchitic changes. Similar appearance to previous study. No developing consolidation or airspace disease. No pleural effusions. No pneumothorax. Mediastinal contours appear intact. IMPRESSION: Interstitial pattern to the lungs suggesting bronchitic changes. No focal consolidation. No interval change. Electronically Signed   By: Lucienne Capers M.D.   On: 06/04/2022 22:43   '@IMAGES'$ @  Assessment:  Acute Lower gastrointestinal bleeding - Appears historically consistent with ischemic colitis. Other possible differential diagnoses include infectious colitis, diverticular bleeding, anal outlet bleed (hemorrhoids, fissures), colorectal malignancy, low likelihood of chronic colitis such as IBD given acute presentation. COPD exacerbation. Mildly improved. Type II DM. Cologuard negative c. 2022.  COVID-19 status:     Tested negative     Recommendations:  Agree with CTA as ordered to rule out ongoing bleed. Clear liquid diet. Serial H/H. Further recommendations will be made based upon clinical course and result of CTA and serial examinations. Will follow closely along.  Thank you for the consult. Please call with questions or concerns.  Olean Ree, "Lanny Hurst MD [facility] [place], [place] [address] [phone]  06/05/2022 6:14 PM

## 2022-06-05 NOTE — Assessment & Plan Note (Signed)
On physical exam patient is in sinus rhythm. EKG today shows sinus tachycardia at 110 with short PR interval and left atrial enlargement, ST depression in lateral leads see image below.

## 2022-06-05 NOTE — Assessment & Plan Note (Addendum)
Initial ABG done today shows.  Latest Reference Range & Units 06/04/22 22:25  Delivery systems  BILEVEL POSITIVE AIRWAY PRESSURE  FIO2 % 60  pH, Ven 7.25 - 7.43  7.32  pCO2, Ven 44 - 60 mmHg 61 (H)  pO2, Ven 32 - 45 mmHg 49 (H)  Acid-Base Excess 0.0 - 2.0 mmol/L 3.6 (H)  Bicarbonate 20.0 - 28.0 mmol/L 31.4 (H)  O2 Saturation % 81.3  Patient temperature  37.0  Collection site  VEIN  High Patient continued on BiPAP therapy overnight. BNP is elevated at 312.4. If blood pressure allows will give low-dose diuretic therapy. Patient maintained on NIPPV therapy with BiPAP ventilation and oxygenation.

## 2022-06-05 NOTE — Progress Notes (Signed)
  PROGRESS NOTE    Brian Key  OVF:[id number] DOB: 1946-02-12 DOA: 06/04/2022 PCP: Jonetta Osgood, NP  [id number]  LOS: 0 days   Brief hospital course:   Assessment & Plan: Brian Key is a 76 y.o.  male with medical history significant for COPD, extensive smoking hx who presented with severe respiratory distress.     * COPD with acute exacerbation (HCC) Azithromycin,Patient coming and shortness of breath and found to be hypercapnic hypoxic on ABG and started on BiPAP therapy. Patient was initially on oxygen via nasal cannula. Patient started on steroid regimen and given multiple breathing treatments. On admit patient to progressive unit for continuous pulse ox and cardiac follow-up. We will continue patient on Proventil, DuoNeb nebulizer. Marland Kitchen  Antitussives Tylenol and Mucinex. Continue patient on nicotine patch, continue patient on azithromycin,  Acute respiratory failure with hypoxia and hypercapnia (HCC) Initial ABG done today shows.  Latest Reference Range & Units 06/04/22 22:25  Delivery systems  BILEVEL POSITIVE AIRWAY PRESSURE  FIO2 % 60  pH, Ven 7.25 - 7.43  7.32  pCO2, Ven 44 - 60 mmHg 61 (H)  pO2, Ven 32 - 45 mmHg 49 (H)  Acid-Base Excess 0.0 - 2.0 mmol/L 3.6 (H)  Bicarbonate 20.0 - 28.0 mmol/L 31.4 (H)  O2 Saturation % 81.3  Patient temperature  37.0  Collection site  VEIN  High Patient continued on BiPAP therapy overnight. BNP is elevated at 312.4. If blood pressure allows will give low-dose diuretic therapy. Patient maintained on NIPPV therapy with BiPAP ventilation and oxygenation.  Atrial fibrillation with RVR ([place]) On physical exam patient is in sinus rhythm. EKG today shows sinus tachycardia at 110 with short PR interval and left atrial enlargement, ST depression in lateral leads see image below.   Benign prostatic hyperplasia Continue patient on tamsulosin. Patient currently does not report any urinary obstructive symptoms.   DVT  prophylaxis: Lovenox SQ Code Status: Full code  Family Communication: wife updated at bedside today Level of care: Med-Surg Dispo:   The patient is from: home Anticipated d/c is to: home Anticipated d/c date is: 2-3 days   Subjective and Interval History:  This morning, pt reported breathing much better.  Has some cough.  Later in the afternoon, pt suddenly had a bloody BM with abdominal pain.  Hgb dropped from 17 to 11.  Vitals stable.  GI consulted.   Objective: Vitals:   06/05/22 1555 06/05/22 1657 06/05/22 1659 06/05/22 1800  BP: 101/76 98/73 109/69 106/63  Pulse: (!) 108 87 90 89  Resp: 19     Temp: 98.2 F (36.8 C)     TempSrc: Oral     SpO2: 97% 99% 98%     Intake/Output Summary (Last 24 hours) at 06/05/2022 1903 Last data filed at 06/05/2022 1806 Gross per 24 hour  Intake 374.67 ml  Output 550 ml  Net -175.33 ml   There were no vitals filed for this visit.  Examination:   Constitutional: NAD, AAOx3 HEENT: conjunctivae and lids normal, EOMI CV: No cyanosis.   RESP: normal respiratory effort, very reduced lung sounds, no wheezes, on 2L Extremities: No effusions, edema in BLE SKIN: warm, dry Neuro: II - XII grossly intact.   Psych: Normal mood and affect.  Appropriate judgement and reason   Data Reviewed: I have personally reviewed labs and imaging studies  Time spent: 60 minutes  Enzo Bi, MD [facility] If 7PM-7AM, please contact night-coverage 06/05/2022, 7:03 PM

## 2022-06-05 NOTE — Assessment & Plan Note (Addendum)
--  presented with severe respiratory distress, was put on BiPAP.  Received 125 mg of Solu-Medrol, 2 g of magnesium and 2 DuoNebs on route. --CXR no acute finding.  No evidence of bacterial PNA.  Had flu-like symptoms PTA. --respiratory status much improved the next day, and on [place] Plan: --transition to prednisone 40 mg daily --cont scheduled DuoNeb --cont azithromycin

## 2022-06-05 NOTE — Progress Notes (Signed)
Patient was having a Bowel movement at 1700 and he called out " Help" to the nurse station. Patient had large amount of Bright red blood coming from his rectum with clots present during bowel movement. Patient expressed he was feeling lightheaded and began to sweat. RN paged Enzo Bi, RN TO the bedside. MD appeared at bedside and placed new orders. Patient Vital signs remained stable throughout process( see flowsheet) . RRT was called to draw stat labs and get a STAT CT. RRT team was present and instructed nurse to call if vitals are no longer stable. Patient is resting and stable at this time. RN following new orders placed.

## 2022-06-05 NOTE — Evaluation (Signed)
Physical Therapy Evaluation Patient Details Name: Brian Key MRN: [id number] DOB: [birth year] Today's Date: 06/05/2022  History of Present Illness  Brian Key is an 76 y.o. male presenting to the emergency room with shortness of breath that is been going on for the past few days.   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   The patient performed bed mobility modI, good sitting balance noted. Sit <> Stand with modI (use of hands) no DME. He ambulated in the room ~62f with supervision. He was able to navigate obstacles and pick multiple items up off the floor without LOB. Increased RR noted but on room air spO2 ranged from 90-96%, RN notified and pt left on room air. Overall the patient seemed appropriate for discharge home, after discussion with PT, declining HHPT. Stated as long as his breathing is better he will get back to his normal and does his own workouts. PT to sign off at this time, please re-consult if any acute needs arise. Mobility referral placed to assist with continued mobility during acute hospitalization.        Recommendations for follow up therapy are one component of a multi-disciplinary discharge planning process, led by the attending physician.  Recommendations may be updated based on patient status, additional functional criteria and insurance authorization.  Follow Up Recommendations No PT follow up      Assistance Recommended at Discharge Intermittent Supervision/Assistance  Patient can return home with the following  Help with stairs or ramp for entrance;Assist for transportation    Equipment Recommendations None recommended by PT  Recommendations for Other Services       Functional Status Assessment Patient has had a recent decline in their functional status and demonstrates the ability to make significant improvements in function in a reasonable and predictable  amount of time.     Precautions / Restrictions Precautions Precautions: Fall Restrictions Weight Bearing Restrictions: No      Mobility  Bed Mobility Overal bed mobility: Modified Independent                  Transfers Overall transfer level: Modified independent Equipment used: None Transfers: Sit to/from Stand             General transfer comment: single UE support    Ambulation/Gait Ambulation/Gait assistance: Supervision Gait Distance (Feet): 60 Feet           General Gait Details: pt ambulating around room, reaches to ground to pick items up, navigates obstacles well  Stairs            Wheelchair Mobility    Modified Rankin (Stroke Patients Only)       Balance Overall balance assessment: Mild deficits observed, not formally tested Sitting-balance support: No upper extremity supported, Feet supported Sitting balance-Leahy Scale: Good     Standing balance support: No upper extremity supported, During functional activity Standing balance-Leahy Scale: Good                               Pertinent Vitals/Pain Pain Assessment Pain Assessment: No/denies pain    Home Living Family/patient expects to be discharged to:: Private residence Living Arrangements: Spouse/significant other Available Help at Discharge: Family Type of Home: House Home Access: Stairs to enter Entrance Stairs-Rails: RPsychiatric nurseof Steps: 5   Home Layout: One level        Prior Function  Prior Level of Function : Independent/Modified Independent;Working/employed             Mobility Comments: heavy Psychologist, educational        Extremity/Trunk Assessment   Upper Extremity Assessment Upper Extremity Assessment: Defer to OT evaluation    Lower Extremity Assessment Lower Extremity Assessment: Generalized weakness       Communication   Communication: No difficulties  Cognition  Arousal/Alertness: Awake/alert Behavior During Therapy: WFL for tasks assessed/performed Overall Cognitive Status: Within Functional Limits for tasks assessed                                          General Comments General comments (skin integrity, edema, etc.): SpO2 87-93% on RA during mobility    Exercises     Assessment/Plan    PT Assessment Patient does not need any further PT services  PT Problem List         PT Treatment Interventions      PT Goals (Current goals can be found in the Care Plan section)       Frequency       Co-evaluation               AM-PAC PT "6 Clicks" Mobility  Outcome Measure Help needed turning from your back to your side while in a flat bed without using bedrails?: None Help needed moving from lying on your back to sitting on the side of a flat bed without using bedrails?: None Help needed moving to and from a bed to a chair (including a wheelchair)?: None Help needed standing up from a chair using your arms (e.g., wheelchair or bedside chair)?: None Help needed to walk in hospital room?: None Help needed climbing 3-5 steps with a railing? : None 6 Click Score: 24    End of Session   Activity Tolerance: Patient tolerated treatment well Patient left: in bed;with call bell/phone within reach Nurse Communication: Mobility status PT Visit Diagnosis: Difficulty in walking, not elsewhere classified (R26.2)    Time: 6270-3500 PT Time Calculation (min) (ACUTE ONLY): 25 min   Charges:   PT Evaluation $PT Eval Low Complexity: 1 Low PT Treatments $Therapeutic Activity: 8-22 mins        Lieutenant Diego PT, DPT 2:42 PM,06/05/22

## 2022-06-05 NOTE — Evaluation (Signed)
Occupational Therapy Evaluation Patient Details Name: Brian Key MRN: [id number] DOB: 01-Aug-1946 Today's Date: 06/05/2022   History of Present Illness Brian Key is an 76 y.o. male presenting to the emergency room with shortness of breath that is been going on for the past few days.   Clinical Impression   Brian Key was seen for OT evaluation this date. Prior to hospital admission, pt was IND including working full time. Pt lives with spouse and 2 large dogs. Pt presents to acute OT demonstrating impaired ADL performance 2/2 decreased activity tolerance and BUE tremors. Pt currently requires SUPERVISION don B socks seated EOB. CGA for functional mobility ~100 ft, SpO2 87% on RA, improves with standing rest break to 90%. Fair static balance. Educated on falls prevention, all education complete, will sign off. Upon hospital discharge, recommend no OT follow up.   Recommendations for follow up therapy are one component of a multi-disciplinary discharge planning process, led by the attending physician.  Recommendations may be updated based on patient status, additional functional criteria and insurance authorization.   Follow Up Recommendations  No OT follow up    Assistance Recommended at Discharge Set up Supervision/Assistance  Patient can return home with the following A little help with walking and/or transfers;A little help with bathing/dressing/bathroom    Functional Status Assessment  Patient has had a recent decline in their functional status and demonstrates the ability to make significant improvements in function in a reasonable and predictable amount of time.  Equipment Recommendations  None recommended by OT    Recommendations for Other Services       Precautions / Restrictions Precautions Precautions: Fall Restrictions Weight Bearing Restrictions: No      Mobility Bed Mobility Overal bed mobility: Modified Independent                   Transfers Overall transfer level: Needs assistance Equipment used: None Transfers: Sit to/from Stand Sit to Stand: Min guard                  Balance Overall balance assessment: Needs assistance Sitting-balance support: No upper extremity supported, Feet supported Sitting balance-Leahy Scale: Good     Standing balance support: No upper extremity supported, During functional activity Standing balance-Leahy Scale: Fair                             ADL either performed or assessed with clinical judgement   ADL Overall ADL's : Needs assistance/impaired                                       General ADL Comments: SUPERVISION don B socks seated EOB. CGA for functional mobility ~100 ft, SpO2 87% on RA.      Pertinent Vitals/Pain Pain Assessment Pain Assessment: No/denies pain     Hand Dominance     Extremity/Trunk Assessment Upper Extremity Assessment Upper Extremity Assessment: Generalized weakness (B UE tremors)   Lower Extremity Assessment Lower Extremity Assessment: Generalized weakness       Communication Communication Communication: No difficulties   Cognition Arousal/Alertness: Awake/alert Behavior During Therapy: WFL for tasks assessed/performed Overall Cognitive Status: Within Functional Limits for tasks assessed  General Comments  SpO2 87-93% on RA during mobility            Home Living Family/patient expects to be discharged to:: Private residence Living Arrangements: Spouse/significant other Available Help at Discharge: Family Type of Home: House                                  Prior Functioning/Environment Prior Level of Function : Independent/Modified Independent;Working/employed             Mobility Comments: heavy Glass blower/designer          OT Problem List: Decreased strength;Decreased activity tolerance         OT  Goals(Current goals can be found in the care plan section) Acute Rehab OT Goals Patient Stated Goal: to improve breathing OT Goal Formulation: With patient Time For Goal Achievement: 06/19/22 Potential to Achieve Goals: Good   AM-PAC OT "6 Clicks" Daily Activity     Outcome Measure Help from another person eating meals?: A Little Help from another person taking care of personal grooming?: A Little Help from another person toileting, which includes using toliet, bedpan, or urinal?: None Help from another person bathing (including washing, rinsing, drying)?: A Little Help from another person to put on and taking off regular upper body clothing?: None Help from another person to put on and taking off regular lower body clothing?: None 6 Click Score: 21   End of Session Equipment Utilized During Treatment: Gait belt Nurse Communication: Mobility status  Activity Tolerance: Patient tolerated treatment well Patient left: in bed;with call bell/phone within reach  OT Visit Diagnosis: Other abnormalities of gait and mobility (R26.89)                Time: 6144-3154 OT Time Calculation (min): 19 min Charges:  OT General Charges $OT Visit: 1 Visit OT Evaluation $OT Eval Low Complexity: 1 Low OT Treatments $Self Care/Home Management : 8-22 mins  Dessie Coma, M.S. OTR/L  06/05/22, 1:29 PM  ascom [phone]

## 2022-06-06 DIAGNOSIS — J441 Chronic obstructive pulmonary disease with (acute) exacerbation: Secondary | ICD-10-CM | POA: Diagnosis not present

## 2022-06-06 LAB — RESPIRATORY PANEL BY PCR

## 2022-06-06 LAB — CBC
HCT: 27.6 % — ABNORMAL LOW (ref 39.0–52.0)
Hemoglobin: 9.1 g/dL — ABNORMAL LOW (ref 13.0–17.0)
MCH: 31.8 pg (ref 26.0–34.0)
MCHC: 33 g/dL (ref 30.0–36.0)
MCV: 96.5 fL (ref 80.0–100.0)
Platelets: 234 10*3/uL (ref 150–400)
RBC: 2.86 MIL/uL — ABNORMAL LOW (ref 4.22–5.81)
RDW: 13.5 % (ref 11.5–15.5)
WBC: 26.9 10*3/uL — ABNORMAL HIGH (ref 4.0–10.5)
nRBC: 0.1 % (ref 0.0–0.2)

## 2022-06-06 LAB — HEMOGLOBIN AND HEMATOCRIT, BLOOD
HCT: 24.1 % — ABNORMAL LOW (ref 39.0–52.0)
Hemoglobin: 8 g/dL — ABNORMAL LOW (ref 13.0–17.0)

## 2022-06-06 LAB — BASIC METABOLIC PANEL
Anion gap: 8 (ref 5–15)
BUN: 58 mg/dL — ABNORMAL HIGH (ref 8–23)
CO2: 23 mmol/L (ref 22–32)
Calcium: 7.9 mg/dL — ABNORMAL LOW (ref 8.9–10.3)
Chloride: 107 mmol/L (ref 98–111)
Creatinine, Ser: 1 mg/dL (ref 0.61–1.24)
GFR, Estimated: >60 mL/min (ref >60)
Glucose, Bld: 252 mg/dL — ABNORMAL HIGH (ref 70–99)
Potassium: 5.1 mmol/L (ref 3.5–5.1)
Sodium: 138 mmol/L (ref 135–145)

## 2022-06-06 LAB — MAGNESIUM: Magnesium: 2.3 mg/dL (ref 1.7–2.4)

## 2022-06-06 MED ORDER — POLYETHYLENE GLYCOL 3350 17 GM/SCOOP PO POWD
1.0000 | Freq: Once | ORAL | Status: AC
  Administered 2022-06-06: 255 g via ORAL
  Filled 2022-06-06: qty 255

## 2022-06-06 MED ORDER — SODIUM CHLORIDE 0.9 % IV SOLN: INTRAVENOUS | Status: DC

## 2022-06-06 MED ORDER — HYDROCODONE-ACETAMINOPHEN 5-325 MG PO TABS
1.0000 | ORAL_TABLET | Freq: Four times a day (QID) | ORAL | Status: DC | PRN
  Administered 2022-06-06 – 2022-06-09 (×6): 2 via ORAL
  Filled 2022-06-06 (×7): qty 2

## 2022-06-06 MED ORDER — BOOST / RESOURCE BREEZE PO LIQD CUSTOM
1.0000 | Freq: Three times a day (TID) | ORAL | Status: DC
  Administered 2022-06-06 – 2022-06-10 (×3): 1 via ORAL

## 2022-06-06 MED ORDER — MORPHINE SULFATE (PF) 2 MG/ML IV SOLN
2.0000 mg | INTRAVENOUS | Status: DC | PRN
  Administered 2022-06-06 – 2022-06-10 (×10): 2 mg via INTRAVENOUS
  Filled 2022-06-06 (×11): qty 1

## 2022-06-06 MED ORDER — HYDROCODONE-ACETAMINOPHEN 5-325 MG PO TABS
1.0000 | ORAL_TABLET | Freq: Four times a day (QID) | ORAL | Status: DC | PRN
  Administered 2022-06-06: 1 via ORAL
  Filled 2022-06-06: qty 1

## 2022-06-06 MED ORDER — BISACODYL 5 MG PO TBEC
20.0000 mg | DELAYED_RELEASE_TABLET | Freq: Once | ORAL | Status: AC
  Administered 2022-06-06: 20 mg via ORAL
  Filled 2022-06-06: qty 4

## 2022-06-06 MED ORDER — IPRATROPIUM-ALBUTEROL 0.5-2.5 (3) MG/3ML IN SOLN
3.0000 mL | Freq: Two times a day (BID) | RESPIRATORY_TRACT | Status: DC
  Administered 2022-06-06 – 2022-06-08 (×4): 3 mL via RESPIRATORY_TRACT
  Filled 2022-06-06 (×5): qty 3

## 2022-06-06 NOTE — Progress Notes (Signed)
Initial Nutrition Assessment  DOCUMENTATION CODES:   Not applicable  INTERVENTION:   Boost Breeze po TID, each supplement provides 250 kcal and 9 grams of protein  Ensure Enlive po TID with diet advancement, each supplement provides 350 kcal and 20 grams of protein.  MVI po daily with diet advancement  Pt at high refeed risk; recommend monitor potassium, magnesium and phosphorus labs daily until stable  NUTRITION DIAGNOSIS:   Increased nutrient needs related to catabolic illness (COPD) as evidenced by estimated needs.  GOAL:   Patient will meet greater than or equal to 90% of their needs  MONITOR:   PO intake, Supplement acceptance, Diet advancement, Labs, Weight trends, Skin, I & O's  REASON FOR ASSESSMENT:   Consult Assessment of nutrition requirement/status  ASSESSMENT:   76 y/o male with h/o Afib, DM, HLD, henria repair (2008) and COPD who is admitted with COPD exacerbation and GIB.  RD working remotely.  Unable to reach pt by phone. Per chart review, pt with good appetite and oral intake in hospital. Pt currently on clear liquids for planned colonoscopy tomorrow. RD will add supplements and MVI to help pt meet his estimated needs. Pt is at refeed risk. Per chart, pt appears weight stable at baseline. RD will obtain nutrition related history and exam at follow up.   Medications reviewed and include: azithromycin, lovenox, nicotine, protonix, prednisone, thiamine   Labs reviewed: K 5.1 wnl, BUN 58(H), Mg 2.3 wnl Wbc- 26.9(H), Hgb 9.1(L), Hct 27.6(L)  NUTRITION - FOCUSED PHYSICAL EXAM: Unable to perform at this time   Diet Order:   Diet Order             Diet NPO time specified  Diet effective midnight           Diet clear liquid Room service appropriate? Yes; Fluid consistency: Thin  Diet effective now                  EDUCATION NEEDS:   No education needs have been identified at this time  Skin:  Skin Assessment: Reviewed RN Assessment  Last  BM:  10/14- type 6  Height:   Ht Readings from Last 1 Encounters:  06/02/22 '5\' 9"'$  (1.753 m)    Weight:   Wt Readings from Last 1 Encounters:  06/02/22 67.9 kg    Ideal Body Weight:  72.7 kg  BMI:  There is no height or weight on file to calculate BMI.  Estimated Nutritional Needs:   Kcal:  1800-2100kcal/day  Protein:  90-105g/day  Fluid:  1.7-2.0L/day  Koleen Distance MS, RD, LDN Please refer to [facility] for RD and/or RD on-call/weekend/after hours pager

## 2022-06-06 NOTE — Progress Notes (Signed)
  PROGRESS NOTE    TERRYL MOLINELLI  YIR:[id number] DOB: October 01, 1945 DOA: 06/04/2022 PCP: Jonetta Osgood, NP  [id number]/[id number]-AA  LOS: 1 day   Brief hospital course:   Assessment & Plan: KNOWLEDGE ESCANDON is a 76 y.o.  male with medical history significant for COPD, extensive smoking hx who presented with severe respiratory distress.     * COPD with acute exacerbation ([place]) --presented with severe respiratory distress, was put on BiPAP.  Received 125 mg of Solu-Medrol, 2 g of magnesium and 2 DuoNebs on route. --CXR no acute finding.  No evidence of bacterial PNA.  Had flu-like symptoms PTA. --respiratory status much improved the next day, and on [place] Plan: --transition to prednisone 40 mg daily --cont scheduled DuoNeb --cont azithromycin  Current smoker --extensive smoking hx, started around age 37 or 13, used to be 3 packs per day but now down to 1 pack per day --cessation encouraged   Acute blood loss anemia --Hgb dropped from 17 to 11 after first bloody BM. Plan: --Hgb BID --transfuse to keep Hgb >8  Acute lower GI bleeding --sudden onset, BM with black stool and fresh blood, with associated abdominal pain.  CTA bleed scan didn't catch a source of bleeding, showed findings that suggest small bowel enteritis. --given presentation and risk factors, likely ischemic bowel. Plan: --bowel prep for colonoscopy tomorrow --pain control  Benign prostatic hyperplasia Continue patient on tamsulosin.   DVT prophylaxis: Lovenox SQ Code Status: Full code  Family Communication:  Level of care: Med-Surg Dispo:   The patient is from: home Anticipated d/c is to: home Anticipated d/c date is: 2-3 days   Subjective and Interval History:  Pt had more episode of bloody BM's overnight.  This morning, pt complained of severe abdominal pain.   Objective: Vitals:   06/05/22 1935 06/06/22 0401 06/06/22 0837 06/06/22 1548  BP: 114/62 (!) 150/89 106/86 123/67  Pulse: 77 76 85 87  Resp:  '20 20 18 18  '$ Temp: 98 F (36.7 C) 97.8 F (36.6 C) 97.6 F (36.4 C) (!) 97.4 F (36.3 C)  TempSrc: Oral  Oral Oral  SpO2: 94% 100% 100% 97%    Intake/Output Summary (Last 24 hours) at 06/06/2022 1941 Last data filed at 06/05/2022 2151 Gross per 24 hour  Intake --  Output 300 ml  Net -300 ml   There were no vitals filed for this visit.  Examination:   Constitutional: NAD, AAOx3 HEENT: conjunctivae and lids normal, EOMI CV: No cyanosis.   RESP: normal respiratory effort, on 2L Neuro: II - XII grossly intact.   Psych: Normal mood and affect.  Appropriate judgement and reason   Data Reviewed: I have personally reviewed labs and imaging studies  Time spent: 35 minutes  Enzo Bi, MD [facility] If 7PM-7AM, please contact night-coverage 06/06/2022, 7:41 PM

## 2022-06-06 NOTE — Progress Notes (Signed)
GI Inpatient Follow-up Note  Subjective:  Patient seen in follow-up for LGI bleeding/hematochezia. Overnight patient reports 2 episodes of hematochezia. He had one episode around 0440 this morning. Nursing reports dark red blood with blots. He reports he does not feel well. He is having intermittent lower abdominal cramping, nausea, and hematochezia. Hemoglobin 9.1 this morning. CTA was negative yesterday for active bleeding or acute findings.   Scheduled Inpatient Medications:   azithromycin  500 mg Oral Daily   enoxaparin (LOVENOX) injection  40 mg Subcutaneous Q24H   ipratropium-albuterol  3 mL Nebulization Q6H   nicotine  14 mg Transdermal Daily   pantoprazole (PROTONIX) IV  40 mg Intravenous Q12H   predniSONE  40 mg Oral Q breakfast   sodium chloride flush  3 mL Intravenous Q12H   thiamine (VITAMIN B1) injection  100 mg Intravenous Daily    PRN Inpatient Medications:  acetaminophen **OR** acetaminophen, albuterol, bisacodyl, guaiFENesin, hydrALAZINE, HYDROcodone-acetaminophen, ondansetron **OR** ondansetron (ZOFRAN) IV  Review of Systems: Constitutional: Weight is stable.  Eyes: No changes in vision. ENT: No oral lesions, sore throat.  GI: see HPI.  Heme/Lymph: No easy bruising.  CV: No chest pain.  GU: No hematuria.  Integumentary: No rashes.  Neuro: No headaches.  Psych: No depression/anxiety.  Endocrine: No heat/cold intolerance.  Allergic/Immunologic: No urticaria.  Resp: No cough, SOB.  Musculoskeletal: No joint swelling.    Physical Examination: BP 106/86 (BP Location: Left Arm)   Pulse 85   Temp 97.6 F (36.4 C) (Oral)   Resp 18   SpO2 100%  Gen: NAD, alert and oriented x 4 HEENT: PEERLA, EOMI, Neck: supple, no JVD or thyromegaly Chest: Course breath sounds bilaterally, decreased breath sounds in right and left lower fields, no accessory muscle use CV: RRR, no m/g/c/r Abd: soft, ND, tender to palpation diffusely, +BS in all four quadrants; no HSM,  guarding, ridigity, or rebound tenderness Ext: no edema, well perfused with 2+ pulses, Skin: no rash or lesions noted Lymph: no LAD  Data: Lab Results  Component Value Date   WBC 26.9 (H) 06/06/2022   HGB 9.1 (L) 06/06/2022   HCT 27.6 (L) 06/06/2022   MCV 96.5 06/06/2022   PLT 234 06/06/2022   Recent Labs  Lab 06/05/22 1747 06/05/22 2028 06/06/22 0701  HGB 11.7* 11.7* 9.1*   Lab Results  Component Value Date   NA 138 06/06/2022   K 5.1 06/06/2022   CL 107 06/06/2022   CO2 23 06/06/2022   BUN 58 (H) 06/06/2022   CREATININE 1.00 06/06/2022   Lab Results  Component Value Date   ALT 26 06/04/2022   AST 29 06/04/2022   ALKPHOS 68 06/04/2022   BILITOT 0.6 06/04/2022   No results for input(s): "APTT", "INR", "PTT" in the last 168 hours.  Assessment/Plan:  76 y/o Caucasian male with a PMH of COPD, HTN, HLD, and T2DM admitted for LGI bleeding  LGI bleeding   COPD exacerbation - on O2  Leukocytosis - WBC 26.9K  T2DM  Recommendations:  - Maintain 2 large bore IVs - Continue to monitor H&H closely. Transfuse for Hgb <7.0.  - Given ongoing hematochezia, recommend diagnostic colonoscopy tomorrow with Dr. Alice Reichert for luminal evaluation. Patient agreeable.  - Clear liquid diet today. NPO after midnight. Bowel prep orders placed.  - Continue supportive care with pain control, antiemetics per primary team - Colonoscopy tomorrow - See procedure note for findings and further recommendations  I reviewed the risks (including bleeding, perforation, infection, anesthesia complications, cardiac/respiratory complications),  benefits and alternatives of colonoscopy. Patient consents to proceed.    Please call with questions or concerns.   Octavia Bruckner, PA-C [facility] [phone]

## 2022-06-06 NOTE — Progress Notes (Signed)
Patient called out for help going to the bathroom. Observed patient had large amount of dark, red blood coming from his rectum with a few small clots. All vital signs all WNL. Hospitalist aware and awaiting CBC to be drawn soon to monitor hemoglobin. Patient resting comfortably at this time.

## 2022-06-07 ENCOUNTER — Encounter: Payer: Self-pay | Admitting: Hospitalist

## 2022-06-07 ENCOUNTER — Inpatient Hospital Stay: Payer: Medicare HMO | Admitting: Anesthesiology

## 2022-06-07 ENCOUNTER — Encounter
Admission: EM | Disposition: A | Discharge status: Home / Self Care | Payer: Self-pay | Source: Home / Self Care | Attending: Hospitalist

## 2022-06-07 DIAGNOSIS — J441 Chronic obstructive pulmonary disease with (acute) exacerbation: Secondary | ICD-10-CM | POA: Diagnosis not present

## 2022-06-07 HISTORY — PX: COLONOSCOPY: SHX5424

## 2022-06-07 LAB — BASIC METABOLIC PANEL
Anion gap: 8 (ref 5–15)
BUN: 86 mg/dL — ABNORMAL HIGH (ref 8–23)
CO2: 26 mmol/L (ref 22–32)
Calcium: 8 mg/dL — ABNORMAL LOW (ref 8.9–10.3)
Chloride: 107 mmol/L (ref 98–111)
Creatinine, Ser: 1.27 mg/dL — ABNORMAL HIGH (ref 0.61–1.24)
GFR, Estimated: 59 mL/min — ABNORMAL LOW (ref >60)
Glucose, Bld: 155 mg/dL — ABNORMAL HIGH (ref 70–99)
Potassium: 4.6 mmol/L (ref 3.5–5.1)
Sodium: 141 mmol/L (ref 135–145)

## 2022-06-07 LAB — CBC
HCT: 23.5 % — ABNORMAL LOW (ref 39.0–52.0)
Hemoglobin: 7.8 g/dL — ABNORMAL LOW (ref 13.0–17.0)
MCH: 32 pg (ref 26.0–34.0)
MCHC: 33.2 g/dL (ref 30.0–36.0)
MCV: 96.3 fL (ref 80.0–100.0)
Platelets: 230 10*3/uL (ref 150–400)
RBC: 2.44 MIL/uL — ABNORMAL LOW (ref 4.22–5.81)
RDW: 13.8 % (ref 11.5–15.5)
WBC: 27.3 10*3/uL — ABNORMAL HIGH (ref 4.0–10.5)
nRBC: 0.4 % — ABNORMAL HIGH (ref 0.0–0.2)

## 2022-06-07 LAB — PREPARE RBC (CROSSMATCH)

## 2022-06-07 LAB — HEMOGLOBIN AND HEMATOCRIT, BLOOD
HCT: 25.5 % — ABNORMAL LOW (ref 39.0–52.0)
Hemoglobin: 8.6 g/dL — ABNORMAL LOW (ref 13.0–17.0)

## 2022-06-07 LAB — MAGNESIUM: Magnesium: 2.6 mg/dL — ABNORMAL HIGH (ref 1.7–2.4)

## 2022-06-07 SURGERY — COLONOSCOPY
Anesthesia: General

## 2022-06-07 MED ORDER — SODIUM CHLORIDE 0.9 % IV SOLN: INTRAVENOUS | Status: AC

## 2022-06-07 MED ORDER — SODIUM CHLORIDE 0.9% IV SOLUTION: Freq: Once | INTRAVENOUS | Status: AC

## 2022-06-07 MED ORDER — BISACODYL 5 MG PO TBEC
20.0000 mg | DELAYED_RELEASE_TABLET | Freq: Once | ORAL | Status: AC
  Administered 2022-06-07: 20 mg via ORAL
  Filled 2022-06-07: qty 4

## 2022-06-07 MED ORDER — PEG 3350-KCL-NA BICARB-NACL 420 G PO SOLR
4000.0000 mL | Freq: Once | ORAL | Status: AC
  Administered 2022-06-07: 4000 mL via ORAL
  Filled 2022-06-07: qty 4000

## 2022-06-07 MED ORDER — PROPOFOL 10 MG/ML IV BOLUS
INTRAVENOUS | Status: DC | PRN
  Administered 2022-06-07: 10 mg via INTRAVENOUS
  Administered 2022-06-07: 60 mg via INTRAVENOUS
  Administered 2022-06-07: 10 mg via INTRAVENOUS

## 2022-06-07 MED ORDER — PHENYLEPHRINE HCL (PRESSORS) 10 MG/ML IV SOLN
INTRAVENOUS | Status: DC | PRN
  Administered 2022-06-07: 80 ug via INTRAVENOUS

## 2022-06-07 NOTE — Op Note (Signed)
[facility] Patient Name: Brian Key Procedure Date: 06/07/2022 8:41 AM MRN: [id number] Account #: [id number] Date of Birth: [birth year] Admit Type: Inpatient Age: 76 Room: [facility] ROOM 4 Gender: Male Note Status: Finalized Instrument Name: [name] [id number] Procedure:             Colonoscopy Indications:           Hematochezia, Acute post hemorrhagic anemia Providers:             Benay Pike. Alice Reichert MD, MD Referring MD:          Jonetta Osgood (Referring MD) Medicines:             Propofol per Anesthesia Complications:         No immediate complications. Procedure:             Pre-Anesthesia Assessment:                        - The risks and benefits of the procedure and the                         sedation options and risks were discussed with the                         patient. All questions were answered and informed                         consent was obtained.                        - Patient identification and proposed procedure were                         verified prior to the procedure by the nurse. The                         procedure was verified in the procedure room.                        - ASA Grade Assessment: III - A patient with severe                         systemic disease.                        - After reviewing the risks and benefits, the patient                         was deemed in satisfactory condition to undergo the                         procedure.                        After obtaining informed consent, the colonoscope was                         passed under direct vision. Throughout the procedure,                         the patient's  blood pressure, pulse, and oxygen                         saturations were monitored continuously. The                         Colonoscope was introduced through the anus with the                         intention of advancing to the cecum. The scope was                          advanced to the descending colon before the procedure                         was aborted. Medications were given. The colonoscopy                         was technically difficult and complex due to                         inadequate bowel prep and poor bowel prep. The patient                         tolerated the procedure well. The quality of the bowel                         preparation was poor. The colonoscopy was aborted due                         to inadequate bowel prep. Findings:      The perianal and digital rectal examinations were normal. Pertinent       negatives include normal sphincter tone and no palpable rectal lesions.      Copious quantities of liquid semi-solid stool was found in the rectum,       in the sigmoid colon and in the descending colon, precluding       visualization. Lavage of the area was performed using a moderate amount       of sterile water, resulting in incomplete clearance with continued poor       visualization. Impression:            - Preparation of the colon was poor.                        - The procedure was aborted due to inadequate bowel                         prep.                        - Stool in the rectum, in the sigmoid colon and in the                         descending colon.                        - No specimens collected. Recommendation:        - Return patient to hospital  ward prior to repeat                         procedure.                        - Repeat colonoscopy in 1 day because the bowel                         preparation was suboptimal.                        - Clear liquid diet today.                        - Repeat bowel prep today. Procedure Code(s):     --- Professional ---                        [phone], 36, Colonoscopy, flexible; diagnostic,                         including collection of specimen(s) by brushing or                         washing, when performed (separate procedure) Diagnosis Code(s):      --- Professional ---                        D62, Acute posthemorrhagic anemia                        K92.1, Melena (includes Hematochezia) CPT copyright 2019 American Medical Association. All rights reserved. The codes documented in this report are preliminary and upon coder review may  be revised to meet current compliance requirements. Efrain Sella MD, MD 06/07/2022 9:37:43 AM This report has been signed electronically. Number of Addenda: 0 Note Initiated On: 06/07/2022 8:41 AM Total Procedure Duration: 0 hours 5 minutes 1 second  Estimated Blood Loss:  Estimated blood loss: none.      Brian Key

## 2022-06-07 NOTE — Progress Notes (Signed)
Delay in obtaining bowel prep from pharmacy, medication coming from [facility].

## 2022-06-07 NOTE — Anesthesia Postprocedure Evaluation (Signed)
Anesthesia Post Note  Patient: Brian Key  Procedure(s) Performed: COLONOSCOPY  Patient location during evaluation: PACU Anesthesia Type: General Level of consciousness: awake and alert Pain management: pain level controlled Vital Signs Assessment: post-procedure vital signs reviewed and stable Respiratory status: spontaneous breathing, nonlabored ventilation, respiratory function stable and patient connected to nasal cannula oxygen Cardiovascular status: blood pressure returned to baseline and stable Postop Assessment: no apparent nausea or vomiting Anesthetic complications: no   No notable events documented.   Last Vitals:  Vitals:   06/07/22 0947 06/07/22 1006  BP: (!) 118/59 (!) 99/49  Pulse: 91 80  Resp: (!) 24 18  Temp: 36.8 C (!) 36.3 C  SpO2: 97% 100%    Last Pain:  Vitals:   06/07/22 1006  TempSrc: Oral  PainSc:                  [name]

## 2022-06-07 NOTE — Transfer of Care (Signed)
Immediate Anesthesia Transfer of Care Note  Patient: Brian Key  Procedure(s) Performed: COLONOSCOPY  Patient Location: PACU  Anesthesia Type:General  Level of Consciousness: awake, alert  and oriented  Airway & Oxygen Therapy: Patient Spontanous Breathing  Post-op Assessment: Postop VSS  Post vital signs: Reviewed and stable  Last Vitals:  Vitals Value Taken Time  BP 110/56 06/07/22 0938  Temp 36.6 C 06/07/22 0935  Pulse 84 06/07/22 0939  Resp 23 06/07/22 0939  SpO2 94 % 06/07/22 0939  Vitals shown include unvalidated device data.  Last Pain:  Vitals:   06/07/22 0842  TempSrc: Temporal  PainSc: 6       Patients Stated Pain Goal: 0 (43/32/95 1884)  Complications: No notable events documented.

## 2022-06-07 NOTE — Plan of Care (Signed)
°  Problem: Education: °Goal: Knowledge of disease or condition will improve °Outcome: Progressing °Goal: Knowledge of the prescribed therapeutic regimen will improve °Outcome: Progressing °Goal: Individualized Educational Video(s) °Outcome: Progressing °  °

## 2022-06-07 NOTE — Anesthesia Preprocedure Evaluation (Addendum)
Anesthesia Evaluation  Patient identified by MRN, date of birth, ID band Patient awake    Reviewed: Allergy & Precautions, NPO status , Patient's Chart, lab work & pertinent test results  Airway Mallampati: III  TM Distance: >3 FB Neck ROM: full    Dental  (+) Edentulous Upper, Edentulous Lower   Pulmonary COPD, Current Smoker and Patient abstained from smoking.,   3L [place]. 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  Received 125 mg of Solu-Medrol, 2 g of magnesium and 2 DuoNebs on route  Past Medical History: No date: Back problem No date: COPD (chronic obstructive pulmonary disease) (HCC) No date: Diabetes mellitus without complication ([place]) No date: Hyperlipidemia  Past Surgical History: 2009: [place]: HEMORRHOID SURGERY 2008: HERNIA REPAIR     Comment:  umbilical 89/38/1017: LIPOMA EXCISION     Comment:  back/ Dr Bary Castilla No date: NECK SURGERY 2007: WRIST SURGERY; Left  BMI    Body Mass Index: 22.07 kg/m      Reproductive/Obstetrics negative OB ROS                           Anesthesia Physical Anesthesia Plan  ASA: 3  Anesthesia Plan: General   Post-op Pain Management: Minimal or no pain anticipated   Induction: Intravenous  PONV Risk Score and Plan: Propofol infusion, TIVA and Treatment may vary due  to age or medical condition  Airway Management Planned: Natural Airway and Mask  Additional Equipment:   Intra-op Plan:   Post-operative Plan:   Informed Consent: I have reviewed the patients History and Physical, chart, labs and discussed the procedure including the risks, benefits and alternatives for the proposed anesthesia with the patient or authorized representative who has indicated his/her understanding and acceptance.     Dental Advisory Given  Plan Discussed with: Anesthesiologist, CRNA and Surgeon  Anesthesia Plan Comments:       Anesthesia Quick Evaluation

## 2022-06-07 NOTE — OR Nursing (Signed)
Lots of blood unable to see, inadequate prep.

## 2022-06-07 NOTE — Progress Notes (Signed)
  PROGRESS NOTE    Brian Key  TOI:[id number] DOB: 1945-11-15 DOA: 06/04/2022 PCP: Jonetta Osgood, NP  222A/222A-AA  LOS: 2 days   Brief hospital course:   Assessment & Plan: Brian Key is a 76 y.o.  male with medical history significant for COPD, extensive smoking hx who presented with severe respiratory distress.     * COPD with acute exacerbation ([place]) --presented with severe respiratory distress, was put on BiPAP.  Received 125 mg of Solu-Medrol, 2 g of magnesium and 2 DuoNebs on route. --CXR no acute finding.  No evidence of bacterial PNA.  Had flu-like symptoms PTA. --respiratory status much improved the next day, and on [place] Plan: --cont prednisone 40 mg daily --cont scheduled DuoNeb --cont azithromycin  Current smoker --extensive smoking hx, started around age 56 or 20, used to be 3 packs per day but now down to 1 pack per day --cessation encouraged   Acute blood loss anemia --Hgb dropped from 17 to 11 after first bloody BM. Plan: --1u pRBC for Hgb 7.8 --Hgb BID --transfuse to keep Hgb >8  Acute lower GI bleeding --sudden onset, BM with black stool and fresh blood, with associated abdominal pain.  CTA bleed scan didn't catch a source of bleeding, showed findings that suggest small bowel enteritis. --given presentation and risk factors, likely ischemic bowel. --colonoscopy today found poor bowel prep Plan: --repeat bowel prep for colonoscopy tomorrow --pain control  Benign prostatic hyperplasia Continue patient on tamsulosin.   DVT prophylaxis: Lovenox SQ Code Status: Full code  Family Communication:  Level of care: Med-Surg Dispo:   The patient is from: home Anticipated d/c is to: home Anticipated d/c date is: 2-3 days   Subjective and Interval History:  Pt went for colonoscopy, however, the prep was poor.  Pt continued to have black stools, but no fresh blood anymore.  Continued to have abdominal pain but controlled by pain  meds.   Objective: Vitals:   06/07/22 0947 06/07/22 1006 06/07/22 1402 06/07/22 1430  BP: (!) 118/59 (!) 99/49 112/75 (!) 117/50  Pulse: 91 80 78 93  Resp: (!) '24 18  17  '$ Temp: 98.3 F (36.8 C) (!) 97.4 F (36.3 C) 97.7 F (36.5 C) 97.6 F (36.4 C)  TempSrc:  Oral Oral   SpO2: 97% 100% 90%   Weight:      Height:        Intake/Output Summary (Last 24 hours) at 06/07/2022 1500 Last data filed at 06/07/2022 0150 Gross per 24 hour  Intake 0 ml  Output 350 ml  Net -350 ml   Filed Weights   06/07/22 0608 06/07/22 0842  Weight: 67.8 kg 67.9 kg    Examination:   Constitutional: NAD, AAOx3 HEENT: conjunctivae and lids normal, EOMI CV: No cyanosis.   RESP: normal respiratory effort Neuro: II - XII grossly intact.   Psych: Normal mood and affect.  Appropriate judgement and reason   Data Reviewed: I have personally reviewed labs and imaging studies  Time spent: 35 minutes  Enzo Bi, MD [facility] If 7PM-7AM, please contact night-coverage 06/07/2022, 3:00 PM

## 2022-06-07 NOTE — TOC Initial Note (Signed)
Transition of Care [facility]) - Initial/Assessment Note    Patient Details  Name: Brian Key MRN: [id number] Date of Birth: [birth year]  Transition of Care [facility]) CM/SW Contact:    Beverly Sessions, RN Phone Number: 06/07/2022, 3:35 PM  Clinical Narrative:                  Admitted for: COPD and Gi bleed Admitted from: home with wife  PCP: Valetta Fuller  Current home health/prior home health/DME: NA  PT recommending no follow up for DME Met with patient at bedside and discussed home health RN for COPD.  Patient declines, states that he currently works 5 days a week 10 hour days.   Patient currently requiring acute O2 States his son will transport at discharge         Patient Goals and CMS Choice        Expected Discharge Plan and Services                                                Prior Living Arrangements/Services                       Activities of Daily Living Home Assistive Devices/Equipment: None ADL Screening (condition at time of admission) Patient's cognitive ability adequate to safely complete daily activities?: Yes Is the patient deaf or have difficulty hearing?: No Does the patient have difficulty seeing, even when wearing glasses/contacts?: No Does the patient have difficulty concentrating, remembering, or making decisions?: No Patient able to express need for assistance with ADLs?: Yes Does the patient have difficulty dressing or bathing?: No Independently performs ADLs?: Yes (appropriate for developmental age) Does the patient have difficulty walking or climbing stairs?: No Weakness of Legs: Both Weakness of Arms/Hands: Both  Permission Sought/Granted                  Emotional Assessment              Admission diagnosis:  Respiratory distress [R06.03] COPD exacerbation ([place]) [J44.1] COPD with acute exacerbation ([place]) [J44.1] Patient Active Problem List   Diagnosis Date Noted   COPD with acute exacerbation  ([place]) 06/05/2022   Respiratory distress 06/05/2022   Acute lower GI bleeding 06/05/2022   Acute blood loss anemia 06/05/2022   Current smoker 06/05/2022   Allergic reaction to bee sting 04/19/2022   Hypokalemia 04/19/2022   Leukocytosis 04/19/2022   Elevated brain natriuretic peptide (BNP) level 04/19/2022   Prediabetes 02/07/2022   Benign prostatic hyperplasia 02/04/2022   Bilateral primary osteoarthritis of hip 02/04/2022   Muscle cramp 01/02/2020   Neuropathy 06/19/2019   Foot pain, bilateral 06/15/2019   Low back pain 06/15/2019   Bilateral hand numbness 06/12/2019   Neck pain 06/12/2019   Right arm pain 06/12/2019   Numbness and tingling 05/12/2019   Tingling 05/12/2019   Skin nodule 01/11/2018   Claudication ([place]) 03/18/2017   Lipoma of back 02/16/2017   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[place]) 03/05/2015   PCP:  Jonetta Osgood, NP Pharmacy:   [facility] [address] [place] [address] Phone: [phone] Fax: [phone]  [facility] [place], [place] - [address] [place] [address] Phone: [phone] Fax: [phone]  [facility]  [place], [place] [address] Phone: [phone] Fax: [phone]     Social Determinants of Health (SDOH) Interventions Food Insecurity Interventions: Patient Refused Housing Interventions: Patient Refused Transportation Interventions: Patient Refused Utilities Interventions: Patient Refused  Readmission Risk Interventions     No data to display

## 2022-06-07 NOTE — Progress Notes (Signed)
No stools, pt experienced abd pain and discomfort, passing gas. SRP, RN

## 2022-06-07 NOTE — Progress Notes (Signed)
Pt completed prep, tolerated, consent signed, IV fluids to start at noted in orders. SRP, RN

## 2022-06-08 ENCOUNTER — Encounter
Admission: EM | Disposition: A | Discharge status: Home / Self Care | Payer: Self-pay | Source: Home / Self Care | Attending: Hospitalist

## 2022-06-08 ENCOUNTER — Encounter: Payer: Self-pay | Admitting: Internal Medicine

## 2022-06-08 ENCOUNTER — Inpatient Hospital Stay: Payer: Medicare HMO | Admitting: Registered Nurse

## 2022-06-08 DIAGNOSIS — J441 Chronic obstructive pulmonary disease with (acute) exacerbation: Secondary | ICD-10-CM | POA: Diagnosis not present

## 2022-06-08 HISTORY — PX: COLONOSCOPY: SHX5424

## 2022-06-08 LAB — BASIC METABOLIC PANEL
Anion gap: 6 (ref 5–15)
BUN: 58 mg/dL — ABNORMAL HIGH (ref 8–23)
CO2: 27 mmol/L (ref 22–32)
Calcium: 7.8 mg/dL — ABNORMAL LOW (ref 8.9–10.3)
Chloride: 110 mmol/L (ref 98–111)
Creatinine, Ser: 1.05 mg/dL (ref 0.61–1.24)
GFR, Estimated: >60 mL/min (ref >60)
Glucose, Bld: 128 mg/dL — ABNORMAL HIGH (ref 70–99)
Potassium: 4.2 mmol/L (ref 3.5–5.1)
Sodium: 143 mmol/L (ref 135–145)

## 2022-06-08 LAB — CBC
HCT: 24 % — ABNORMAL LOW (ref 39.0–52.0)
Hemoglobin: 8.1 g/dL — ABNORMAL LOW (ref 13.0–17.0)
MCH: 32.1 pg (ref 26.0–34.0)
MCHC: 33.8 g/dL (ref 30.0–36.0)
MCV: 95.2 fL (ref 80.0–100.0)
Platelets: 226 10*3/uL (ref 150–400)
RBC: 2.52 MIL/uL — ABNORMAL LOW (ref 4.22–5.81)
RDW: 14.5 % (ref 11.5–15.5)
WBC: 17.2 10*3/uL — ABNORMAL HIGH (ref 4.0–10.5)
nRBC: 2.3 % — ABNORMAL HIGH (ref 0.0–0.2)

## 2022-06-08 LAB — MAGNESIUM: Magnesium: 2.7 mg/dL — ABNORMAL HIGH (ref 1.7–2.4)

## 2022-06-08 LAB — PREPARE RBC (CROSSMATCH)

## 2022-06-08 SURGERY — COLONOSCOPY
Anesthesia: General

## 2022-06-08 MED ORDER — ARFORMOTEROL TARTRATE 15 MCG/2ML IN NEBU
15.0000 ug | INHALATION_SOLUTION | Freq: Two times a day (BID) | RESPIRATORY_TRACT | Status: DC
  Filled 2022-06-08 (×2): qty 2

## 2022-06-08 MED ORDER — MORPHINE SULFATE (PF) 2 MG/ML IV SOLN
2.0000 mg | Freq: Once | INTRAVENOUS | Status: AC
  Administered 2022-06-09: 2 mg via INTRAVENOUS
  Filled 2022-06-08: qty 1

## 2022-06-08 MED ORDER — PHENYLEPHRINE HCL (PRESSORS) 10 MG/ML IV SOLN
INTRAVENOUS | Status: DC | PRN
  Administered 2022-06-08 (×3): 160 ug via INTRAVENOUS
  Administered 2022-06-08: 80 ug via INTRAVENOUS

## 2022-06-08 MED ORDER — SODIUM CHLORIDE 0.9 % IV SOLN: INTRAVENOUS | Status: DC

## 2022-06-08 MED ORDER — MAGNESIUM CITRATE PO SOLN
1.0000 | Freq: Once | ORAL | Status: AC
  Administered 2022-06-08: 1 via ORAL
  Filled 2022-06-08: qty 296

## 2022-06-08 MED ORDER — PROPOFOL 10 MG/ML IV BOLUS
INTRAVENOUS | Status: DC | PRN
  Administered 2022-06-08: 20 mg via INTRAVENOUS
  Administered 2022-06-08: 50 mg via INTRAVENOUS

## 2022-06-08 MED ORDER — UMECLIDINIUM BROMIDE 62.5 MCG/ACT IN AEPB
1.0000 | INHALATION_SPRAY | Freq: Every day | RESPIRATORY_TRACT | Status: DC
  Administered 2022-06-09 – 2022-06-11 (×3): 1 via RESPIRATORY_TRACT
  Filled 2022-06-08: qty 7

## 2022-06-08 MED ORDER — PROPOFOL 500 MG/50ML IV EMUL
INTRAVENOUS | Status: DC | PRN
  Administered 2022-06-08: 100 ug/kg/min via INTRAVENOUS

## 2022-06-08 MED ORDER — SODIUM CHLORIDE 0.9% IV SOLUTION: Freq: Once | INTRAVENOUS | Status: AC

## 2022-06-08 NOTE — Interval H&P Note (Signed)
History and Physical Interval Note:  06/08/2022 3:28 PM  Brian Key  has presented today for surgery, with the diagnosis of lower gi bleed, anemia.  The various methods of treatment have been discussed with the patient and family. After consideration of risks, benefits and other options for treatment, the patient has consented to  Procedure(s): COLONOSCOPY (N/A) as a surgical intervention.  The patient's history has been reviewed, patient examined, no change in status, stable for surgery.  I have reviewed the patient's chart and labs.  Questions were answered to the patient's satisfaction.     [place], [place]

## 2022-06-08 NOTE — Progress Notes (Signed)
       CROSS COVER NOTE  NAME: Brian Key MRN: [id number] DOB : 1946/08/11    Date of Service   06/08/2022   HPI/Events of Note   Medication request received for 7/10 abdominal pain not relieved by PRN Morphine.   Interventions   Assessment/Plan:  Morphine one time dose 0.5 mg Dilaudid x1     This document was prepared using Dragon voice recognition software and may include unintentional dictation errors.  Neomia Glass DNP, MBA, FNP-BC Nurse Practitioner [facility] Pager [phone]

## 2022-06-08 NOTE — Care Management Important Message (Signed)
Important Message  Patient Details  Name: Brian Key MRN: [id number] Date of Birth: August 10, 1946   Medicare Important Message Given:  Yes     Dannette Barbara 06/08/2022, 12:43 PM

## 2022-06-08 NOTE — Op Note (Signed)
[facility] Patient Name: Brian Key Procedure Date: 06/08/2022 3:30 PM MRN: [id number] Account #: [id number] Date of Birth: 03/12/1946 Admit Type: Inpatient Age: 76 Room: [facility] ENDO ROOM 4 Gender: Male Note Status: Finalized Instrument Name: [name] [id number] Procedure:             Colonoscopy Indications:           Hematochezia, Melena, Acute post hemorrhagic anemia Providers:             Benay Pike. Alice Reichert MD, MD Referring MD:          Jonetta Osgood (Referring MD) Medicines:             Propofol per Anesthesia Complications:         No immediate complications. Estimated blood loss: None. Procedure:             Pre-Anesthesia Assessment:                        - The risks and benefits of the procedure and the                         sedation options and risks were discussed with the                         patient. All questions were answered and informed                         consent was obtained.                        - Patient identification and proposed procedure were                         verified prior to the procedure by the nurse. The                         procedure was verified in the procedure room.                        - ASA Grade Assessment: III - A patient with severe                         systemic disease.                        - After reviewing the risks and benefits, the patient                         was deemed in satisfactory condition to undergo the                         procedure.                        After obtaining informed consent, the colonoscope was                         passed under direct vision. Throughout the procedure,  the patient's blood pressure, pulse, and oxygen                         saturations were monitored continuously. The                         Colonoscope was introduced through the anus and                         advanced to the the cecum, identified  by appendiceal                         orifice and ileocecal valve. The colonoscopy was                         performed without difficulty. The patient tolerated                         the procedure well. The quality of the bowel                         preparation was fair. The ileocecal valve, appendiceal                         orifice, and rectum were photographed. Findings:      The perianal and digital rectal examinations were normal. Pertinent       negatives include normal sphincter tone and no palpable rectal lesions.      A 10 mm polyp was found in the rectum. The polyp was semi-pedunculated.       The polyp was removed with a hot snare. Resection and retrieval were       complete.      Hematin (altered blood/coffee-ground-like material) was found in the       entire colon. Lavage of the area was performed using a moderate amount       of sterile water, resulting in clearance with good visualization.      There is no endoscopic evidence of bleeding, diverticula, inflammation       or mass in the entire colon.      The exam was otherwise without abnormality. Impression:            - Preparation of the colon was fair.                        - One 10 mm polyp in the rectum, removed with a hot                         snare. Resected and retrieved.                        - Blood in the entire examined colon.                        - The examination was otherwise normal. Recommendation:        - Patient has a contact number available for                         emergencies. The signs and symptoms of potential  delayed complications were discussed with the patient.                         Return to normal activities tomorrow. Written                         discharge instructions were provided to the patient.                        - Return patient to hospital ward for ongoing care.                        - Perform an upper GI endoscopy tomorrow.                         - Resume regular diet.                        - Continue present medications.                        - Use Protonix (pantoprazole) 40 mg PO daily. Procedure Code(s):     --- Professional ---                        [phone], Colonoscopy, flexible; with removal of                         tumor(s), polyp(s), or other lesion(s) by snare                         technique Diagnosis Code(s):     --- Professional ---                        D62, Acute posthemorrhagic anemia                        K92.1, Melena (includes Hematochezia)                        K92.2, Gastrointestinal hemorrhage, unspecified                        K62.1, Rectal polyp CPT copyright 2019 American Medical Association. All rights reserved. The codes documented in this report are preliminary and upon coder review may  be revised to meet current compliance requirements. Efrain Sella MD, MD 06/08/2022 4:05:57 PM This report has been signed electronically. Number of Addenda: 0 Note Initiated On: 06/08/2022 3:30 PM Scope Withdrawal Time: 0 hours 11 minutes 13 seconds  Total Procedure Duration: 0 hours 18 minutes 23 seconds  Estimated Blood Loss:  Estimated blood loss: none. Estimated blood loss: none.      [facility]

## 2022-06-08 NOTE — Progress Notes (Signed)
  PROGRESS NOTE    Brian Key  ALP:[id number] DOB: 18-Jul-1946 DOA: 06/04/2022 PCP: Brian Osgood, NP  225A/225A-AA  LOS: 3 days   Brief hospital course:   Assessment & Plan: Brian Key is a 76 y.o.  male with medical history significant for COPD, extensive smoking hx who presented with severe respiratory distress.     * COPD with acute exacerbation ([place]) --presented with severe respiratory distress, was put on BiPAP.  Received 125 mg of Solu-Medrol, 2 g of magnesium and 2 DuoNebs on route. --CXR no acute finding.  No evidence of bacterial PNA.  Had flu-like symptoms PTA. --respiratory status much improved the next day, and on 2-3 L [place].  Weaned down to RA today. Plan: --cont prednisone 40 mg daily --cont scheduled DuoNeb --cont azithromycin --resume home bronchodilator  Current smoker --extensive smoking hx, started around age 52 or 66, used to be 3 packs per day but now down to 1 pack per day --cessation encouraged   Acute blood loss anemia --Hgb dropped from 17 to 11 after first bloody BM, and in the next few days dropped to as low as 7.8. --s/p 1u pRBC for Hgb 7.8 Plan: --2nd unti of pRBC today for Hgb 8.1 --transfuse to keep Hgb >8 --GI workup   Acute GI bleeding --sudden onset, BM with black stool and fresh blood, with associated abdominal pain.  CTA bleed scan didn't catch a source of bleeding, showed findings that suggest small bowel enteritis. Plan: --repeat colonoscopy today found no bleeding source --plan for EGD tomorrow --pain control  Benign prostatic hyperplasia Continue patient on tamsulosin.   DVT prophylaxis: Lovenox SQ Code Status: Full code  Family Communication:  Level of care: Med-Surg Dispo:   The patient is from: home Anticipated d/c is to: home Anticipated d/c date is: 2-3 days   Subjective and Interval History:  Colonoscopy showed no bleeding source.    Abdominal pain improved today.  On room air.      Objective: Vitals:   06/08/22 1502 06/08/22 1605 06/08/22 1612 06/08/22 1634  BP: (!) 149/67 96/82 (!) 144/66   Pulse: 81 74 95   Resp: 20 (!) 22 (!) 23 20  Temp: (!) 96.8 F (36 C)     TempSrc: Temporal     SpO2: 94% (!) 89% 92%   Weight:      Height:        Intake/Output Summary (Last 24 hours) at 06/08/2022 1949 Last data filed at 06/08/2022 1535 Gross per 24 hour  Intake 978.67 ml  Output 800 ml  Net 178.67 ml   Filed Weights   06/07/22 0608 06/07/22 0842  Weight: 67.8 kg 67.9 kg    Examination:   Constitutional: NAD, AAOx3 HEENT: conjunctivae and lids normal, EOMI CV: No cyanosis.   RESP: normal respiratory effort, on RA Neuro: II - XII grossly intact.     Data Reviewed: I have personally reviewed labs and imaging studies  Time spent: 35 minutes  Enzo Bi, MD [facility] If 7PM-7AM, please contact night-coverage 06/08/2022, 7:49 PM

## 2022-06-08 NOTE — Progress Notes (Signed)
LE: Report given to ENDO, pt prepare for procedure. SRP RN

## 2022-06-08 NOTE — Transfer of Care (Signed)
Immediate Anesthesia Transfer of Care Note  Patient: Brian Key  Procedure(s) Performed: COLONOSCOPY  Patient Location: PACU  Anesthesia Type:General  Level of Consciousness: awake and alert   Airway & Oxygen Therapy: Patient Spontanous Breathing and Patient connected to nasal cannula oxygen  Post-op Assessment: Report given to RN and Post -op Vital signs reviewed and stable  Post vital signs: Reviewed and stable  Last Vitals:  Vitals Value Taken Time  BP 96/82 06/08/22 1606  Temp    Pulse 100 06/08/22 1607  Resp 19 06/08/22 1607  SpO2 98 % 06/08/22 1607  Vitals shown include unvalidated device data.  Last Pain:  Vitals:   06/08/22 1601  TempSrc:   PainSc: Asleep      Patients Stated Pain Goal: 0 (91/22/58 3462)  Complications: No notable events documented.

## 2022-06-08 NOTE — Anesthesia Preprocedure Evaluation (Signed)
Anesthesia Evaluation  Patient identified by MRN, date of birth, ID band Patient awake    Reviewed: Allergy & Precautions, H&P , NPO status , Patient's Chart, lab work & pertinent test results, reviewed documented beta blocker date and time   Airway Mallampati: II   Neck ROM: full    Dental  (+) Poor Dentition   Pulmonary COPD, Current Smoker,    Pulmonary exam normal        Cardiovascular Exercise Tolerance: Good negative cardio ROS Normal cardiovascular exam Rhythm:regular Rate:Normal     Neuro/Psych negative neurological ROS  negative psych ROS   GI/Hepatic negative GI ROS, Neg liver ROS,   Endo/Other  negative endocrine ROSdiabetes, Well Controlled  Renal/GU negative Renal ROS  negative genitourinary   Musculoskeletal   Abdominal   Peds  Hematology  (+) Blood dyscrasia, anemia ,   Anesthesia Other Findings Past Medical History: No date: Back problem No date: COPD (chronic obstructive pulmonary disease) (HCC) No date: Diabetes mellitus without complication ([place]) No date: Hyperlipidemia Past Surgical History: 2009: ANKLE SURGERY 06/07/2022: COLONOSCOPY; N/A     Comment:  Procedure: COLONOSCOPY;  Surgeon: [place], Benay Pike, MD;              Location: [facility] ENDOSCOPY;  Service: Gastroenterology;                Laterality: N/A; 1978: HEMORRHOID SURGERY 2008: HERNIA REPAIR     Comment:  umbilical 19/14/7829: LIPOMA EXCISION     Comment:  back/ Dr Bary Castilla No date: NECK SURGERY 2007: WRIST SURGERY; Left BMI    Body Mass Index: 22.11 kg/m     Reproductive/Obstetrics negative OB ROS                             Anesthesia Physical Anesthesia Plan  ASA: 3 and emergent  Anesthesia Plan: General   Post-op Pain Management:    Induction:   PONV Risk Score and Plan:   Airway Management Planned:   Additional Equipment:   Intra-op Plan:   Post-operative Plan:   Informed  Consent: I have reviewed the patients History and Physical, chart, labs and discussed the procedure including the risks, benefits and alternatives for the proposed anesthesia with the patient or authorized representative who has indicated his/her understanding and acceptance.     Dental Advisory Given  Plan Discussed with: CRNA  Anesthesia Plan Comments:         Anesthesia Quick Evaluation

## 2022-06-08 NOTE — Anesthesia Postprocedure Evaluation (Signed)
Anesthesia Post Note  Patient: Brian Key  Procedure(s) Performed: COLONOSCOPY  Patient location during evaluation: Endoscopy Anesthesia Type: General Level of consciousness: awake and alert Pain management: pain level controlled Vital Signs Assessment: post-procedure vital signs reviewed and stable Respiratory status: spontaneous breathing, nonlabored ventilation, respiratory function stable and patient connected to nasal cannula oxygen Cardiovascular status: blood pressure returned to baseline and stable Postop Assessment: no apparent nausea or vomiting Anesthetic complications: no   No notable events documented.   Last Vitals:  Vitals:   06/08/22 1634 06/08/22 2113  BP:  (!) 162/71  Pulse:  91  Resp: 20 20  Temp:    SpO2:  100%    Last Pain:  Vitals:   06/08/22 2113  TempSrc:   PainSc: 0-No pain                 Martha Clan

## 2022-06-08 NOTE — Progress Notes (Signed)
NuLYTELY was obtained from pharmacy at 2150 last night, pt drank 3 glasses and said he couldn't tolerate anymore around 1:00 am and very weak, stools are still maroon dark with clots. HGB 8.1 this morning, message sent to Dr Alice Reichert.

## 2022-06-09 ENCOUNTER — Inpatient Hospital Stay: Payer: Medicare HMO | Admitting: Anesthesiology

## 2022-06-09 ENCOUNTER — Inpatient Hospital Stay: Payer: Medicare HMO

## 2022-06-09 ENCOUNTER — Encounter: Payer: Self-pay | Admitting: Internal Medicine

## 2022-06-09 ENCOUNTER — Encounter
Admission: EM | Disposition: A | Discharge status: Home / Self Care | Payer: Self-pay | Source: Home / Self Care | Attending: Hospitalist

## 2022-06-09 DIAGNOSIS — J441 Chronic obstructive pulmonary disease with (acute) exacerbation: Secondary | ICD-10-CM | POA: Diagnosis not present

## 2022-06-09 HISTORY — PX: ESOPHAGOGASTRODUODENOSCOPY: SHX5428

## 2022-06-09 LAB — TYPE AND SCREEN
ABO/RH(D): O POS
Antibody Screen: NEGATIVE
Unit division: 0
Unit division: 0

## 2022-06-09 LAB — BPAM RBC
Blood Product Expiration Date: 202310152359
Blood Product Expiration Date: 202311172359
ISSUE DATE / TIME: 202310151407
ISSUE DATE / TIME: 202310161221
Unit Type and Rh: 5100
Unit Type and Rh: 9500

## 2022-06-09 LAB — CBC
HCT: 22.3 % — ABNORMAL LOW (ref 39.0–52.0)
Hemoglobin: 7.5 g/dL — ABNORMAL LOW (ref 13.0–17.0)
MCH: 31.9 pg (ref 26.0–34.0)
MCHC: 33.6 g/dL (ref 30.0–36.0)
MCV: 94.9 fL (ref 80.0–100.0)
Platelets: 219 10*3/uL (ref 150–400)
RBC: 2.35 MIL/uL — ABNORMAL LOW (ref 4.22–5.81)
RDW: 15.6 % — ABNORMAL HIGH (ref 11.5–15.5)
WBC: 27.4 10*3/uL — ABNORMAL HIGH (ref 4.0–10.5)
nRBC: 6.6 % — ABNORMAL HIGH (ref 0.0–0.2)

## 2022-06-09 LAB — BASIC METABOLIC PANEL
Anion gap: 11 (ref 5–15)
BUN: 59 mg/dL — ABNORMAL HIGH (ref 8–23)
CO2: 20 mmol/L — ABNORMAL LOW (ref 22–32)
Calcium: 7.8 mg/dL — ABNORMAL LOW (ref 8.9–10.3)
Chloride: 112 mmol/L — ABNORMAL HIGH (ref 98–111)
Creatinine, Ser: 1.1 mg/dL (ref 0.61–1.24)
GFR, Estimated: >60 mL/min (ref >60)
Glucose, Bld: 175 mg/dL — ABNORMAL HIGH (ref 70–99)
Potassium: 5.3 mmol/L — ABNORMAL HIGH (ref 3.5–5.1)
Sodium: 143 mmol/L (ref 135–145)

## 2022-06-09 LAB — GLUCOSE, CAPILLARY
Glucose-Capillary: 180 mg/dL — ABNORMAL HIGH (ref 70–99)
Glucose-Capillary: 172 mg/dL — ABNORMAL HIGH (ref 70–99)

## 2022-06-09 LAB — MAGNESIUM: Magnesium: 3 mg/dL — ABNORMAL HIGH (ref 1.7–2.4)

## 2022-06-09 LAB — HEMOGLOBIN AND HEMATOCRIT, BLOOD
HCT: 30.1 % — ABNORMAL LOW (ref 39.0–52.0)
Hemoglobin: 10.2 g/dL — ABNORMAL LOW (ref 13.0–17.0)

## 2022-06-09 LAB — PREPARE RBC (CROSSMATCH)

## 2022-06-09 LAB — MRSA NEXT GEN BY PCR, NASAL: MRSA by PCR Next Gen: NOT DETECTED

## 2022-06-09 SURGERY — EGD (ESOPHAGOGASTRODUODENOSCOPY)
Anesthesia: General

## 2022-06-09 MED ORDER — IOHEXOL 350 MG/ML SOLN
100.0000 mL | Freq: Once | INTRAVENOUS | Status: AC | PRN
  Administered 2022-06-09: 100 mL via INTRAVENOUS

## 2022-06-09 MED ORDER — CHLORHEXIDINE GLUCONATE CLOTH 2 % EX PADS
6.0000 | MEDICATED_PAD | Freq: Every day | CUTANEOUS | Status: DC
  Administered 2022-06-09 – 2022-06-11 (×2): 6 via TOPICAL

## 2022-06-09 MED ORDER — ORAL CARE MOUTH RINSE: 15.0000 mL | OROMUCOSAL | Status: DC | PRN

## 2022-06-09 MED ORDER — HALOPERIDOL LACTATE 5 MG/ML IJ SOLN
2.0000 mg | Freq: Once | INTRAMUSCULAR | Status: AC
  Administered 2022-06-09: 2 mg via INTRAVENOUS
  Filled 2022-06-09: qty 1

## 2022-06-09 MED ORDER — SODIUM CHLORIDE 0.9 % IV SOLN: INTRAVENOUS | Status: DC

## 2022-06-09 MED ORDER — SODIUM CHLORIDE 0.9% IV SOLUTION: Freq: Once | INTRAVENOUS | Status: AC

## 2022-06-09 MED ORDER — SODIUM CHLORIDE 0.9 % IV SOLN: INTRAVENOUS | Status: DC | PRN

## 2022-06-09 MED ORDER — HYDROMORPHONE HCL 1 MG/ML IJ SOLN
0.5000 mg | Freq: Once | INTRAMUSCULAR | Status: AC
  Administered 2022-06-09: 0.5 mg via INTRAVENOUS
  Filled 2022-06-09: qty 0.5

## 2022-06-09 MED ORDER — PROPOFOL 10 MG/ML IV BOLUS
INTRAVENOUS | Status: DC | PRN
  Administered 2022-06-09: 20 mg via INTRAVENOUS
  Administered 2022-06-09: 30 mg via INTRAVENOUS

## 2022-06-09 NOTE — Progress Notes (Signed)
  PROGRESS NOTE    Brian Key  ZOX:[id number] DOB: [birth year] DOA: 06/04/2022 PCP: Jonetta Osgood, NP  IC01A/IC01A-AA  LOS: 4 days   Brief hospital course:   Assessment & Plan: Brian Key is a 76 y.o.  male with medical history significant for COPD, extensive smoking hx who presented with severe respiratory distress.     * COPD with acute exacerbation ([place]) --presented with severe respiratory distress, was put on BiPAP.  Received 125 mg of Solu-Medrol, 2 g of magnesium and 2 DuoNebs on route. --CXR no acute finding.  No evidence of bacterial PNA.  Had flu-like symptoms PTA. --respiratory status much improved the next day, and on 2-3 L [place].  Weaned down to RA already. --completed a course of azithromycin Plan: --cont prednisone 40 mg daily, last day --cont scheduled DuoNeb --cont home bronchodilator  Current smoker --extensive smoking hx, started around age 15 or 29, used to be 3 packs per day but now down to 1 pack per day --cessation encouraged   Acute blood loss anemia --Hgb dropped from 17 to 11 after first bloody BM, and in the next few days dropped to as low as 7.8. --s/p 2u pRBC Plan: --2 units pRBC today given ongoing GI bleed --transfuse to keep Hgb >8  Acute GI bleeding --sudden onset on 10/13, BM with black stool and fresh blood, with associated abdominal pain.  CTA bleed scan didn't catch a source of bleeding, showed findings that suggest small bowel enteritis. --colonoscopy found no bleeding source. --more bloody BM's today Plan: --EGD today found no bleeding source --plan for video capsule study --transfer to stepdown in case pt continues to bleed leading to hemorrhage shock  Benign prostatic hyperplasia Continue patient on tamsulosin.   DVT prophylaxis: SCD/Compression stockings Code Status: Full code  Family Communication:  Level of care: Stepdown Dispo:   The patient is from: home Anticipated d/c is to: home Anticipated d/c date is:  > 3 days.  Continued GI bleeding, still haven't found bleeding source.   Subjective and Interval History:  Pt was more confused and agitated this morning.  RN reported more large bloody BM.  STAT CT bleed scan showed no bleeding source.  2u pRBC given.  Pt went for EGD which didn't find source of bleeding either.  Transferred to stepdown.   Objective: Vitals:   06/09/22 1900 06/09/22 1910 06/09/22 1930 06/09/22 2000  BP: 129/65   (!) 126/54  Pulse: 79 74 86 73  Resp: '15 12 15 17  '$ Temp: 97.6 F (36.4 C)     TempSrc: Oral     SpO2: 100% 100% 97% 97%  Weight:      Height:        Intake/Output Summary (Last 24 hours) at 06/09/2022 2159 Last data filed at 06/09/2022 1910 Gross per 24 hour  Intake 837.5 ml  Output 1300 ml  Net -462.5 ml   Filed Weights   06/07/22 0842 06/09/22 1232 06/09/22 1601  Weight: 67.9 kg 64.5 kg 64.5 kg    Examination:   Constitutional: NAD, alert, oriented HEENT: conjunctivae and lids normal, EOMI CV: No cyanosis.   RESP: normal respiratory effort, on RA Neuro: II - XII grossly intact.   Psych: agitated mood and affect.     Data Reviewed: I have personally reviewed labs and imaging studies  Time spent: 60 minutes  Enzo Bi, MD [facility] If 7PM-7AM, please contact night-coverage 06/09/2022, 9:59 PM

## 2022-06-09 NOTE — Transfer of Care (Addendum)
Immediate Anesthesia Transfer of Care Note  Patient: KEIFFER PIPER  Procedure(s) Performed: ESOPHAGOGASTRODUODENOSCOPY (EGD)  Patient Location: PACU  Anesthesia Type:General  Level of Consciousness: awake  Airway & Oxygen Therapy: Patient Spontanous Breathing  Post-op Assessment: Report given to RN and Post -op Vital signs reviewed and stable  Post vital signs: Reviewed and stable  Last Vitals:  Vitals Value Taken Time  BP 103/59 06/09/22 1639  Temp 36.1 C 06/09/22 1636  Pulse 77 06/09/22 1639  Resp 12 06/09/22 1639  SpO2 100 % 06/09/22 1639  Vitals shown include unvalidated device data.  Last Pain:  Vitals:   06/09/22 1636  TempSrc: Temporal  PainSc: 0-No pain      Patients Stated Pain Goal: 0 (74/12/87 8676)  Complications: No notable events documented.

## 2022-06-09 NOTE — Op Note (Addendum)
[facility] Patient Name: Brian Key Procedure Date: 06/09/2022 4:20 PM MRN: [id number] Account #: [id number] Date of Birth: 1946/01/19 Admit Type: Inpatient Age: 76 Room: [facility] ENDO ROOM 4 Gender: Male Note Status: Supervisor Override Instrument Name: Altamese Cabal Endoscope [id number] Procedure:             Upper GI endoscopy Indications:           Acute post hemorrhagic anemia, Hematochezia Providers:             Benay Pike. Kiano Terrien MD, MD Medicines:             Propofol per Anesthesia Complications:         No immediate complications. Procedure:             Pre-Anesthesia Assessment:                        - The risks and benefits of the procedure and the                         sedation options and risks were discussed with the                         patient. All questions were answered and informed                         consent was obtained.                        - Patient identification and proposed procedure were                         verified prior to the procedure by the nurse. The                         procedure was verified in the procedure room.                        - ASA Grade Assessment: III - A patient with severe                         systemic disease.                        - After reviewing the risks and benefits, the patient                         was deemed in satisfactory condition to undergo the                         procedure.                        After obtaining informed consent, the endoscope was                         passed under direct vision. Throughout the procedure,                         the patient's blood pressure, pulse, and oxygen  saturations were monitored continuously. The Endoscope                         was introduced through the mouth, and advanced to the                         third part of duodenum. The upper GI endoscopy was                         accomplished  without difficulty. The patient tolerated                         the procedure well. Findings:      The esophagus was normal.      Scattered mild inflammation characterized by erosions was found in the       gastric antrum.      The cardia and gastric fundus were normal on retroflexion.      The examined duodenum was normal. Impression:            - Normal esophagus.                        - Gastritis.                        - Normal examined duodenum.                        - No specimens collected. Recommendation:        - Patient has a contact number available for                         emergencies. The signs and symptoms of potential                         delayed complications were discussed with the patient.                         Return to normal activities tomorrow. Written                         discharge instructions were provided to the patient.                        - Resume previous diet.                        - Continue present medications.                        - Return patient to hospital ward for observation.                        - Perform a small bowel enteroscopy (SBE) tomorrow. Procedure Code(s):     --- Professional ---                        [phone], Esophagogastroduodenoscopy, flexible,                         transoral; diagnostic, including collection of  specimen(s) by brushing or washing, when performed                         (separate procedure) Diagnosis Code(s):     --- Professional ---                        K92.1, Melena (includes Hematochezia)                        D62, Acute posthemorrhagic anemia                        K29.70, Gastritis, unspecified, without bleeding CPT copyright 2022 American Medical Association. All rights reserved. The codes documented in this report are preliminary and upon coder review may  be revised to meet current compliance requirements. Efrain Sella MD, MD 06/09/2022 4:36:49 PM This  report has been signed electronically. Number of Addenda: 0 Note Initiated On: 06/09/2022 4:20 PM Estimated Blood Loss:  Estimated blood loss: none.      [facility]

## 2022-06-09 NOTE — Anesthesia Procedure Notes (Signed)
Date/Time: 06/09/2022 4:20 PM  Performed by: Johnna Acosta, CRNAPre-anesthesia Checklist: Patient identified, Emergency Drugs available, Suction available, Patient being monitored and Timeout performed Patient Re-evaluated:Patient Re-evaluated prior to induction Oxygen Delivery Method: Simple face mask Preoxygenation: Pre-oxygenation with 100% oxygen Induction Type: IV induction

## 2022-06-09 NOTE — Anesthesia Preprocedure Evaluation (Addendum)
Anesthesia Evaluation  Patient identified by MRN, date of birth, ID band Patient confused  General Assessment Comment:Slight confusion due to to haldol given during CT scan.  Reviewed: Allergy & Precautions, NPO status , Patient's Chart, lab work & pertinent test results  Airway Mallampati: III  TM Distance: >3 FB Neck ROM: full    Dental  (+) Edentulous Upper, Edentulous Lower   Pulmonary COPD, Current Smoker and Patient abstained from smoking.,    Pulmonary exam normal        Cardiovascular  + Systolic murmurs ECG 52/84/13:  Sinus tachycardia Ventricular bigeminy Borderline short PR interval Borderline ST depression, lateral leads   Neuro/Psych negative neurological ROS  negative psych ROS   GI/Hepatic Neg liver ROS, GIB   Endo/Other  negative endocrine ROSdiabetes, Type 2  Renal/GU negative Renal ROS   BPH negative genitourinary   Musculoskeletal   Abdominal Normal abdominal exam  (+)   Peds  Hematology negative hematology ROS (+) Blood dyscrasia, anemia ,   Anesthesia Other Findings GIB with repeat need for blood transfusions  Past Medical History: No date: Back problem No date: COPD (chronic obstructive pulmonary disease) (HCC) No date: Diabetes mellitus without complication ([place]) No date: Hyperlipidemia  Past Surgical History: 2009: ANKLE SURGERY 06/07/2022: COLONOSCOPY; N/A     Comment:  Procedure: COLONOSCOPY;  Surgeon: Toledo, Benay Pike, MD;              Location: [facility] ENDOSCOPY;  Service: Gastroenterology;                Laterality: N/A; 06/08/2022: COLONOSCOPY; N/A     Comment:  Procedure: COLONOSCOPY;  Surgeon: Toledo, Benay Pike, MD;              Location: [facility] ENDOSCOPY;  Service: Gastroenterology;                Laterality: N/A; 1978: HEMORRHOID SURGERY 2008: HERNIA REPAIR     Comment:  umbilical 24/40/1027: LIPOMA EXCISION     Comment:  back/ Dr Bary Castilla No date: NECK SURGERY 2007:  WRIST SURGERY; Left  BMI    Body Mass Index: 21.00 kg/m      Reproductive/Obstetrics negative OB ROS                          Anesthesia Physical Anesthesia Plan  ASA: 3  Anesthesia Plan: General   Post-op Pain Management:    Induction: Intravenous  PONV Risk Score and Plan: Propofol infusion and TIVA  Airway Management Planned: Natural Airway and Mask  Additional Equipment:   Intra-op Plan:   Post-operative Plan:   Informed Consent: I have reviewed the patients History and Physical, chart, labs and discussed the procedure including the risks, benefits and alternatives for the proposed anesthesia with the patient or authorized representative who has indicated his/her understanding and acceptance.     Dental Advisory Given and Consent reviewed with POA  Plan Discussed with: Anesthesiologist, CRNA and Surgeon  Anesthesia Plan Comments:       Anesthesia Quick Evaluation

## 2022-06-10 ENCOUNTER — Inpatient Hospital Stay: Payer: Medicare HMO | Admitting: Anesthesiology

## 2022-06-10 ENCOUNTER — Encounter
Admission: EM | Disposition: A | Discharge status: Home / Self Care | Payer: Self-pay | Source: Home / Self Care | Attending: Hospitalist

## 2022-06-10 ENCOUNTER — Encounter: Payer: Self-pay | Admitting: Internal Medicine

## 2022-06-10 ENCOUNTER — Telehealth: Payer: Medicare HMO

## 2022-06-10 DIAGNOSIS — E43 Unspecified severe protein-calorie malnutrition: Secondary | ICD-10-CM | POA: Insufficient documentation

## 2022-06-10 DIAGNOSIS — J441 Chronic obstructive pulmonary disease with (acute) exacerbation: Secondary | ICD-10-CM | POA: Diagnosis not present

## 2022-06-10 HISTORY — PX: ENTEROSCOPY: SHX5533

## 2022-06-10 LAB — CBC
HCT: 26.8 % — ABNORMAL LOW (ref 39.0–52.0)
Hemoglobin: 9.2 g/dL — ABNORMAL LOW (ref 13.0–17.0)
MCH: 31.4 pg (ref 26.0–34.0)
MCHC: 34.3 g/dL (ref 30.0–36.0)
MCV: 91.5 fL (ref 80.0–100.0)
Platelets: 176 10*3/uL (ref 150–400)
RBC: 2.93 MIL/uL — ABNORMAL LOW (ref 4.22–5.81)
RDW: 15.6 % — ABNORMAL HIGH (ref 11.5–15.5)
WBC: 28.9 10*3/uL — ABNORMAL HIGH (ref 4.0–10.5)
nRBC: 3.3 % — ABNORMAL HIGH (ref 0.0–0.2)

## 2022-06-10 LAB — TYPE AND SCREEN
ABO/RH(D): O POS
Antibody Screen: NEGATIVE
Unit division: 0
Unit division: 0

## 2022-06-10 LAB — BASIC METABOLIC PANEL
Anion gap: 5 (ref 5–15)
BUN: 53 mg/dL — ABNORMAL HIGH (ref 8–23)
CO2: 27 mmol/L (ref 22–32)
Calcium: 7.9 mg/dL — ABNORMAL LOW (ref 8.9–10.3)
Chloride: 110 mmol/L (ref 98–111)
Creatinine, Ser: 1.12 mg/dL (ref 0.61–1.24)
GFR, Estimated: >60 mL/min (ref >60)
Glucose, Bld: 146 mg/dL — ABNORMAL HIGH (ref 70–99)
Potassium: 5 mmol/L (ref 3.5–5.1)
Sodium: 142 mmol/L (ref 135–145)

## 2022-06-10 LAB — SURGICAL PATHOLOGY

## 2022-06-10 LAB — BPAM RBC
Blood Product Expiration Date: 202310172359
Blood Product Expiration Date: 202311182359
ISSUE DATE / TIME: 202310171105
ISSUE DATE / TIME: 202310171759
Unit Type and Rh: 5100
Unit Type and Rh: 9500

## 2022-06-10 LAB — MAGNESIUM: Magnesium: 2.9 mg/dL — ABNORMAL HIGH (ref 1.7–2.4)

## 2022-06-10 LAB — PATHOLOGIST SMEAR REVIEW

## 2022-06-10 LAB — GLUCOSE, CAPILLARY: Glucose-Capillary: 142 mg/dL — ABNORMAL HIGH (ref 70–99)

## 2022-06-10 LAB — HEMOGLOBIN AND HEMATOCRIT, BLOOD
HCT: 27.9 % — ABNORMAL LOW (ref 39.0–52.0)
Hemoglobin: 9.6 g/dL — ABNORMAL LOW (ref 13.0–17.0)

## 2022-06-10 SURGERY — ENTEROSCOPY
Anesthesia: General

## 2022-06-10 MED ORDER — SODIUM CHLORIDE 0.9 % IV SOLN: INTRAVENOUS | Status: DC | PRN

## 2022-06-10 MED ORDER — TAMSULOSIN HCL 0.4 MG PO CAPS
0.4000 mg | ORAL_CAPSULE | Freq: Every day | ORAL | Status: DC
  Administered 2022-06-11: 0.4 mg via ORAL
  Filled 2022-06-10: qty 1

## 2022-06-10 MED ORDER — ADULT MULTIVITAMIN W/MINERALS CH
1.0000 | ORAL_TABLET | Freq: Every day | ORAL | Status: DC
  Administered 2022-06-10 – 2022-06-11 (×2): 1 via ORAL
  Filled 2022-06-10 (×2): qty 1

## 2022-06-10 MED ORDER — PROPOFOL 500 MG/50ML IV EMUL
INTRAVENOUS | Status: DC | PRN
  Administered 2022-06-10: 125 ug/kg/min via INTRAVENOUS

## 2022-06-10 MED ORDER — PROPOFOL 10 MG/ML IV BOLUS
INTRAVENOUS | Status: DC | PRN
  Administered 2022-06-10: 40 mg via INTRAVENOUS

## 2022-06-10 NOTE — Progress Notes (Signed)
Per Dr. Posey Pronto patient may go down to procedure without nursing.

## 2022-06-10 NOTE — Anesthesia Postprocedure Evaluation (Signed)
Anesthesia Post Note  Patient: Brian Key  Procedure(s) Performed: ESOPHAGOGASTRODUODENOSCOPY (EGD)  Patient location during evaluation: ICU Anesthesia Type: General Level of consciousness: awake Respiratory status: spontaneous breathing Cardiovascular status: stable Anesthetic complications: no   No notable events documented.   Last Vitals:  Vitals:   06/10/22 0600 06/10/22 0700  BP: (!) 144/72 139/73  Pulse: 72 85  Resp: 14 (!) 26  Temp:  36.7 C  SpO2: 97% 93%    Last Pain:  Vitals:   06/10/22 0400  TempSrc: Oral  PainSc:                  Lerry Liner

## 2022-06-10 NOTE — Anesthesia Postprocedure Evaluation (Signed)
Anesthesia Post Note  Patient: Brian Key  Procedure(s) Performed: ENTEROSCOPY  Patient location during evaluation: Endoscopy Anesthesia Type: General Level of consciousness: awake and alert Pain management: pain level controlled Vital Signs Assessment: post-procedure vital signs reviewed and stable Respiratory status: spontaneous breathing, nonlabored ventilation, respiratory function stable and patient connected to nasal cannula oxygen Cardiovascular status: blood pressure returned to baseline and stable Postop Assessment: no apparent nausea or vomiting Anesthetic complications: no   No notable events documented.   Last Vitals:  Vitals:   06/10/22 1800 06/10/22 1900  BP: (!) 153/71 (!) 95/54  Pulse: 72 78  Resp: 19 20  Temp:  37 C  SpO2: 100% (!) 89%    Last Pain:  Vitals:   06/10/22 1900  TempSrc: Oral  PainSc:                  Dimas Millin

## 2022-06-10 NOTE — Progress Notes (Signed)
Nutrition Follow-up  DOCUMENTATION CODES:   Severe malnutrition in context of chronic illness  INTERVENTION:   -Once diet is advanced, resume:  -Boost Breeze po TID, each supplement provides 250 kcal and 9 grams of protein  -MVI with minerals daily  -If unable to advance diet, may need to consider nutrition support  NUTRITION DIAGNOSIS:   Severe Malnutrition related to chronic illness (COPD) as evidenced by severe fat depletion, severe muscle depletion.  Ongoing  GOAL:   Patient will meet greater than or equal to 90% of their needs  Progressing   MONITOR:   PO intake, Supplement acceptance, Diet advancement  REASON FOR ASSESSMENT:   Consult Assessment of nutrition requirement/status  ASSESSMENT:   76 y/o male with h/o Afib, DM, HLD, henria repair (2008) and COPD who is admitted with COPD exacerbation and GIB.  10/16- s/p colonoscopy- revealed one polyp in rectum (removed) 10/17- s/p EGD- revealed normal esophagus, gastritis  Reviewed I/O's: -1.4 L x 24 hours and -2 L since admission  UOP: 2.2 L x 24 hours  Case discussed with sitter; pt is NPO and is anxious to get his procedure done today.   Pt not interactive with this RD. Unable to provide additional history.   Pt currently NPO for video capsule study and SB enteroscopy.  Pt has been without adequate nutrition (NPO/ clear liquids) since hospitalization. Awaiting results of testing today. Given pt's malnutrition, may need to consider nutrition support if unable to advance diet. Per MD notes, EGD did not reveal any bleeding.  Medications reviewed and include thiamine.   Labs reviewed: CBGS: 142 (inpatient orders for glycemic control are none).    NUTRITION - FOCUSED PHYSICAL EXAM:  Flowsheet Row Most Recent Value  Orbital Region Severe depletion  Upper Arm Region Severe depletion  Thoracic and Lumbar Region Severe depletion  Buccal Region Severe depletion  Temple Region Severe depletion  Clavicle  Bone Region Severe depletion  Clavicle and Acromion Bone Region Severe depletion  Scapular Bone Region Severe depletion  Dorsal Hand Severe depletion  Patellar Region Severe depletion  Anterior Thigh Region Severe depletion  Posterior Calf Region Severe depletion  Edema (RD Assessment) None  Hair Reviewed  Eyes Reviewed  Mouth Reviewed  Skin Reviewed  Nails Reviewed       Diet Order:   Diet Order             Diet NPO time specified  Diet effective midnight                   EDUCATION NEEDS:   No education needs have been identified at this time  Skin:  Skin Assessment: Reviewed RN Assessment  Last BM:  06/09/22  Height:   Ht Readings from Last 1 Encounters:  06/09/22 '5\' 9"'$  (1.753 m)    Weight:   Wt Readings from Last 1 Encounters:  06/09/22 64.5 kg    Ideal Body Weight:  72.7 kg  BMI:  Body mass index is 21 kg/m.  Estimated Nutritional Needs:   Kcal:  1950-2150  Protein:  100-115 grams  Fluid:  > 1.9 L    Loistine Chance, RD, LDN, [place] Registered Dietitian II Certified Diabetes Care and Education Specialist Please refer to [facility] for RD and/or RD on-call/weekend/after hours pager

## 2022-06-10 NOTE — Progress Notes (Signed)
[place] [facility] [place] NAME: Brian Key    MR#:  [id number]  DATE OF BIRTH:  06/07/1946  SUBJECTIVE:  patient was continues last night. Required Haldol. Sitter in the room for patient safety. Was pulling out IV nine and EKG leads. No rectal bleed or dark blood he stool per RN. Patient is upset since he is NPO.    VITALS:  Blood pressure 118/64, pulse 64, temperature 98 F (36.7 C), resp. rate 16, height '5\' 9"'$  (1.753 m), weight 64.5 kg, SpO2 92 %.  PHYSICAL EXAMINATION:   GENERAL:  76 y.o.-year-old patient lying in the bed with no acute distress.  LUNGS: Normal breath sounds bilaterally, no wheezing CARDIOVASCULAR: S1, S2 normal. No murmurs,   ABDOMEN: Soft, nontender, nondistended. Bowel sounds present.  EXTREMITIES: No  edema b/l.    NEUROLOGIC: nonfocal  patient is alert and awake SKIN: No obvious rash, lesion, or ulcer.   LABORATORY PANEL:  CBC Recent Labs  Lab 06/10/22 0317 06/10/22 0854  WBC 28.9*  --   HGB 9.2* 9.6*  HCT 26.8* 27.9*  PLT 176  --     Chemistries  Recent Labs  Lab 06/04/22 2214 06/06/22 0701 06/10/22 0317  NA 143   < > 142  K 4.2   < > 5.0  CL 103   < > 110  CO2 29   < > 27  GLUCOSE 165*   < > 146*  BUN 22   < > 53*  CREATININE 0.91   < > 1.12  CALCIUM 9.0   < > 7.9*  MG  --    < > 2.9*  AST 29  --   --   ALT 26  --   --   ALKPHOS 68  --   --   BILITOT 0.6  --   --    < > = values in this interval not displayed.   Cardiac Enzymes No results for input(s): "TROPONINI" in the last 168 hours. RADIOLOGY:  CT ANGIO GI BLEED  Result Date: 06/09/2022 CLINICAL DATA:  Active GI bleeding. Bowel movement black stool and bright red blood. EXAM: CTA ABDOMEN AND PELVIS WITHOUT AND WITH CONTRAST TECHNIQUE: Multidetector CT imaging of the abdomen and pelvis was performed using the standard protocol during bolus administration of intravenous contrast. Multiplanar reconstructed images and MIPs were  obtained and reviewed to evaluate the vascular anatomy. RADIATION DOSE REDUCTION: This exam was performed according to the departmental dose-optimization program which includes automated exposure control, adjustment of the mA and/or kV according to patient size and/or use of iterative reconstruction technique. Per CT tech: Pt fighting/yelling and thrashing throughout ct scan, unable to follow directions, best images obtained. CONTRAST:  194m OMNIPAQUE IOHEXOL 350 MG/ML SOLN COMPARISON:  None Available. FINDINGS: VASCULAR Aorta: Normal caliber aorta without aneurysm, dissection, vasculitis or significant stenosis. Extensive intimal calcification and intraluminal thrombus. Celiac: Patent without evidence of aneurysm, dissection, vasculitis or significant stenosis. SMA: Extensive ostial calcification the origin of the SMA. No high-grade occlusion. Renals: Both renal arteries are patent without evidence of aneurysm, dissection, vasculitis, fibromuscular dysplasia or significant stenosis. IMA: Occluded by intramural thrombus Inflow: Occlusion of the LEFT common iliac artery by heavy intimal calcification. Some reconstitution distally at the femoral artery Proximal Outflow: Partial reconstitution of the LEFT femoral artery Veins: Unremarkable Review of the MIP images confirms the above findings. NON-VASCULAR Lower chest: Lung bases are clear. Hepatobiliary: No focal hepatic lesion. No biliary duct dilatation. Common bile  duct is normal. Pancreas: Pancreas is normal. No ductal dilatation. No pancreatic inflammation. Spleen: Normal spleen Adrenals/urinary tract: Adrenal glands and kidneys are normal. The ureters and bladder normal. The bladder is distended and extends the level the umbilicus measuring 15 cm in craniocaudad dimension Stomach/Bowel: No evidence of active contrast extravasation of the stomach duodenum. No active strap interview mental extravasation contrast into the small bowel. Small dependent high dense  material in the ascending colon is present on noncontrast imaging (image 91/5. Appendix normal. No evidence of active gastrointestinal bleeding in lumen of the ascending, transverse or descending colon arterial or venous imaging. Vascular/Lymphatic: No lymphadenopathy Reproductive: Prostate mildly enlarged. Other: No free fluid. Musculoskeletal: Bulky osteophytosis of the lumbar spine and sclerotic endplate change. IMPRESSION: VASCULAR 1. Heavy intimal calcification and intraluminal thrombus of the abdominal aorta. 2. Stenosis of the SMA with reconstitution. 3. IMA poorly demonstrated. 4. 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 NON-VASCULAR 1. No evidence of active gastrointestinal bleeding in the small bowel or colon. 2. Markedly distended bladder. Electronically Signed   By: Suzy Bouchard M.D.   On: 06/09/2022 12:37    Assessment and Plan  Brian Key is a 76 y.o.  male with medical history significant for COPD, extensive smoking hx who presented with severe respiratory distress.     COPD with acute exacerbation ([place]) --presented with severe respiratory distress, was put on BiPAP.  Received 125 mg of Solu-Medrol, 2 g of magnesium and 2 DuoNebs on route. --CXR no acute finding.  No evidence of bacterial PNA.  Had flu-like symptoms PTA. --respiratory status much improved the next day, and on 2-3 L [place].  Weaned down to RA already. --completed a course of azithromycin and steroid --cont scheduled DuoNeb --cont home bronchodilator   Current smoker --extensive smoking hx, started around age 62 or 40, used to be 3 packs per day but now down to 1 pack per day --cessation encouraged    Acute GI bleeding--source unknown Acute blood loss anemia --sudden onset on 10/13, BM with black stool and fresh blood, with associated abdominal pain.  CTA bleed scan didn't catch a source of bleeding, showed findings that suggest small bowel  enteritis. --colonoscopy found no bleeding source. --more bloody BM's today 10.18 --EGD found no bleeding source --plan for video capsule study and SB enteroscopy [er Dr Alice Reichert --s/p 4 unit BT so far --hgb 9.2   Benign prostatic hyperplasia Continue patient on tamsulosin.   DVT prophylaxis: SCD/Compression stockings Code Status: Full code  Family Communication:  Level of care: Stepdown Dispo:   The patient is from: home Anticipated d/c is to: home Anticipated d/c date is: > 3 days.  Continued GI bleeding, still haven't found bleeding source.       TOTAL TIME TAKING CARE OF THIS PATIENT: 35 minutes.  >50% time spent on counselling and coordination of care  Note: This dictation was prepared with Dragon dictation along with smaller phrase technology. Any transcriptional errors that result from this process are unintentional.  Fritzi Mandes M.D    [facility]   CC: Primary care physician; Jonetta Osgood, NP

## 2022-06-10 NOTE — Anesthesia Preprocedure Evaluation (Addendum)
Anesthesia Evaluation  Patient identified by MRN, date of birth, ID band Patient awake    Reviewed: Allergy & Precautions, H&P , NPO status , Patient's Chart, lab work & pertinent test results  History of Anesthesia Complications Negative for: history of anesthetic complications  Airway Mallampati: II   Neck ROM: full    Dental  (+) Poor Dentition   Pulmonary COPD, Current Smoker (1 ppd) and Patient abstained from smoking.,  Respiratory distress this admission, was on BiPAP but now on RA.   Pulmonary exam normal        Cardiovascular Exercise Tolerance: Poor Normal cardiovascular exam Rhythm:regular Rate:Normal  ECG 06/04/22:  Sinus tachycardia Ventricular bigeminy Borderline short PR interval Borderline ST depression, lateral leads   Neuro/Psych negative neurological ROS  negative psych ROS   GI/Hepatic negative GI ROS,   Endo/Other  negative endocrine ROSdiabetes, Well Controlled, Type 2  Renal/GU negative Renal ROS   BPH    Musculoskeletal   Abdominal   Peds  Hematology  (+) Blood dyscrasia, anemia ,   Anesthesia Other Findings    Reproductive/Obstetrics negative OB ROS                            Anesthesia Physical Anesthesia Plan  ASA: 3  Anesthesia Plan: General   Post-op Pain Management:    Induction:   PONV Risk Score and Plan:   Airway Management Planned:   Additional Equipment:   Intra-op Plan:   Post-operative Plan:   Informed Consent: I have reviewed the patients History and Physical, chart, labs and discussed the procedure including the risks, benefits and alternatives for the proposed anesthesia with the patient or authorized representative who has indicated his/her understanding and acceptance.     Dental Advisory Given  Plan Discussed with: CRNA  Anesthesia Plan Comments:         Anesthesia Quick Evaluation

## 2022-06-10 NOTE — Op Note (Addendum)
[facility] Gastroenterology Patient Name: Brian Key Procedure Date: 06/10/2022 3:46 PM MRN: [id number] Account #: [id number] Date of Birth: 20-May-1946 Admit Type: Inpatient Age: 76 Room: [facility] ENDO ROOM 2 Gender: Male Note Status: Supervisor Override Instrument Name: Peds Colonoscope [id number] Procedure:             Small bowel enteroscopy Indications:           GI bleeding source not documented by previous EGD and                         colonoscopy Providers:             Benay Pike. Emon Lance MD, MD Medicines:             Propofol per Anesthesia Complications:         No immediate complications. Estimated blood loss: None. Procedure:             Pre-Anesthesia Assessment:                        - The risks and benefits of the procedure and the                         sedation options and risks were discussed with the                         patient. All questions were answered and informed                         consent was obtained.                        - Patient identification and proposed procedure were                         verified prior to the procedure by the nurse. The                         procedure was verified in the procedure room.                        - ASA Grade Assessment: III - A patient with severe                         systemic disease.                        - After reviewing the risks and benefits, the patient                         was deemed in satisfactory condition to undergo the                         procedure.                        After obtaining informed consent, the endoscope was                         passed under direct vision. Throughout the procedure,  the patient's blood pressure, pulse, and oxygen                         saturations were monitored continuously. The                         Colonoscope was introduced through the mouth and                         advanced to the mid-jejunum.  The small bowel                         enteroscopy was accomplished without difficulty. The                         patient tolerated the procedure well. Findings:      One non-bleeding cratered duodenal ulcer with no stigmata of bleeding       was found in the duodenal bulb. The lesion was 10 mm in largest       dimension. Estimated blood loss: none.      A single angioectasia with no bleeding was found in the proximal       jejunum. Coagulation for bleeding prevention using argon plasma at 0.8       liters/minute and 20 watts was successful. Estimated blood loss: none.      Exam of the jejunum was otherwise normal. Impression:            - Non-bleeding duodenal ulcer with no stigmata of                         bleeding.                        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                         Treated with argon plasma coagulation (APC).                        - No specimens collected. Recommendation:        - Observe patient in hospital room for observation.                        - Advance diet as tolerated.                        - H pylori stool Ag                        Consider capsule endoscopy for any rebleeding.                        - Serial H/H, Serial exams                        - GI service will continue to follow the patient's                         progress and closely observe for any changes in  clinical status. 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 All rights reserved. The codes documented in this report are preliminary and upon coder review may  be revised to meet current compliance requirements. Efrain Sella MD, MD 06/10/2022 4:23:10 PM This report has been signed electronically. Number of Addenda: 0 Note Initiated On: 06/10/2022 3:46 PM Estimated Blood Loss:  Estimated blood loss: none.      [facility]

## 2022-06-10 NOTE — Transfer of Care (Signed)
Immediate Anesthesia Transfer of Care Note  Patient: Brian Key  Procedure(s) Performed: ENTEROSCOPY  Patient Location: PACU and Endoscopy Unit  Anesthesia Type:General  Level of Consciousness: drowsy and patient cooperative  Airway & Oxygen Therapy: Patient Spontanous Breathing  Post-op Assessment: Report given to RN and Post -op Vital signs reviewed and stable  Post vital signs: Reviewed and stable  Last Vitals:  Vitals Value Taken Time  BP 115/62 06/10/22 1615  Temp 36.1 C 06/10/22 1614  Pulse 68 06/10/22 1619  Resp 23 06/10/22 1619  SpO2 96 % 06/10/22 1619  Vitals shown include unvalidated device data.  Last Pain:  Vitals:   06/10/22 1614  TempSrc: Temporal  PainSc:       Patients Stated Pain Goal: 0 (06/24/27 1188)  Complications: No notable events documented.

## 2022-06-11 ENCOUNTER — Encounter: Payer: Self-pay | Admitting: Internal Medicine

## 2022-06-11 DIAGNOSIS — D62 Acute posthemorrhagic anemia: Secondary | ICD-10-CM | POA: Diagnosis not present

## 2022-06-11 DIAGNOSIS — J441 Chronic obstructive pulmonary disease with (acute) exacerbation: Secondary | ICD-10-CM | POA: Diagnosis not present

## 2022-06-11 DIAGNOSIS — E43 Unspecified severe protein-calorie malnutrition: Secondary | ICD-10-CM

## 2022-06-11 DIAGNOSIS — K922 Gastrointestinal hemorrhage, unspecified: Secondary | ICD-10-CM

## 2022-06-11 LAB — HEMOGLOBIN: Hemoglobin: 9.2 g/dL — ABNORMAL LOW (ref 13.0–17.0)

## 2022-06-11 MED ORDER — PROSOURCE PLUS PO LIQD: 30.0000 mL | Freq: Three times a day (TID) | ORAL | 1 refills | Status: AC

## 2022-06-11 MED ORDER — ADULT MULTIVITAMIN W/MINERALS CH: 1.0000 | ORAL_TABLET | Freq: Every day | ORAL | 0 refills | Status: AC

## 2022-06-11 MED ORDER — ENSURE ENLIVE PO LIQD: 237.0000 mL | Freq: Three times a day (TID) | ORAL | 12 refills | Status: AC

## 2022-06-11 MED ORDER — ENSURE ENLIVE PO LIQD
237.0000 mL | Freq: Three times a day (TID) | ORAL | Status: DC
  Administered 2022-06-11: 237 mL via ORAL

## 2022-06-11 MED ORDER — PROSOURCE PLUS PO LIQD
30.0000 mL | Freq: Three times a day (TID) | ORAL | Status: DC
  Administered 2022-06-11: 30 mL via ORAL

## 2022-06-11 MED ORDER — PANTOPRAZOLE SODIUM 40 MG PO TBEC: 40.0000 mg | DELAYED_RELEASE_TABLET | Freq: Every day | ORAL | 1 refills | Status: DC

## 2022-06-11 NOTE — Discharge Summary (Signed)
Physician Discharge Summary   Patient: Brian Key MRN: [id number] DOB: May 25, 1946  Admit date:     06/04/2022  Discharge date: 06/11/22  Discharge Physician: Brian Key   PCP: Brian Osgood, NP   Recommendations at discharge:   follow-up with PCP in 1 to 2 week follow-up G.I. Dr. Alice Key in 2 to 3 weeks  Discharge Diagnoses: Principal Problem:   COPD with acute exacerbation ([place]) Active Problems:   Benign prostatic hyperplasia   Respiratory distress   Acute GI bleeding   Acute blood loss anemia   Current smoker   Protein-calorie malnutrition, severe   Hospital Course: Brian Key is a 76 y.o.  male with medical history significant for COPD, extensive smoking hx who presented with severe respiratory distress.     COPD with acute exacerbation ([place]) --presented with severe respiratory distress, was put on BiPAP.  Received 125 mg of Solu-Medrol, 2 g of magnesium and 2 DuoNebs on route. --CXR no acute finding.  No evidence of bacterial PNA.  Had flu-like symptoms PTA. --respiratory status much improved the next day, and on 2-3 L [place].  Weaned down to RA already. --completed a course of azithromycin and steroid --cont scheduled DuoNeb --cont home bronchodilator   Current smoker --extensive smoking hx, started around age 25 or 13, used to be 3 packs per day but now down to 1 pack per day --cessation encouraged    Acute GI bleeding--source unknown Acute blood loss anemia --sudden onset on 10/13, BM with black stool and fresh blood, with associated abdominal pain.  CTA bleed scan didn't catch a source of bleeding, showed findings that suggest small bowel enteritis. --colonoscopy found no bleeding source. --more bloody BM's today 10.18 --EGD found no bleeding source --s/p 4 unit BT so far --hgb 9.2 today --SB enteroscopy showed Non bleeding duodenal ulcer and small jejunal AVM--cauterized -- video capsule endoscopy will be done as outpatient   Benign prostatic  hyperplasia on tamsulosin. D/c foley   Nutrition Status: Nutrition Problem: Severe Malnutrition Etiology: chronic illness (COPD) Signs/Symptoms: severe fat depletion, severe muscle depletion     overall hemodynamically stable.   DVT prophylaxis: SCD/Compression stockings Code Status: Full code  Family Communication: left message for wife today Level of care: Stepdown Dispo:   The patient is from: home Anticipated d/c is to: home Anticipated d/c date is: today  Ok From G.I. standpoint for discharge.       Consultants: G.I. Dr. Alice Key Procedures performed: EGD, colonoscopy, enteroscopy Disposition: Home Diet recommendation:  Cardiac diet DISCHARGE MEDICATION: Allergies as of 06/11/2022       Reactions   Bee Pollen Anaphylaxis   Oxycontin [oxycodone Hcl] Diarrhea   Nausea, severe GI upset   Penicillins Rash   Other reaction(s): Unknown        Medication List     STOP taking these medications    meloxicam 15 MG tablet Commonly known as: MOBIC       TAKE these medications    albuterol 108 (90 Base) MCG/ACT inhaler Commonly known as: VENTOLIN HFA Inhale 2 puffs into the lungs every 6 (six) hours as needed for wheezing or shortness of breath.   Anoro Ellipta 62.5-25 MCG/ACT Aepb Generic drug: umeclidinium-vilanterol Inhale 1 puff into the lungs daily.   EPINEPHrine 0.3 mg/0.3 mL Soaj injection Commonly known as: EpiPen 2-Pak Inject 0.3 mg into the muscle as needed for anaphylaxis.   feeding supplement Liqd Take 237 mLs by mouth 3 (three) times daily between meals.   (feeding supplement)  PROSource Plus liquid Take 30 mLs by mouth 3 (three) times daily between meals.   ipratropium-albuterol 0.5-2.5 (3) MG/3ML Soln Commonly known as: DUONEB Take 3 mLs by nebulization every 4 (four) hours as needed (wheezing/SOB/cough).   multivitamin with minerals Tabs tablet Take 1 tablet by mouth daily. Start taking on: June 12, 2022   pantoprazole 40 MG  tablet Commonly known as: Protonix Take 1 tablet (40 mg total) by mouth daily.   tamsulosin 0.4 MG Caps capsule Commonly known as: FLOMAX TAKE 1 CAPSULE BY MOUTH ONCE DAILY 30 MINUTES AFTER LARGEST MEAL.        Follow-up Information     [place], Brian Pike, MD. Schedule an appointment as soon as possible for a visit in 1 week(s).   Specialty: Gastroenterology Contact information: [place] [address] [phone]         Brian Osgood, NP Follow up in 1 week(s).   Specialty: Nurse Practitioner Why: hospital f/u Contact information: [place] [address] [phone]                Discharge Exam: Brian Key Weights   06/07/22 2423 06/09/22 1232 06/09/22 1601  Weight: 67.9 kg 64.5 kg 64.5 kg     Condition at discharge: fair  The results of significant diagnostics from this hospitalization (including imaging, microbiology, ancillary and laboratory) are listed below for reference.   Imaging Studies: CT ANGIO GI BLEED  Result Date: 06/09/2022 CLINICAL DATA:  Active GI bleeding. Bowel movement black stool and bright red blood. EXAM: CTA ABDOMEN AND PELVIS WITHOUT AND WITH CONTRAST TECHNIQUE: Multidetector CT imaging of the abdomen and pelvis was performed using the standard protocol during bolus administration of intravenous contrast. Multiplanar reconstructed images and MIPs were obtained and reviewed to evaluate the vascular anatomy. RADIATION DOSE REDUCTION: This exam was performed according to the departmental dose-optimization program which includes automated exposure control, adjustment of the mA and/or kV according to patient size and/or use of iterative reconstruction technique. Per CT tech: Pt fighting/yelling and thrashing throughout ct scan, unable to follow directions, best images obtained. CONTRAST:  16m OMNIPAQUE IOHEXOL 350 MG/ML SOLN COMPARISON:  None Available. FINDINGS: VASCULAR Aorta: Normal caliber aorta  without aneurysm, dissection, vasculitis or significant stenosis. Extensive intimal calcification and intraluminal thrombus. Celiac: Patent without evidence of aneurysm, dissection, vasculitis or significant stenosis. SMA: Extensive ostial calcification the origin of the SMA. No high-grade occlusion. Renals: Both renal arteries are patent without evidence of aneurysm, dissection, vasculitis, fibromuscular dysplasia or significant stenosis. IMA: Occluded by intramural thrombus Inflow: Occlusion of the LEFT common iliac artery by heavy intimal calcification. Some reconstitution distally at the femoral artery Proximal Outflow: Partial reconstitution of the LEFT femoral artery Veins: Unremarkable Review of the MIP images confirms the above findings. NON-VASCULAR Lower chest: Lung bases are clear. Hepatobiliary: No focal hepatic lesion. No biliary duct dilatation. Common bile duct is normal. Pancreas: Pancreas is normal. No ductal dilatation. No pancreatic inflammation. Spleen: Normal spleen Adrenals/urinary tract: Adrenal glands and kidneys are normal. The ureters and bladder normal. The bladder is distended and extends the level the umbilicus measuring 15 cm in craniocaudad dimension Stomach/Bowel: No evidence of active contrast extravasation of the stomach duodenum. No active strap interview mental extravasation contrast into the small bowel. Small dependent high dense material in the ascending colon is present on noncontrast imaging (image 91/5. Appendix normal. No evidence of active gastrointestinal bleeding in lumen of the ascending, transverse or descending colon arterial or venous imaging. Vascular/Lymphatic: No  lymphadenopathy Reproductive: Prostate mildly enlarged. Other: No free fluid. Musculoskeletal: Bulky osteophytosis of the lumbar spine and sclerotic endplate change. IMPRESSION: VASCULAR 1. Heavy intimal calcification and intraluminal thrombus of the abdominal aorta. 2. Stenosis of the SMA with  reconstitution. 3. IMA poorly demonstrated. 4. 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 NON-VASCULAR 1. No evidence of active gastrointestinal bleeding in the small bowel or colon. 2. Markedly distended bladder. Electronically Signed   By: Suzy Bouchard M.D.   On: 06/09/2022 12:37   CT ANGIO GI BLEED  Result Date: 06/05/2022 CLINICAL DATA:  Bright red blood per rectum. EXAM: CTA ABDOMEN AND PELVIS WITHOUT AND WITH CONTRAST TECHNIQUE: Multidetector CT imaging of the abdomen and pelvis was performed using the standard protocol during bolus administration of intravenous contrast. Multiplanar reconstructed images and MIPs were obtained and reviewed to evaluate the vascular anatomy. RADIATION DOSE REDUCTION: This exam was performed according to the departmental dose-optimization program which includes automated exposure control, adjustment of the mA and/or kV according to patient size and/or use of iterative reconstruction technique. CONTRAST:  162m OMNIPAQUE IOHEXOL 350 MG/ML SOLN COMPARISON:  CT stone study 11/30/2014 FINDINGS: VASCULAR Aorta: Prominent diffuse aortic calcification. Aortic aneurysm measuring 3.3 cm diameter. Moderate mural thrombus. Lumen remains patent. Celiac: Calcification of the origin of the celiac axis with patency demonstrated. SMA: Calcification throughout the superior mesenteric artery with patency demonstrated. 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 Vessels appear patent and nephrograms are symmetrical. IMA: Origin appears occluded. Reconstitution of flow is demonstrated. Inflow: Severe calcifications demonstrated in the iliac arteries with probable moderate stenosis bilaterally. Proximal Outflow: Diffuse calcification.  Vessels remain patent. Veins: No obvious venous abnormality within the limitations of this arterial phase study. Review of the MIP images confirms  the above findings. NON-VASCULAR Lower chest: No acute abnormality. Hepatobiliary: No focal liver abnormality is seen. No gallstones, gallbladder wall thickening, or biliary dilatation. Pancreas: Unremarkable. No pancreatic ductal dilatation or surrounding inflammatory changes. Spleen: Normal in size without focal abnormality. Adrenals/Urinary Tract: Adrenal glands are unremarkable. Kidneys are normal, without renal calculi, focal lesion, or hydronephrosis. Bladder wall is diffusely thickened, possibly due to cystitis or under distention. Stomach/Bowel: Stomach, small bowel, and colon are not abnormally distended. No wall thickening or inflammatory changes are appreciated. Fluid-filled colon consistent with liquid stool. Small bowel wall appears mildly hyperemic diffusely but no focal areas of contrast extravasation are identified to suggest a focal site of bleeding. Changes may represent enteritis. Appendix is normal. Lymphatic: No significant lymphadenopathy. Reproductive: Prostate gland is enlarged. Other: No free air or free fluid in the abdomen. Abdominal wall musculature appears intact. Musculoskeletal: Degenerative changes in the spine. No destructive bone lesions. IMPRESSION: VASCULAR 1. 3.3 cm abdominal aortic aneurysm. Recommend follow-up every 3 years. Reference: J Am Coll Radiol 23785;88:502-774 2. Diffuse aortic atherosclerosis. Diffuse calcification of major abdominal vasculature. 3. No evidence of bowel obstruction. Liquid stool in the colon. Appendix is normal. 4. Small bowel wall appears diffusely hyperemic but no focal areas of contrast extravasation are seen. No focal site of active bleeding is demonstrated. Changes may represent enteritis. 5. Bladder wall is thickened, possibly indicating cystitis or may be due to under distention. NON-VASCULAR Electronically Signed   By: WLucienne CapersM.D.   On: 06/05/2022 17:54   DG Chest Port 1 View  Result Date: 06/04/2022 CLINICAL DATA:  Shortness of  breath. EXAM: PORTABLE CHEST 1 VIEW COMPARISON:  04/19/2022 FINDINGS:  Heart size and pulmonary vascularity are normal. Diffuse interstitial pattern to the lungs with peribronchial thickening suggesting chronic bronchitic changes. Similar appearance to previous study. No developing consolidation or airspace disease. No pleural effusions. No pneumothorax. Mediastinal contours appear intact. IMPRESSION: Interstitial pattern to the lungs suggesting bronchitic changes. No focal consolidation. No interval change. Electronically Signed   By: Lucienne Capers M.D.   On: 06/04/2022 22:43    Microbiology: Results for orders placed or performed during the hospital encounter of 06/04/22  Resp Panel by RT-PCR (Flu A&B, Covid) Anterior Nasal Swab     Status: None   Collection Time: 06/04/22 10:14 PM   Specimen: Anterior Nasal Swab  Result Value Ref Range Status   SARS Coronavirus 2 by RT PCR NEGATIVE NEGATIVE Final    Comment: (NOTE) SARS-CoV-2 target nucleic acids are NOT DETECTED.  The SARS-CoV-2 RNA is generally detectable in upper respiratory specimens during the acute phase of infection. The lowest concentration of SARS-CoV-2 viral copies this assay can detect is 138 copies/mL. A negative result does not preclude SARS-Cov-2 infection and should not be used as the sole basis for treatment or other patient management decisions. A negative result may occur with  improper specimen collection/handling, submission of specimen other than nasopharyngeal swab, presence of viral mutation(s) within the areas targeted by this assay, and inadequate number of viral copies(<138 copies/mL). A negative result must be combined with clinical observations, patient history, and epidemiological information. The expected result is Negative.  Fact Sheet for Patients:  EntrepreneurPulse.com.au  Fact Sheet for Healthcare Providers:  IncredibleEmployment.be  This test is no t yet  approved or cleared by the [place] FDA and  has been authorized for detection and/or diagnosis of SARS-CoV-2 by FDA under an Emergency Use Authorization (EUA). This EUA will remain  in effect (meaning this test can be used) for the duration of the COVID-19 declaration under Section 564(b)(1) of the Act, 21 U.S.C.section 360bbb-3(b)(1), unless the authorization is terminated  or revoked sooner.       Influenza A by PCR NEGATIVE NEGATIVE Final   Influenza B by PCR NEGATIVE NEGATIVE Final    Comment: (NOTE) The Xpert Xpress SARS-CoV-2/FLU/RSV plus assay is intended as an aid in the diagnosis of influenza from Nasopharyngeal swab specimens and should not be used as a sole basis for treatment. Nasal washings and aspirates are unacceptable for Xpert Xpress SARS-CoV-2/FLU/RSV testing.  Fact Sheet for Patients: EntrepreneurPulse.com.au  Fact Sheet for Healthcare Providers: IncredibleEmployment.be  This test is not yet approved or cleared by the [place] FDA and has been authorized for detection and/or diagnosis of SARS-CoV-2 by FDA under an Emergency Use Authorization (EUA). This EUA will remain in effect (meaning this test can be used) for the duration of the COVID-19 declaration under Section 564(b)(1) of the Act, 21 U.S.C. section 360bbb-3(b)(1), unless the authorization is terminated or revoked.  Performed at [facility], [place], [place] [address]   Respiratory (~20 pathogens) panel by PCR     Status: None   Collection Time: 06/05/22  8:30 PM   Specimen: Nasopharyngeal Swab; Respiratory  Result Value Ref Range Status   Adenovirus NOT DETECTED NOT DETECTED Final   Coronavirus 229E NOT DETECTED NOT DETECTED Final    Comment: (NOTE) The Coronavirus on the Respiratory Panel, DOES NOT test for the novel  Coronavirus (2019 nCoV)    Coronavirus HKU1 NOT DETECTED NOT DETECTED Final   Coronavirus NL63 NOT  DETECTED NOT DETECTED Final   Coronavirus OC43  NOT DETECTED NOT DETECTED Final   Metapneumovirus NOT DETECTED NOT DETECTED Final   Rhinovirus / Enterovirus NOT DETECTED NOT DETECTED Final   Influenza A NOT DETECTED NOT DETECTED Final   Influenza B NOT DETECTED NOT DETECTED Final   Parainfluenza Virus 1 NOT DETECTED NOT DETECTED Final   Parainfluenza Virus 2 NOT DETECTED NOT DETECTED Final   Parainfluenza Virus 3 NOT DETECTED NOT DETECTED Final   Parainfluenza Virus 4 NOT DETECTED NOT DETECTED Final   Respiratory Syncytial Virus NOT DETECTED NOT DETECTED Final   Bordetella pertussis NOT DETECTED NOT DETECTED Final   Bordetella Parapertussis NOT DETECTED NOT DETECTED Final   Chlamydophila pneumoniae NOT DETECTED NOT DETECTED Final   Mycoplasma pneumoniae NOT DETECTED NOT DETECTED Final    Comment: Performed at [facility], [place] [address] [place], [place] [address]  MRSA Next Gen by PCR, Nasal     Status: None   Collection Time: 06/09/22 12:43 PM   Specimen: Nasal Mucosa; Nasal Swab  Result Value Ref Range Status   MRSA by PCR Next Gen NOT DETECTED NOT DETECTED Final    Comment: (NOTE) The GeneXpert MRSA Assay (FDA approved for NASAL specimens only), is one component of a comprehensive MRSA colonization surveillance program. It is not intended to diagnose MRSA infection nor to guide or monitor treatment for MRSA infections. Test performance is not FDA approved in patients less than 32 years old. Performed at [facility], [address] [place], [place] [address]     Labs: CBC: Recent Labs  Lab 06/06/22 0701 06/06/22 2009 06/07/22 0502 06/07/22 1937 06/08/22 0524 06/09/22 0455 06/09/22 2012 06/10/22 0317 06/10/22 0854 06/11/22 0933  WBC 26.9*  --  27.3*  --  17.2* 27.4*  --  28.9*  --   --   HGB 9.1*   < > 7.8*   < > 8.1* 7.5* 10.2* 9.2* 9.6* 9.2*  HCT 27.6*   < > 23.5*   < > 24.0* 22.3* 30.1* 26.8* 27.9*  --   MCV 96.5  --  96.3  --  95.2 94.9  --   91.5  --   --   PLT 234  --  230  --  226 219  --  176  --   --    < > = values in this interval not displayed.   Basic Metabolic Panel: Recent Labs  Lab 06/06/22 0701 06/07/22 0502 06/08/22 0524 06/09/22 0455 06/10/22 0317  NA 138 141 143 143 142  K 5.1 4.6 4.2 5.3* 5.0  CL 107 107 110 112* 110  CO2 '23 26 27 '$ 20* 27  GLUCOSE 252* 155* 128* 175* 146*  BUN 58* 86* 58* 59* 53*  CREATININE 1.00 1.27* 1.05 1.10 1.12  CALCIUM 7.9* 8.0* 7.8* 7.8* 7.9*  MG 2.3 2.6* 2.7* 3.0* 2.9*   Liver Function Tests: Recent Labs  Lab 06/04/22 2214  AST 29  ALT 26  ALKPHOS 68  BILITOT 0.6  PROT 7.0  ALBUMIN 3.6   CBG: Recent Labs  Lab 06/05/22 1640 06/09/22 1035 06/09/22 1225 06/10/22 1444  GLUCAP 233* 180* 172* 142*    Discharge time spent: greater than 30 minutes.  Signed: Fritzi Mandes, MD [facility] 06/11/2022

## 2022-06-11 NOTE — Progress Notes (Signed)
[facility] NAME: Brian Key    MR#:  [id number]  DATE OF BIRTH:  1946-03-05  SUBJECTIVE:  Overall better RN reported dark BM with some bright blood. Pt tolerating po diet On RA No family at bedside   VITALS:  Blood pressure 130/63, pulse 67, temperature 98.6 F (37 C), temperature source Oral, resp. rate (!) 21, height '5\' 9"'$  (1.753 m), weight 64.5 kg, SpO2 96 %.  PHYSICAL EXAMINATION:   GENERAL:  76 y.o.-year-old patient lying in the bed with no acute distress. thin LUNGS: Normal breath sounds bilaterally, no wheezing CARDIOVASCULAR: S1, S2 normal. No murmurs,   ABDOMEN: Soft, nontender, nondistended. Bowel sounds present.  EXTREMITIES: No  edema b/l.    NEUROLOGIC: nonfocal  patient is alert and awake SKIN: No obvious rash, lesion, or ulcer.   LABORATORY PANEL:  CBC Recent Labs  Lab 06/10/22 0317 06/10/22 0854 06/11/22 0933  WBC 28.9*  --   --   HGB 9.2* 9.6* 9.2*  HCT 26.8* 27.9*  --   PLT 176  --   --      Chemistries  Recent Labs  Lab 06/04/22 2214 06/06/22 0701 06/10/22 0317  NA 143   < > 142  K 4.2   < > 5.0  CL 103   < > 110  CO2 29   < > 27  GLUCOSE 165*   < > 146*  BUN 22   < > 53*  CREATININE 0.91   < > 1.12  CALCIUM 9.0   < > 7.9*  MG  --    < > 2.9*  AST 29  --   --   ALT 26  --   --   ALKPHOS 68  --   --   BILITOT 0.6  --   --    < > = values in this interval not displayed.    Cardiac Enzymes No results for input(s): "TROPONINI" in the last 168 hours. RADIOLOGY:  CT ANGIO GI BLEED  Result Date: 06/09/2022 CLINICAL DATA:  Active GI bleeding. Bowel movement black stool and bright red blood. EXAM: CTA ABDOMEN AND PELVIS WITHOUT AND WITH CONTRAST TECHNIQUE: Multidetector CT imaging of the abdomen and pelvis was performed using the standard protocol during bolus administration of intravenous contrast. Multiplanar reconstructed images and MIPs were obtained and reviewed to evaluate  the vascular anatomy. RADIATION DOSE REDUCTION: This exam was performed according to the departmental dose-optimization program which includes automated exposure control, adjustment of the mA and/or kV according to patient size and/or use of iterative reconstruction technique. Per CT tech: Pt fighting/yelling and thrashing throughout ct scan, unable to follow directions, best images obtained. CONTRAST:  151m OMNIPAQUE IOHEXOL 350 MG/ML SOLN COMPARISON:  None Available. FINDINGS: VASCULAR Aorta: Normal caliber aorta without aneurysm, dissection, vasculitis or significant stenosis. Extensive intimal calcification and intraluminal thrombus. Celiac: Patent without evidence of aneurysm, dissection, vasculitis or significant stenosis. SMA: Extensive ostial calcification the origin of the SMA. No high-grade occlusion. Renals: Both renal arteries are patent without evidence of aneurysm, dissection, vasculitis, fibromuscular dysplasia or significant stenosis. IMA: Occluded by intramural thrombus Inflow: Occlusion of the LEFT common iliac artery by heavy intimal calcification. Some reconstitution distally at the femoral artery Proximal Outflow: Partial reconstitution of the LEFT femoral artery Veins: Unremarkable Review of the MIP images confirms the above findings. NON-VASCULAR Lower chest: Lung bases are clear. Hepatobiliary: No focal hepatic lesion. No biliary duct dilatation. Common bile duct is  normal. Pancreas: Pancreas is normal. No ductal dilatation. No pancreatic inflammation. Spleen: Normal spleen Adrenals/urinary tract: Adrenal glands and kidneys are normal. The ureters and bladder normal. The bladder is distended and extends the level the umbilicus measuring 15 cm in craniocaudad dimension Stomach/Bowel: No evidence of active contrast extravasation of the stomach duodenum. No active strap interview mental extravasation contrast into the small bowel. Small dependent high dense material in the ascending colon is  present on noncontrast imaging (image 91/5. Appendix normal. No evidence of active gastrointestinal bleeding in lumen of the ascending, transverse or descending colon arterial or venous imaging. Vascular/Lymphatic: No lymphadenopathy Reproductive: Prostate mildly enlarged. Other: No free fluid. Musculoskeletal: Bulky osteophytosis of the lumbar spine and sclerotic endplate change. IMPRESSION: VASCULAR 1. Heavy intimal calcification and intraluminal thrombus of the abdominal aorta. 2. Stenosis of the SMA with reconstitution. 3. IMA poorly demonstrated. 4. 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 NON-VASCULAR 1. No evidence of active gastrointestinal bleeding in the small bowel or colon. 2. Markedly distended bladder. Electronically Signed   By: Suzy Bouchard M.D.   On: 06/09/2022 12:37    Assessment and Plan  Brian Key is a 76 y.o.  male with medical history significant for COPD, extensive smoking hx who presented with severe respiratory distress.     COPD with acute exacerbation ([place]) --presented with severe respiratory distress, was put on BiPAP.  Received 125 mg of Solu-Medrol, 2 g of magnesium and 2 DuoNebs on route. --CXR no acute finding.  No evidence of bacterial PNA.  Had flu-like symptoms PTA. --respiratory status much improved the next day, and on 2-3 L [place].  Weaned down to RA already. --completed a course of azithromycin and steroid --cont scheduled DuoNeb --cont home bronchodilator   Current smoker --extensive smoking hx, started around age 32 or 85, used to be 3 packs per day but now down to 1 pack per day --cessation encouraged    Acute GI bleeding--source unknown Acute blood loss anemia --sudden onset on 10/13, BM with black stool and fresh blood, with associated abdominal pain.  CTA bleed scan didn't catch a source of bleeding, showed findings that suggest small bowel enteritis. --colonoscopy found no  bleeding source. --more bloody BM's today 10.18 --EGD found no bleeding source --plan for video capsule study and SB enteroscopy [er Dr Alice Reichert --s/p 4 unit BT so far --hgb 9.2 --SB enteroscopy showed Non bleeding duodenal ulcer and small jejunal AVM--cauterized   Benign prostatic hyperplasia on tamsulosin. D/c foley  Pt wants to stay another night. He had small BM with mixed dark and bright blood    DVT prophylaxis: SCD/Compression stockings Code Status: Full code  Family Communication:  Level of care: Stepdown Dispo:   The patient is from: home Anticipated d/c is to: home Anticipated d/c date is: likely 10/20      TOTAL TIME TAKING CARE OF THIS PATIENT: 35 minutes.  >50% time spent on counselling and coordination of care  Note: This dictation was prepared with Dragon dictation along with smaller phrase technology. Any transcriptional errors that result from this process are unintentional.  Fritzi Mandes M.D    [facility]   CC: Primary care physician; Jonetta Osgood, NP

## 2022-06-11 NOTE — Discharge Instructions (Signed)
Smoking cessation advised Avoid NSAIDs, BC powder, ASA like lmeds

## 2022-06-11 NOTE — Progress Notes (Signed)
Nutrition Follow-up  DOCUMENTATION CODES:   Severe malnutrition in context of chronic illness  INTERVENTION:   -D/c Boost Breeze -Ensure Enlive po TID, each supplement provides 350 kcal and 20 grams of protein -MVI with minerals daily -30 ml Prosource Plus TID, each supplement provides 100 kcals and 15 grams protein  NUTRITION DIAGNOSIS:   Severe Malnutrition related to chronic illness (COPD) as evidenced by severe fat depletion, severe muscle depletion.  Ongoing  GOAL:   Patient will meet greater than or equal to 90% of their needs  Progressing   MONITOR:   PO intake, Supplement acceptance, Diet advancement  REASON FOR ASSESSMENT:   Consult Assessment of nutrition requirement/status  ASSESSMENT:   76 y/o male with h/o Afib, DM, HLD, henria repair (2008) and COPD who is admitted with COPD exacerbation and GIB.  10/16- s/p colonoscopy- revealed one polyp in rectum (removed) 10/17- s/p EGD- revealed normal esophagus, gastritis 10/18- s/p small bowel enteroscopy- revealed non bleeding duodenal ulcer with no stigmata of bleeding (treated with APC); advanced to regular diet   Reviewed I/O's: -1.4 L x 24 hours and -3.4 L since admission  UOP: 1.5 L  24 hours  Pt sleeping soundly at time of visit. He did not respond to voice.   Noted pt was advanced to regular diet yesterday. Meal completions documented at 50%. Observed pt breakfast tray- pt consumed only 2 cartons of juice. Pt had not been taking the Boost Breeze supplements. RD will switch to Ensure due to increased nutritional density.   Labs reviewed: CBGS: 142 (inpatient orders for glycemic control are none).    Diet Order:   Diet Order             Diet regular Room service appropriate? Yes; Fluid consistency: Thin  Diet effective now                   EDUCATION NEEDS:   No education needs have been identified at this time  Skin:  Skin Assessment: Reviewed RN Assessment  Last BM:   06/09/22  Height:   Ht Readings from Last 1 Encounters:  06/09/22 '5\' 9"'$  (1.753 m)    Weight:   Wt Readings from Last 1 Encounters:  06/09/22 64.5 kg    Ideal Body Weight:  72.7 kg  BMI:  Body mass index is 21 kg/m.  Estimated Nutritional Needs:   Kcal:  1950-2150  Protein:  100-115 grams  Fluid:  > 1.9 L    Loistine Chance, RD, LDN, [place] Registered Dietitian II Certified Diabetes Care and Education Specialist Please refer to [facility] for RD and/or RD on-call/weekend/after hours pager

## 2022-06-15 ENCOUNTER — Encounter: Payer: Self-pay | Admitting: Nurse Practitioner

## 2022-06-15 ENCOUNTER — Ambulatory Visit (INDEPENDENT_AMBULATORY_CARE_PROVIDER_SITE_OTHER): Payer: Medicare HMO | Admitting: Nurse Practitioner

## 2022-06-15 VITALS — BP 140/62 | HR 75 | Temp 97.3°F | Resp 16 | Ht 69.0 in | Wt 146.4 lb

## 2022-06-15 DIAGNOSIS — Z8719 Personal history of other diseases of the digestive system: Secondary | ICD-10-CM

## 2022-06-15 DIAGNOSIS — J432 Centrilobular emphysema: Secondary | ICD-10-CM

## 2022-06-15 DIAGNOSIS — J441 Chronic obstructive pulmonary disease with (acute) exacerbation: Secondary | ICD-10-CM

## 2022-06-15 DIAGNOSIS — Z09 Encounter for follow-up examination after completed treatment for conditions other than malignant neoplasm: Secondary | ICD-10-CM | POA: Diagnosis not present

## 2022-06-15 MED ORDER — PREGABALIN 25 MG PO CAPS: 25.0000 mg | ORAL_CAPSULE | Freq: Two times a day (BID) | ORAL | 0 refills | Status: DC

## 2022-06-15 MED ORDER — PREGABALIN 50 MG PO CAPS: 50.0000 mg | ORAL_CAPSULE | Freq: Two times a day (BID) | ORAL | 1 refills | Status: DC

## 2022-06-15 MED ORDER — TIZANIDINE HCL 4 MG PO TABS: 4.0000 mg | ORAL_TABLET | Freq: Three times a day (TID) | ORAL | 0 refills | Status: DC | PRN

## 2022-06-15 NOTE — Progress Notes (Signed)
[facility] [address], [address] [place] [id number] [facility] Discharge Acute Issues Care Follow Up                                                                        Patient Demographics  Brian Key, is a 76 y.o. male  DOB 01-Jul-1946  MRN [id number].  Primary MD  Jonetta Osgood, NP   Reason for TCC follow Up - COPD exacerbation   Past Medical History:  Diagnosis Date   Back problem    COPD (chronic obstructive pulmonary disease) ([place])    Diabetes mellitus without complication ([place])    Hyperlipidemia     Past Surgical History:  Procedure Laterality Date   ANKLE SURGERY  2009   COLONOSCOPY N/A 06/07/2022   Procedure: COLONOSCOPY;  Surgeon: Toledo, Benay Pike, MD;  Location: [facility] ENDOSCOPY;  Service: Gastroenterology;  Laterality: N/A;   COLONOSCOPY N/A 06/08/2022   Procedure: COLONOSCOPY;  Surgeon: Toledo, Benay Pike, MD;  Location: [facility] ENDOSCOPY;  Service: Gastroenterology;  Laterality: N/A;   ENTEROSCOPY N/A 06/10/2022   Procedure: ENTEROSCOPY;  Surgeon: Toledo, Benay Pike, MD;  Location: [facility] ENDOSCOPY;  Service: Gastroenterology;  Laterality: N/A;   ESOPHAGOGASTRODUODENOSCOPY N/A 06/09/2022   Procedure: ESOPHAGOGASTRODUODENOSCOPY (EGD);  Surgeon: Toledo, Benay Pike, MD;  Location: [facility] ENDOSCOPY;  Service: Gastroenterology;  Laterality: N/A;   [place]   HERNIA REPAIR  [id number]   umbilical   LIPOMA EXCISION  02/16/2017   back/ Dr Bary Castilla   NECK SURGERY     WRIST SURGERY Left 2007       Recent HPI and [facility] Course: Brian Key is a 76 y.o.  male with medical history significant for COPD, extensive smoking hx who presented with severe respiratory distress.     COPD with acute exacerbation ([place]) --presented with severe respiratory distress, was put on BiPAP.  Received 125 mg of Solu-Medrol, 2 g of  magnesium and 2 DuoNebs on route. --CXR no acute finding.  No evidence of bacterial PNA.  Had flu-like symptoms PTA. --respiratory status much improved the next day, and on 2-3 L [place].  Weaned down to RA already. --completed a course of azithromycin and steroid --cont scheduled DuoNeb --cont home bronchodilator   Current smoker --extensive smoking hx, started around age 59 or 61, used to be 3 packs per day but now down to 1 pack per day --cessation encouraged    Acute GI bleeding--source unknown Acute blood loss anemia --sudden onset on 10/13, BM with black stool and fresh blood, with associated abdominal pain.  CTA bleed scan didn't catch a source of bleeding, showed findings that suggest small bowel enteritis. --colonoscopy found no bleeding source. --more bloody BM's today 10.18 --EGD found no bleeding source --s/p 4 unit BT so far --hgb 9.2 today --SB enteroscopy showed Non bleeding duodenal ulcer and small jejunal AVM--cauterized -- video capsule endoscopy will be done as outpatient   Benign prostatic hyperplasia on tamsulosin. D/c foley  Nutrition Status: Nutrition Problem: Severe Malnutrition Etiology: chronic illness (COPD) Signs/Symptoms: severe fat depletion, severe muscle depletion      Post Hospital Acute Care Issue to be followed in the Clinic   Principal Problem:   COPD with acute exacerbation ([place]) Active Problems:   Benign prostatic hyperplasia   Respiratory distress   Acute GI bleeding   Acute blood loss anemia   Current smoker   Protein-calorie malnutrition, severe     Subjective:   [name] today has, No headache, No chest pain, No abdominal pain - No Nausea, No new weakness tingling or numbness, No Cough - SOB. improved  Assessment & Plan   1. Hospital discharge follow-up Cannot take NSAIDs anymore due to GI bleed, could see about arthrotec - tiZANidine (ZANAFLEX) 4 MG tablet; Take 1 tablet (4 mg total) by mouth 3 (three) times daily as  needed for muscle spasms.  Dispense: 90 tablet; Refill: 0 - pregabalin (LYRICA) 25 MG capsule; Take 1 capsule (25 mg total) by mouth 2 (two) times daily.  Dispense: 60 capsule; Refill: 0  2. 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 COPD with acute exacerbation ([place]) stablized  4. History of GI bleed Treated in hospital with endoscopy and cauterization. No more NSAIDs   Reason for frequent admissions/ER visits    COPD Anaphylaxis Emphysema Chronic pain   Objective:   Vitals:   06/15/22 1401  BP: (!) 140/62  Pulse: 75  Resp: 16  Temp: (!) 97.3 F (36.3 C)  SpO2: 99%  Weight: 146 lb 6.4 oz (66.4 kg)  Height: '5\' 9"'$  (1.753 m)    Wt Readings from Last 3 Encounters:  06/15/22 146 lb 6.4 oz (66.4 kg)  06/09/22 142 lb 3.2 oz (64.5 kg)  06/02/22 149 lb 9.6 oz (67.9 kg)    Allergies as of 06/15/2022       Reactions   Bee Pollen Anaphylaxis   Oxycontin [oxycodone Hcl] Diarrhea   Nausea, severe GI upset   Penicillins Rash   Other reaction(s): Unknown        Medication List        Accurate as of June 15, 2022 11:59 PM. If you have any questions, ask your nurse or doctor.          albuterol 108 (90 Base) MCG/ACT inhaler Commonly known as: VENTOLIN HFA Inhale 2 puffs into the lungs every 6 (six) hours as needed for wheezing or shortness of breath.   Anoro Ellipta 62.5-25 MCG/ACT Aepb Generic drug: umeclidinium-vilanterol Inhale 1 puff into the lungs daily.   EPINEPHrine 0.3 mg/0.3 mL Soaj injection Commonly known as: EpiPen 2-Pak Inject 0.3 mg into the muscle as needed for anaphylaxis.   feeding supplement Liqd Take 237 mLs by mouth 3 (three) times daily between meals.   (feeding supplement) PROSource Plus liquid Take 30 mLs by mouth 3 (three) times daily between meals.   ipratropium-albuterol 0.5-2.5 (3) MG/3ML Soln Commonly known as: DUONEB Take 3 mLs by nebulization every 4 (four) hours as needed (wheezing/SOB/cough).   multivitamin with  minerals Tabs tablet Take 1 tablet by mouth daily.   pantoprazole 40 MG tablet Commonly known as: Protonix Take 1 tablet (40 mg total) by mouth daily.   pregabalin 25 MG capsule Commonly known as: LYRICA Take 1 capsule (25 mg total) by mouth 2 (two) times daily. Started by: Jonetta Osgood, NP   tamsulosin 0.4 MG Caps capsule Commonly known as: FLOMAX TAKE 1 CAPSULE BY MOUTH ONCE DAILY 30 MINUTES AFTER LARGEST MEAL.  tiZANidine 4 MG tablet Commonly known as: ZANAFLEX Take 1 tablet (4 mg total) by mouth 3 (three) times daily as needed for muscle spasms. Started by: Jonetta Osgood, NP         Physical Exam: Constitutional: Patient appears well-developed and well-nourished. Not in obvious distress. HENT: Normocephalic, atraumatic, External right and left ear normal. Oropharynx is clear and moist.  Eyes: Conjunctivae and EOM are normal. PERRLA, no scleral icterus. Neck: Normal ROM. Neck supple. No JVD. No tracheal deviation. No thyromegaly. CVS: RRR, S1/S2 +, no murmurs, no gallops, no carotid bruit.  Pulmonary: Effort and breath sounds normal, no stridor, rhonchi, wheezes, rales.  Abdominal: Soft. BS +, no distension, tenderness, rebound or guarding.  Musculoskeletal: Normal range of motion. No edema and no tenderness.  Lymphadenopathy: No lymphadenopathy noted, cervical, inguinal or axillary Neuro: Alert. Normal reflexes, muscle tone coordination. No cranial nerve deficit. Skin: Skin is warm and dry. No rash noted. Not diaphoretic. No erythema. No pallor. Psychiatric: Normal mood and affect. Behavior, judgment, thought content normal.   Data Review   Micro Results No results found for this or any previous visit (from the past 240 hour(s)).    CBC No results for input(s): "WBC", "HGB", "HCT", "PLT", "MCV", "MCH", "MCHC", "RDW", "LYMPHSABS", "MONOABS", "EOSABS", "BASOSABS", "BANDABS" in the last 168 hours.  Invalid input(s): "NEUTRABS", "BANDSABD"   Chemistries   No results for input(s): "NA", "K", "CL", "CO2", "GLUCOSE", "BUN", "CREATININE", "CALCIUM", "MG", "AST", "ALT", "ALKPHOS", "BILITOT" in the last 168 hours.  Invalid input(s): "GFRCGP"  ------------------------------------------------------------------------------------------------------------------ CrCl cannot be calculated (Patient's most recent lab result is older than the maximum 21 days allowed.). ------------------------------------------------------------------------------------------------------------------ No results for input(s): "HGBA1C" in the last 72 hours. ------------------------------------------------------------------------------------------------------------------ No results for input(s): "CHOL", "HDL", "LDLCALC", "TRIG", "CHOLHDL", "LDLDIRECT" in the last 72 hours. ------------------------------------------------------------------------------------------------------------------ No results for input(s): "TSH", "T4TOTAL", "T3FREE", "THYROIDAB" in the last 72 hours.  Invalid input(s): "FREET3" ------------------------------------------------------------------------------------------------------------------ No results for input(s): "VITAMINB12", "FOLATE", "FERRITIN", "TIBC", "IRON", "RETICCTPCT" in the last 72 hours.  Coagulation profile No results for input(s): "INR", "PROTIME" in the last 168 hours.  No results for input(s): "DDIMER" in the last 72 hours.  Cardiac Enzymes No results for input(s): "CKMB", "TROPONINI", "MYOGLOBIN" in the last 168 hours.  Invalid input(s): "CK" ------------------------------------------------------------------------------------------------------------------ Invalid input(s): "POCBNP"  Return in about 1 month (around 07/16/2022) for F/U, Daylani Deblois PCP.   Time Spent in minutes  45 Time spent with patient included reviewing progress notes, labs, imaging studies, and discussing plan for follow up.   This patient was seen by Jonetta Osgood, FNP-C in collaboration with Dr. Clayborn Bigness as a part of collaborative care agreement.   Jonetta Osgood MSN, FNP-C on 06/15/2022 at 11:38 AM   **Disclaimer: This note may have been dictated with voice recognition software. Similar sounding words can inadvertently be transcribed and this note may contain transcription errors which may not have been corrected upon publication of note.**

## 2022-06-17 ENCOUNTER — Telehealth: Payer: Self-pay

## 2022-06-17 NOTE — Telephone Encounter (Signed)
PA was approved for Pregabalin.

## 2022-06-19 ENCOUNTER — Telehealth: Payer: Self-pay

## 2022-06-30 ENCOUNTER — Telehealth: Payer: Self-pay

## 2022-06-30 DIAGNOSIS — D128 Benign neoplasm of rectum: Secondary | ICD-10-CM | POA: Diagnosis not present

## 2022-06-30 DIAGNOSIS — K264 Chronic or unspecified duodenal ulcer with hemorrhage: Secondary | ICD-10-CM | POA: Diagnosis not present

## 2022-06-30 DIAGNOSIS — K269 Duodenal ulcer, unspecified as acute or chronic, without hemorrhage or perforation: Secondary | ICD-10-CM | POA: Diagnosis not present

## 2022-06-30 NOTE — Telephone Encounter (Signed)
Patient's P.A. for Pregabalin has been approved.

## 2022-07-01 DIAGNOSIS — K269 Duodenal ulcer, unspecified as acute or chronic, without hemorrhage or perforation: Secondary | ICD-10-CM | POA: Diagnosis not present

## 2022-07-25 ENCOUNTER — Encounter: Payer: Self-pay | Admitting: Nurse Practitioner

## 2022-08-07 ENCOUNTER — Encounter: Payer: Self-pay | Admitting: Nurse Practitioner

## 2022-08-07 ENCOUNTER — Ambulatory Visit (INDEPENDENT_AMBULATORY_CARE_PROVIDER_SITE_OTHER): Payer: Medicare HMO | Admitting: Nurse Practitioner

## 2022-08-07 VITALS — BP 116/79 | HR 60 | Temp 98.3°F | Resp 16 | Ht 69.0 in | Wt 144.0 lb

## 2022-08-07 DIAGNOSIS — J432 Centrilobular emphysema: Secondary | ICD-10-CM

## 2022-08-07 DIAGNOSIS — G8929 Other chronic pain: Secondary | ICD-10-CM

## 2022-08-07 DIAGNOSIS — R7303 Prediabetes: Secondary | ICD-10-CM | POA: Diagnosis not present

## 2022-08-07 DIAGNOSIS — M5442 Lumbago with sciatica, left side: Secondary | ICD-10-CM | POA: Diagnosis not present

## 2022-08-07 DIAGNOSIS — M5441 Lumbago with sciatica, right side: Secondary | ICD-10-CM

## 2022-08-07 DIAGNOSIS — Z8719 Personal history of other diseases of the digestive system: Secondary | ICD-10-CM | POA: Diagnosis not present

## 2022-08-07 LAB — POCT GLYCOSYLATED HEMOGLOBIN (HGB A1C): Hemoglobin A1C: 5.8 % — AB (ref 4.0–5.6)

## 2022-08-07 MED ORDER — ANORO ELLIPTA 62.5-25 MCG/ACT IN AEPB: 1.0000 | INHALATION_SPRAY | Freq: Every day | RESPIRATORY_TRACT | 1 refills | Status: DC

## 2022-08-07 MED ORDER — DICLOFENAC-MISOPROSTOL 75-0.2 MG PO TBEC: 1.0000 | DELAYED_RELEASE_TABLET | Freq: Two times a day (BID) | ORAL | 2 refills | Status: DC

## 2022-08-07 MED ORDER — CYCLOBENZAPRINE HCL 10 MG PO TABS: 10.0000 mg | ORAL_TABLET | Freq: Every day | ORAL | 2 refills | Status: AC

## 2022-08-07 MED ORDER — PANTOPRAZOLE SODIUM 40 MG PO TBEC: 40.0000 mg | DELAYED_RELEASE_TABLET | Freq: Every day | ORAL | 1 refills | Status: DC

## 2022-08-07 NOTE — Progress Notes (Signed)
[facility] [place], [place] [address]  Internal MEDICINE  Office Visit Note  Patient Name: Brian Key  [id number]  [id number]  Date of Service: 08/07/2022  Chief Complaint  Patient presents with   Follow-up   Hyperlipidemia   Diabetes    HPI Brian Key presents for a follow up visit for chronic joint pain of multiple sites, arthritis, history of GI bleed, COPD and prediabetes Arthritis pain -- increase pain the more he is standing and walking, has sciatica, leg muscle weakness, faituge Gastric ulcer -- refill of pantoprazole and prevention with COPD -- takes anoro, doing well. Has not had any other serious or life-threatening respiratory episodes since his last office visit.  Prediabetes -- A1c is 5.8. up from 5.4 which was from 6 months ago.     Current Medication: Outpatient Encounter Medications as of 08/07/2022  Medication Sig   albuterol (VENTOLIN HFA) 108 (90 Base) MCG/ACT inhaler Inhale 2 puffs into the lungs every 6 (six) hours as needed for wheezing or shortness of breath.   cyclobenzaprine (FLEXERIL) 10 MG tablet Take 1 tablet (10 mg total) by mouth at bedtime.   Diclofenac-miSOPROStol 75-0.2 MG TBEC Take 1 tablet by mouth in the morning and at bedtime.   EPINEPHrine (EPIPEN 2-PAK) 0.3 mg/0.3 mL IJ SOAJ injection Inject 0.3 mg into the muscle as needed for anaphylaxis.   feeding supplement (ENSURE ENLIVE / ENSURE PLUS) LIQD Take 237 mLs by mouth 3 (three) times daily between meals.   ipratropium-albuterol (DUONEB) 0.5-2.5 (3) MG/3ML SOLN Take 3 mLs by nebulization every 4 (four) hours as needed (wheezing/SOB/cough).   Multiple Vitamin (MULTIVITAMIN WITH MINERALS) TABS tablet Take 1 tablet by mouth daily.   Nutritional Supplements (,FEEDING SUPPLEMENT, PROSOURCE PLUS) liquid Take 30 mLs by mouth 3 (three) times daily between meals.   tamsulosin (FLOMAX) 0.4 MG CAPS capsule TAKE 1 CAPSULE BY MOUTH ONCE DAILY 30 MINUTES AFTER LARGEST MEAL.    [DISCONTINUED] pantoprazole (PROTONIX) 40 MG tablet Take 1 tablet (40 mg total) by mouth daily.   [DISCONTINUED] pregabalin (LYRICA) 25 MG capsule Take 1 capsule (25 mg total) by mouth 2 (two) times daily.   [DISCONTINUED] tiZANidine (ZANAFLEX) 4 MG tablet Take 1 tablet (4 mg total) by mouth 3 (three) times daily as needed for muscle spasms.   [DISCONTINUED] umeclidinium-vilanterol (ANORO ELLIPTA) 62.5-25 MCG/ACT AEPB Inhale 1 puff into the lungs daily.   pantoprazole (PROTONIX) 40 MG tablet Take 1 tablet (40 mg total) by mouth daily.   umeclidinium-vilanterol (ANORO ELLIPTA) 62.5-25 MCG/ACT AEPB Inhale 1 puff into the lungs daily.   No facility-administered encounter medications on file as of 08/07/2022.    Surgical History: Past Surgical History:  Procedure Laterality Date   ANKLE SURGERY  2009   COLONOSCOPY N/A 06/07/2022   Procedure: COLONOSCOPY;  Surgeon: Toledo, Benay Pike, MD;  Location: [facility] ENDOSCOPY;  Service: Gastroenterology;  Laterality: N/A;   COLONOSCOPY N/A 06/08/2022   Procedure: COLONOSCOPY;  Surgeon: Toledo, Benay Pike, MD;  Location: [facility] ENDOSCOPY;  Service: Gastroenterology;  Laterality: N/A;   ENTEROSCOPY N/A 06/10/2022   Procedure: ENTEROSCOPY;  Surgeon: Toledo, Benay Pike, MD;  Location: [facility] ENDOSCOPY;  Service: Gastroenterology;  Laterality: N/A;   ESOPHAGOGASTRODUODENOSCOPY N/A 06/09/2022   Procedure: ESOPHAGOGASTRODUODENOSCOPY (EGD);  Surgeon: Toledo, Benay Pike, MD;  Location: [facility] ENDOSCOPY;  Service: Gastroenterology;  Laterality: N/A;   [place]   HERNIA REPAIR  2094   umbilical   LIPOMA EXCISION  02/16/2017   back/ Dr Bary Castilla   NECK  SURGERY     WRIST SURGERY Left 2007    Medical History: Past Medical History:  Diagnosis Date   Back problem    COPD (chronic obstructive pulmonary disease) ([place])    Diabetes mellitus without complication ([place])    Hyperlipidemia     Family History: Family History  Problem Relation Age of Onset    Heart disease Mother    COPD Father    Asthma Father    Prostate cancer Neg Hx    Bladder Cancer Neg Hx    Kidney cancer Neg Hx     Social History   Socioeconomic History   Marital status: Married    Spouse name: Not on file   Number of children: Not on file   Years of education: Not on file   Highest education level: Not on file  Occupational History   Not on file  Tobacco Use   Smoking status: Every Day    Packs/day: 1.00    Years: 50.00    Total pack years: 50.00    Types: Cigarettes   Smokeless tobacco: Never  Vaping Use   Vaping Use: Never used  Substance and Sexual Activity   Alcohol use: Yes    Comment: occasionally   Drug use: Not Currently    Types: Marijuana   Sexual activity: Yes    Birth control/protection: None  Other Topics Concern   Not on file  Social History Narrative   Not on file   Social Determinants of Health   Financial Resource Strain: Not on file  Food Insecurity: No Food Insecurity (06/05/2022)   Hunger Vital Sign    Worried About Running Out of Food in the Last Year: Never true    Ran Out of Food in the Last Year: Never true  Transportation Needs: No Transportation Needs (06/05/2022)   PRAPARE - Hydrologist (Medical): No    Lack of Transportation (Non-Medical): No  Physical Activity: Not on file  Stress: Not on file  Social Connections: Not on file  Intimate Partner Violence: Not At Risk (06/05/2022)   Humiliation, Afraid, Rape, and Kick questionnaire    Fear of Current or Ex-Partner: No    Emotionally Abused: No    Physically Abused: No    Sexually Abused: No      Review of Systems  Constitutional:  Positive for chills, fatigue and fever.  Respiratory:  Positive for cough, chest tightness, shortness of breath and wheezing.   Cardiovascular: Negative.  Negative for chest pain and palpitations.  Gastrointestinal:  Negative for constipation, diarrhea and nausea.  Musculoskeletal:  Positive for  arthralgias, back pain and myalgias.  Neurological:  Positive for headaches.    Vital Signs: BP 116/79   Pulse 60   Temp 98.3 F (36.8 C)   Resp 16   Ht '5\' 9"'$  (3.762 m)   Wt 144 lb (65.3 kg)   SpO2 98%   BMI 21.27 kg/m    Physical Exam Vitals reviewed.  Constitutional:      General: He is not in acute distress.    Appearance: Normal appearance. He is normal weight. He is not ill-appearing.  HENT:     Head: Normocephalic and atraumatic.  Eyes:     Pupils: Pupils are equal, round, and reactive to light.  Cardiovascular:     Rate and Rhythm: Normal rate and regular rhythm.     Heart sounds: Normal heart sounds. No murmur heard. Pulmonary:     Effort: Pulmonary effort is  normal. No respiratory distress.     Breath sounds: Normal breath sounds. No wheezing.  Neurological:     Mental Status: He is alert and oriented to person, place, and time.     Cranial Nerves: No cranial nerve deficit.     Coordination: Coordination normal.     Gait: Gait normal.  Psychiatric:        Mood and Affect: Mood normal.        Behavior: Behavior normal.        Assessment/Plan: 1. Chronic midline low back pain with bilateral sciatica Arthrotec prescribed to alleviate pain but prevent development of gastric ulcers or GI bleeding. Muscle relaxant prescribed to take at night for musculoskeletal pain. Follow up in 8 weeks to reassess pain and effectiveness.  - Diclofenac-miSOPROStol 75-0.2 MG TBEC; Take 1 tablet by mouth in the morning and at bedtime.  Dispense: 60 tablet; Refill: 2 - cyclobenzaprine (FLEXERIL) 10 MG tablet; Take 1 tablet (10 mg total) by mouth at bedtime.  Dispense: 30 tablet; Refill: 2  2. Prediabetes A1c is increased but stable at 5.8. no changes - POCT glycosylated hemoglobin (Hb A1C)  3. Centrilobular emphysema (HCC) Continue anoro ellipta as prescribed - umeclidinium-vilanterol (ANORO ELLIPTA) 62.5-25 MCG/ACT AEPB; Inhale 1 puff into the lungs daily.  Dispense: 180  each; Refill: 1  4. History of GI bleed Continue pantoprazole as prescribed - pantoprazole (PROTONIX) 40 MG tablet; Take 1 tablet (40 mg total) by mouth daily.  Dispense: 30 tablet; Refill: 1   General Counseling: Brian Key verbalizes understanding of the findings of todays visit and agrees with plan of treatment. I have discussed any further diagnostic evaluation that may be needed or ordered today. We also reviewed his medications today. he has been encouraged to call the office with any questions or concerns that should arise related to todays visit.    Orders Placed This Encounter  Procedures   POCT glycosylated hemoglobin (Hb A1C)    Meds ordered this encounter  Medications   Diclofenac-miSOPROStol 75-0.2 MG TBEC    Sig: Take 1 tablet by mouth in the morning and at bedtime.    Dispense:  60 tablet    Refill:  2   umeclidinium-vilanterol (ANORO ELLIPTA) 62.5-25 MCG/ACT AEPB    Sig: Inhale 1 puff into the lungs daily.    Dispense:  180 each    Refill:  1    For future refills   pantoprazole (PROTONIX) 40 MG tablet    Sig: Take 1 tablet (40 mg total) by mouth daily.    Dispense:  30 tablet    Refill:  1   cyclobenzaprine (FLEXERIL) 10 MG tablet    Sig: Take 1 tablet (10 mg total) by mouth at bedtime.    Dispense:  30 tablet    Refill:  2    New script, discontinue tizanidine    Return in about 8 weeks (around 10/02/2022) for F/U, eval new med, [facility] PCP.   Total time spent:30 Minutes Time spent includes review of chart, medications, test results, and follow up plan with the patient.   [place] Controlled Substance Database was reviewed by me.  This patient was seen by Jonetta Osgood, FNP-C in collaboration with Dr. Clayborn Bigness as a part of collaborative care agreement.   Jamas Jaquay R. Valetta Fuller, MSN, FNP-C Internal medicine

## 2022-08-10 ENCOUNTER — Telehealth: Payer: Self-pay

## 2022-08-10 NOTE — Telephone Encounter (Signed)
Send message to [employer]

## 2022-08-13 NOTE — Telephone Encounter (Signed)
done

## 2022-08-19 ENCOUNTER — Encounter: Payer: Self-pay | Admitting: Urology

## 2022-10-02 ENCOUNTER — Ambulatory Visit (INDEPENDENT_AMBULATORY_CARE_PROVIDER_SITE_OTHER): Payer: Medicare HMO | Admitting: Nurse Practitioner

## 2022-10-02 ENCOUNTER — Encounter: Payer: Self-pay | Admitting: Nurse Practitioner

## 2022-10-02 VITALS — BP 140/74 | HR 68 | Temp 97.3°F | Resp 16 | Ht 69.0 in | Wt 139.8 lb

## 2022-10-02 DIAGNOSIS — I10 Essential (primary) hypertension: Secondary | ICD-10-CM

## 2022-10-02 DIAGNOSIS — E782 Mixed hyperlipidemia: Secondary | ICD-10-CM | POA: Diagnosis not present

## 2022-10-02 DIAGNOSIS — G8929 Other chronic pain: Secondary | ICD-10-CM | POA: Diagnosis not present

## 2022-10-02 DIAGNOSIS — M5442 Lumbago with sciatica, left side: Secondary | ICD-10-CM

## 2022-10-02 DIAGNOSIS — J432 Centrilobular emphysema: Secondary | ICD-10-CM | POA: Diagnosis not present

## 2022-10-02 DIAGNOSIS — M5441 Lumbago with sciatica, right side: Secondary | ICD-10-CM

## 2022-10-02 NOTE — Progress Notes (Signed)
[facility] [place], [place] [address]  Internal MEDICINE  Office Visit Note  Patient Name: Brian Key  [id number]  [id number]  Date of Service: 10/02/2022  Chief Complaint  Patient presents with  . Follow-up  . Hyperlipidemia  . Diabetes    HPI Brian Key presents for a follow-up visit for  Grieving - handling things well, processing.  Joint pains -- has not been taking diclofenac -- has not tried cyclobenzaprine yet. Working -- at his job and around [employer], staying busy.      Current Medication: Outpatient Encounter Medications as of 10/02/2022  Medication Sig  . albuterol (VENTOLIN HFA) 108 (90 Base) MCG/ACT inhaler Inhale 2 puffs into the lungs every 6 (six) hours as needed for wheezing or shortness of breath.  . cyclobenzaprine (FLEXERIL) 10 MG tablet Take 1 tablet (10 mg total) by mouth at bedtime.  . Diclofenac-miSOPROStol 75-0.2 MG TBEC Take 1 tablet by mouth in the morning and at bedtime.  Marland Kitchen EPINEPHrine (EPIPEN 2-PAK) 0.3 mg/0.3 mL IJ SOAJ injection Inject 0.3 mg into the muscle as needed for anaphylaxis.  . feeding supplement (ENSURE ENLIVE / ENSURE PLUS) LIQD Take 237 mLs by mouth 3 (three) times daily between meals.  Marland Kitchen ipratropium-albuterol (DUONEB) 0.5-2.5 (3) MG/3ML SOLN Take 3 mLs by nebulization every 4 (four) hours as needed (wheezing/SOB/cough).  . Multiple Vitamin (MULTIVITAMIN WITH MINERALS) TABS tablet Take 1 tablet by mouth daily.  . Nutritional Supplements (,FEEDING SUPPLEMENT, PROSOURCE PLUS) liquid Take 30 mLs by mouth 3 (three) times daily between meals.  . tamsulosin (FLOMAX) 0.4 MG CAPS capsule TAKE 1 CAPSULE BY MOUTH ONCE DAILY 30 MINUTES AFTER LARGEST MEAL.  Marland Kitchen umeclidinium-vilanterol (ANORO ELLIPTA) 62.5-25 MCG/ACT AEPB Inhale 1 puff into the lungs daily.  . [DISCONTINUED] pantoprazole (PROTONIX) 40 MG tablet Take 1 tablet (40 mg total) by mouth daily.   No facility-administered encounter medications on file as of  10/02/2022.    Surgical History: Past Surgical History:  Procedure Laterality Date  . ANKLE SURGERY  2009  . COLONOSCOPY N/A 06/07/2022   Procedure: COLONOSCOPY;  Surgeon: Toledo, Benay Pike, MD;  Location: [facility] ENDOSCOPY;  Service: Gastroenterology;  Laterality: N/A;  . COLONOSCOPY N/A 06/08/2022   Procedure: COLONOSCOPY;  Surgeon: Toledo, Benay Pike, MD;  Location: [facility] ENDOSCOPY;  Service: Gastroenterology;  Laterality: N/A;  . ENTEROSCOPY N/A 06/10/2022   Procedure: ENTEROSCOPY;  Surgeon: Toledo, Benay Pike, MD;  Location: [facility] ENDOSCOPY;  Service: Gastroenterology;  Laterality: N/A;  . ESOPHAGOGASTRODUODENOSCOPY N/A 06/09/2022   Procedure: ESOPHAGOGASTRODUODENOSCOPY (EGD);  Surgeon: Toledo, Benay Pike, MD;  Location: [facility] ENDOSCOPY;  Service: Gastroenterology;  Laterality: N/A;  . [place]  . HERNIA REPAIR  [id number]   umbilical  . LIPOMA EXCISION  02/16/2017   back/ Dr Bary Castilla  . NECK SURGERY    . WRIST SURGERY Left 2007    Medical History: Past Medical History:  Diagnosis Date  . Back problem   . COPD (chronic obstructive pulmonary disease) ([place])   . Diabetes mellitus without complication ([place])   . Hyperlipidemia     Family History: Family History  Problem Relation Age of Onset  . Heart disease Mother   . COPD Father   . Asthma Father   . Prostate cancer Neg Hx   . Bladder Cancer Neg Hx   . Kidney cancer Neg Hx     Social History   Socioeconomic History  . Marital status: Married    Spouse name: Not on file  . Number  of children: Not on file  . Years of education: Not on file  . Highest education level: Not on file  Occupational History  . Not on file  Tobacco Use  . Smoking status: Every Day    Packs/day: 1.00    Years: 50.00    Total pack years: 50.00    Types: Cigarettes  . Smokeless tobacco: Never  Vaping Use  . Vaping Use: Never used  Substance and Sexual Activity  . Alcohol use: Yes    Comment: occasionally  . Drug use: Not Currently     Types: Marijuana  . Sexual activity: Yes    Birth control/protection: None  Other Topics Concern  . Not on file  Social History Narrative  . Not on file   Social Determinants of Health   Financial Resource Strain: Not on file  Food Insecurity: No Food Insecurity (06/05/2022)   Hunger Vital Sign   . Worried About Charity fundraiser in the Last Year: Never true   . Ran Out of Food in the Last Year: Never true  Transportation Needs: No Transportation Needs (06/05/2022)   PRAPARE - Transportation   . Lack of Transportation (Medical): No   . Lack of Transportation (Non-Medical): No  Physical Activity: Not on file  Stress: Not on file  Social Connections: Not on file  Intimate Partner Violence: Not At Risk (06/05/2022)   Humiliation, Afraid, Rape, and Kick questionnaire   . Fear of Current or Ex-Partner: No   . Emotionally Abused: No   . Physically Abused: No   . Sexually Abused: No      Review of Systems  Vital Signs: BP (!) 140/74   Pulse 68   Temp (!) 97.3 F (36.3 C)   Resp 16   Ht 5' 9"$  (1.753 m)   Wt 139 lb 12.8 oz (63.4 kg)   SpO2 93%   BMI 20.64 kg/m    Physical Exam     Assessment/Plan:   General Counseling: Harwood verbalizes understanding of the findings of todays visit and agrees with plan of treatment. I have discussed any further diagnostic evaluation that may be needed or ordered today. We also reviewed his medications today. he has been encouraged to call the office with any questions or concerns that should arise related to todays visit.    No orders of the defined types were placed in this encounter.   No orders of the defined types were placed in this encounter.   No follow-ups on file.   Total time spent:*** Minutes Time spent includes review of chart, medications, test results, and follow up plan with the patient.   [place] Controlled Substance Database was reviewed by me.  This patient was seen by Jonetta Osgood, FNP-C in  collaboration with Dr. Clayborn Bigness as a part of collaborative care agreement.   Jolita Haefner R. Valetta Fuller, MSN, FNP-C Internal medicine

## 2022-10-03 ENCOUNTER — Encounter: Payer: Self-pay | Admitting: Nurse Practitioner

## 2022-11-30 ENCOUNTER — Other Ambulatory Visit: Payer: Self-pay | Admitting: Urology

## 2022-11-30 DIAGNOSIS — N400 Enlarged prostate without lower urinary tract symptoms: Secondary | ICD-10-CM

## 2022-12-22 ENCOUNTER — Other Ambulatory Visit: Payer: Self-pay | Admitting: Nurse Practitioner

## 2022-12-22 DIAGNOSIS — J432 Centrilobular emphysema: Secondary | ICD-10-CM

## 2023-01-08 ENCOUNTER — Other Ambulatory Visit: Payer: Self-pay

## 2023-01-08 ENCOUNTER — Telehealth: Payer: Self-pay | Admitting: Nurse Practitioner

## 2023-01-08 DIAGNOSIS — J449 Chronic obstructive pulmonary disease, unspecified: Secondary | ICD-10-CM

## 2023-01-08 DIAGNOSIS — R0689 Other abnormalities of breathing: Secondary | ICD-10-CM

## 2023-01-08 MED ORDER — ALBUTEROL SULFATE HFA 108 (90 BASE) MCG/ACT IN AERS: 2.0000 | INHALATION_SPRAY | Freq: Four times a day (QID) | RESPIRATORY_TRACT | 5 refills | Status: DC | PRN

## 2023-01-08 NOTE — Telephone Encounter (Signed)
error 

## 2023-02-11 ENCOUNTER — Ambulatory Visit (INDEPENDENT_AMBULATORY_CARE_PROVIDER_SITE_OTHER): Payer: Medicare HMO | Admitting: Nurse Practitioner

## 2023-02-11 ENCOUNTER — Encounter: Payer: Self-pay | Admitting: Nurse Practitioner

## 2023-02-11 VITALS — BP 120/76 | HR 71 | Temp 98.7°F | Resp 16 | Ht 69.0 in | Wt 147.6 lb

## 2023-02-11 DIAGNOSIS — R7303 Prediabetes: Secondary | ICD-10-CM

## 2023-02-11 DIAGNOSIS — Z0001 Encounter for general adult medical examination with abnormal findings: Secondary | ICD-10-CM

## 2023-02-11 DIAGNOSIS — M5441 Lumbago with sciatica, right side: Secondary | ICD-10-CM

## 2023-02-11 DIAGNOSIS — M5442 Lumbago with sciatica, left side: Secondary | ICD-10-CM | POA: Diagnosis not present

## 2023-02-11 DIAGNOSIS — G8929 Other chronic pain: Secondary | ICD-10-CM

## 2023-02-11 DIAGNOSIS — E782 Mixed hyperlipidemia: Secondary | ICD-10-CM | POA: Diagnosis not present

## 2023-02-11 DIAGNOSIS — R300 Dysuria: Secondary | ICD-10-CM | POA: Diagnosis not present

## 2023-02-11 MED ORDER — DICLOFENAC-MISOPROSTOL 75-0.2 MG PO TBEC: 1.0000 | DELAYED_RELEASE_TABLET | Freq: Two times a day (BID) | ORAL | 2 refills | Status: AC

## 2023-02-11 NOTE — [place] Notes (Signed)
[facility] [address] [place], [place] [address]  Internal MEDICINE  Office Visit Note  Patient Name: Brian Key  [id number]  [id number]  Date of Service: 02/11/2023  Chief Complaint  Patient presents with   Hyperlipidemia   Diabetes   Medicare Wellness    HPI Joesph presents for an annual well visit and physical exam.  Well-appearing 77 y.o. male with COPD, BPH, osteoarthritis, prediabetes, and history of GI bleed. He is a current smoker with no interest in smoking cessation.  Routine CRC screening: done in 2023 Labs: due for routine labs  New or worsening pain: chronic leg pain Leg pain -- diclofenac not helping but he is only taking it every other day.  Using foot pad nerve stimulator and heat Tried lyrica but did not tolerate side effects        02/11/2023   10:13 AM 02/06/2022   10:01 AM 02/03/2021    8:48 AM  MMSE - Mini Mental State Exam  Orientation to time 5 5 5   Orientation to Place 5 5 5   Registration 3 3 3   Attention/ Calculation 5 5 5   Recall 3 3 3   Language- name 2 objects 2 2 2   Language- repeat 1 1 1   Language- follow 3 step command 3 3 3   Language- read & follow direction 1 1 1   Write a sentence 1 1 1   Copy design 1 1 1   Total score 30 30 30     Functional Status Survey: Is the patient deaf or have difficulty hearing?: No Does the patient have difficulty seeing, even when wearing glasses/contacts?: No Does the patient have difficulty concentrating, remembering, or making decisions?: No Does the patient have difficulty walking or climbing stairs?: No Does the patient have difficulty dressing or bathing?: No Does the patient have difficulty doing errands alone such as visiting a doctor's office or shopping?: No     06/10/2022    8:00 PM 06/11/2022    8:00 AM 06/15/2022    2:07 PM 10/02/2022    8:33 AM 02/11/2023   10:11 AM  Fall Risk  Falls in the past year?   0 0 0  Was there an injury with Fall?   0 0 0  Fall Risk  Category Calculator   0 0 0  Fall Risk Category (Retired)   Low    (RETIRED) Patient Fall Risk Level High fall risk High fall risk Low fall risk    Patient at Risk for Falls Due to   No Fall Risks No Fall Risks No Fall Risks  Fall risk Follow up   Falls evaluation completed Falls evaluation completed Falls evaluation completed       02/11/2023   10:12 AM  Depression screen PHQ 2/9  Decreased Interest 0  Down, Depressed, Hopeless 0  PHQ - 2 Score 0       Current Medication: Outpatient Encounter Medications as of 02/11/2023  Medication Sig   albuterol (VENTOLIN HFA) 108 (90 Base) MCG/ACT inhaler Inhale 2 puffs into the lungs every 6 (six) hours as needed for wheezing or shortness of breath.   ANORO ELLIPTA 62.5-25 MCG/ACT AEPB INHALE 1 PUFF INTO THE LUNGS DAILY   cyclobenzaprine (FLEXERIL) 10 MG tablet Take 1 tablet (10 mg total) by mouth at bedtime.   EPINEPHrine (EPIPEN 2-PAK) 0.3 mg/0.3 mL IJ SOAJ injection Inject 0.3 mg into the muscle as needed for anaphylaxis.   feeding supplement (ENSURE ENLIVE / ENSURE PLUS) LIQD Take 237 mLs by mouth 3 (  three) times daily between meals.   ipratropium-albuterol (DUONEB) 0.5-2.5 (3) MG/3ML SOLN Take 3 mLs by nebulization every 4 (four) hours as needed (wheezing/SOB/cough).   Multiple Vitamin (MULTIVITAMIN WITH MINERALS) TABS tablet Take 1 tablet by mouth daily.   Nutritional Supplements (,FEEDING SUPPLEMENT, PROSOURCE PLUS) liquid Take 30 mLs by mouth 3 (three) times daily between meals.   tamsulosin (FLOMAX) 0.4 MG CAPS capsule TAKE 1 CAPSULE BY MOUTH ONCE DAILY 30 MINUTES AFTER LARGEST MEAL.   [DISCONTINUED] Diclofenac-miSOPROStol 75-0.2 MG TBEC Take 1 tablet by mouth in the morning and at bedtime.   Diclofenac-miSOPROStol 75-0.2 MG TBEC Take 1 tablet by mouth in the morning and at bedtime.   [DISCONTINUED] albuterol (VENTOLIN HFA) 108 (90 Base) MCG/ACT inhaler Inhale 2 puffs into the lungs every 6 (six) hours as needed for wheezing or shortness  of breath.   [DISCONTINUED] pantoprazole (PROTONIX) 40 MG tablet Take 1 tablet (40 mg total) by mouth daily.   [DISCONTINUED] tamsulosin (FLOMAX) 0.4 MG CAPS capsule TAKE 1 CAPSULE BY MOUTH ONCE DAILY 30 MINUTES AFTER LARGEST MEAL.   [DISCONTINUED] umeclidinium-vilanterol (ANORO ELLIPTA) 62.5-25 MCG/ACT AEPB Inhale 1 puff into the lungs daily.   No facility-administered encounter medications on file as of 02/11/2023.    Surgical History: Past Surgical History:  Procedure Laterality Date   ANKLE SURGERY  2009   COLONOSCOPY N/A 06/07/2022   Procedure: COLONOSCOPY;  Surgeon: Toledo, Boykin Nearing, MD;  Location: [facility] ENDOSCOPY;  Service: Gastroenterology;  Laterality: N/A;   COLONOSCOPY N/A 06/08/2022   Procedure: COLONOSCOPY;  Surgeon: Toledo, Boykin Nearing, MD;  Location: [facility]OSCOPY;  Service: Gastroenterology;  Laterality: N/A;   ENTEROSCOPY N/A 06/10/2022   Procedure: ENTEROSCOPY;  Surgeon: Toledo, Boykin Nearing, MD;  Location: [facility]OS[facility];  Service: Gastroenterology;  Laterality: N/A;   ESOPHAGOGASTRODUODENOSCOPY N/A 06/09/2022   Procedure: ESOPHAGOGASTRODUODENOSCOPY (EGD);  Surgeon: Toledo, Boykin Nearing, MD;  Location: [facility] ENDOSCOPY;  Service: Gastroenterology;  Laterality: N/A;   HEMORRHOID SURGERY  1978   HERNIA REPAIR  2008   umbilical   LIPOMA EXCISION  02/16/2017   back/ Dr Lemar Livings   NECK SURGERY     WRIST SURGERY Left 2007    Medical History: Past Medical History:  Diagnosis Date   Back problem    COPD (chronic obstructive pulmonary disease) (HCC)    Diabetes mellitus without complication (HCC)    Hyperlipidemia     Family History: Family History  Problem Relation Age of Onset   Heart disease Mother    COPD Father    Asthma Father    Prostate cancer Neg Hx    Bladder Cancer Neg Hx    Kidney cancer Neg Hx     Social History   Socioeconomic History   Marital status: Married    Spouse name: Not on file   Number of children: Not on file   Years of education: Not on  file   Highest education level: Not on file  Occupational History   Not on file  Tobacco Use   Smoking status: Every Day    Packs/day: 1.00    Years: 50.00    Additional pack years: 0.00    Total pack years: 50.00    Types: Cigarettes   Smokeless tobacco: Never  Vaping Use   Vaping Use: Never used  Substance and Sexual Activity   Alcohol use: Yes    Comment: occasionally   Drug use: Not Currently    Types: Marijuana   Sexual activity: Yes    Birth control/protection: None  Other  Topics Concern   Not on file  Social History Narrative   Not on file   Social Determinants of Health   Financial Resource Strain: Not on file  Food Insecurity: No Food Insecurity (06/05/2022)   Hunger Vital Sign    Worried About Running Out of Food in the Last Year: Never true    Ran Out of Food in the Last Year: Never true  Transportation Needs: No Transportation Needs (06/05/2022)   PRAPARE - Administrator, Civil Service (Medical): No    Lack of Transportation (Non-Medical): No  Physical Activity: Not on file  Stress: Not on file  Social Connections: Not on file  Intimate Partner Violence: Not At Risk (06/05/2022)   Humiliation, Afraid, Rape, and Kick questionnaire    Fear of Current or Ex-Partner: No    Emotionally Abused: No    Physically Abused: No    Sexually Abused: No      Review of Systems  Constitutional:  Negative for activity change, appetite change, chills, fatigue, fever and unexpected weight change.  HENT: Negative.  Negative for congestion, ear pain, rhinorrhea, sore throat and trouble swallowing.   Eyes: Negative.   Respiratory:  Positive for cough (intermittent) and shortness of breath (intermittent). Negative for chest tightness and wheezing.   Cardiovascular: Negative.  Negative for chest pain and palpitations.  Gastrointestinal: Negative.  Negative for abdominal pain, blood in stool, constipation, diarrhea, nausea and vomiting.  Endocrine: Negative.    Genitourinary: Negative.  Negative for difficulty urinating, dysuria, frequency, hematuria and urgency.  Musculoskeletal: Negative.  Negative for arthralgias, back pain, joint swelling, myalgias and neck pain.  Skin: Negative.  Negative for rash and wound.  Allergic/Immunologic: Negative.  Negative for immunocompromised state.  Neurological: Negative.  Negative for dizziness, seizures, numbness and headaches.  Hematological: Negative.   Psychiatric/Behavioral: Negative.  Negative for behavioral problems, self-injury and suicidal ideas. The patient is not nervous/anxious.     Vital Signs: BP 120/76   Pulse 71   Temp 98.7 F (37.1 C)   Resp 16   Ht 5\' 9"  (1.753 m)   Wt 147 lb 9.6 oz (67 kg)   SpO2 97%   BMI 21.80 kg/m    Physical Exam Vitals reviewed.  Constitutional:      General: He is not in acute distress.    Appearance: Normal appearance. He is normal weight. He is not ill-appearing.  HENT:     Head: Normocephalic and atraumatic.     Right Ear: Tympanic membrane, ear canal and external ear normal.     Left Ear: Tympanic membrane, ear canal and external ear normal.     Nose: Nose normal. No congestion or rhinorrhea.     Mouth/Throat:     Mouth: Mucous membranes are moist.     Pharynx: Oropharynx is clear. No oropharyngeal exudate or posterior oropharyngeal erythema.  Eyes:     Extraocular Movements: Extraocular movements intact.     Conjunctiva/sclera: Conjunctivae normal.     Pupils: Pupils are equal, round, and reactive to light.  Neck:     Vascular: No carotid bruit.  Cardiovascular:     Rate and Rhythm: Normal rate and regular rhythm.     Pulses: Normal pulses.     Heart sounds: Normal heart sounds. No murmur heard. Pulmonary:     Effort: Pulmonary effort is normal. No respiratory distress.     Breath sounds: Normal air entry. Examination of the right-upper field reveals wheezing. Examination of the left-upper field reveals wheezing.  Wheezing present.      Comments: Wheezing is mild Abdominal:     General: Bowel sounds are normal. There is no distension.     Palpations: Abdomen is soft.     Tenderness: There is no abdominal tenderness. There is no guarding.     Hernia: No hernia is present.  Musculoskeletal:        General: Normal range of motion.     Cervical back: Normal range of motion and neck supple.     Right lower leg: No edema.     Left lower leg: No edema.  Lymphadenopathy:     Cervical: No cervical adenopathy.  Skin:    General: Skin is warm and dry.     Capillary Refill: Capillary refill takes less than 2 seconds.     Coloration: Skin is not jaundiced or pale.     Findings: No rash.  Neurological:     Mental Status: He is alert and oriented to person, place, and time.     Gait: Gait normal.  Psychiatric:        Mood and Affect: Mood normal.        Behavior: Behavior normal.        Assessment/Plan: 1. Encounter for routine adult health examination with abnormal findings Age-appropriate preventive screenings and vaccinations discussed, annual physical exam completed. Routine labs for health maintenance ordered, see below. PHM updated.  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  Dispense: 60 tablet; Refill: 2  3. Prediabetes Routine labs ordered  - CBC with Differential/Platelet - CMP14+EGFR - Lipid Profile  4. Mixed hyperlipidemia Routine labs ordered  - CBC with Differential/Platelet - CMP14+EGFR - Lipid Profile  5. Dysuria Routine urinalysis done  - UA/M w/rflx Culture, Routine      General Counseling: Tyheim verbalizes understanding of the findings of todays visit and agrees with plan of treatment. I have discussed any further diagnostic evaluation that may be needed or ordered  today. We also reviewed his medications today. he has been encouraged to call the office with any questions or concerns that should arise related to todays visit.    Orders Placed This Encounter  Procedures   CBC with Differential/Platelet   CMP14+EGFR   Lipid Profile   UA/M w/rflx Culture, Routine    Meds ordered this encounter  Medications   Diclofenac-miSOPROStol 75-0.2 MG TBEC    Sig: Take 1 tablet by mouth in the morning and at bedtime.    Dispense:  60 tablet    Refill:  2    Return in about 6 months (around 08/13/2023) for F/U, Livi Mcgann PCP.   Total time spent:30 Minutes Time spent includes review of chart, medications, test results, and follow up plan with the patient.   [place] Controlled Substance Database was reviewed by me.  This patient was seen by Sallyanne Kuster, FNP-C in collaboration with Dr. Beverely Risen as a part of collaborative care agreement.  Donnisha Besecker R. Tedd Sias, MSN, FNP-C Internal medicine

## 2023-02-12 LAB — MICROSCOPIC EXAMINATION
Bacteria, UA: NONE SEEN
Casts: NONE SEEN /lpf
Epithelial Cells (non renal): NONE SEEN /hpf (ref 0–10)
RBC, Urine: NONE SEEN /hpf (ref 0–2)
WBC, UA: NONE SEEN /hpf (ref 0–5)

## 2023-02-12 LAB — CBC WITH DIFFERENTIAL/PLATELET
Basophils Absolute: 0.1 10*3/uL (ref 0.0–0.2)
Basos: 1 %
EOS (ABSOLUTE): 0.2 10*3/uL (ref 0.0–0.4)
Eos: 2 %
Hematocrit: 49.5 % (ref 37.5–51.0)
Hemoglobin: 16.4 g/dL (ref 13.0–17.7)
Immature Grans (Abs): 0 10*3/uL (ref 0.0–0.1)
Immature Granulocytes: 0 %
Lymphocytes Absolute: 2.4 10*3/uL (ref 0.7–3.1)
Lymphs: 36 %
MCH: 27.8 pg (ref 26.6–33.0)
MCHC: 33.1 g/dL (ref 31.5–35.7)
MCV: 84 fL (ref 79–97)
Monocytes Absolute: 0.6 10*3/uL (ref 0.1–0.9)
Monocytes: 9 %
Neutrophils Absolute: 3.5 10*3/uL (ref 1.4–7.0)
Neutrophils: 52 %
Platelets: 159 10*3/uL (ref 150–450)
RBC: 5.9 x10E6/uL — ABNORMAL HIGH (ref 4.14–5.80)
RDW: 18.2 % — ABNORMAL HIGH (ref 11.6–15.4)
WBC: 6.8 10*3/uL (ref 3.4–10.8)

## 2023-02-12 LAB — CMP14+EGFR
ALT: 10 IU/L (ref 0–44)
AST: 18 IU/L (ref 0–40)
Albumin: 4.2 g/dL (ref 3.8–4.8)
Alkaline Phosphatase: 110 IU/L (ref 44–121)
BUN/Creatinine Ratio: 15 (ref 10–24)
BUN: 15 mg/dL (ref 8–27)
Bilirubin Total: 0.4 mg/dL (ref 0.0–1.2)
CO2: 22 mmol/L (ref 20–29)
Calcium: 9.1 mg/dL (ref 8.6–10.2)
Chloride: 106 mmol/L (ref 96–106)
Creatinine, Ser: 0.98 mg/dL (ref 0.76–1.27)
Globulin, Total: 2.2 g/dL (ref 1.5–4.5)
Glucose: 96 mg/dL (ref 70–99)
Potassium: 4.8 mmol/L (ref 3.5–5.2)
Sodium: 142 mmol/L (ref 134–144)
Total Protein: 6.4 g/dL (ref 6.0–8.5)
eGFR: 80 mL/min/{1.73_m2} (ref >59)

## 2023-02-12 LAB — UA/M W/RFLX CULTURE, ROUTINE
Bilirubin, UA: NEGATIVE
Glucose, UA: NEGATIVE
Ketones, UA: NEGATIVE
Leukocytes,UA: NEGATIVE
Nitrite, UA: NEGATIVE
Protein,UA: NEGATIVE
RBC, UA: NEGATIVE
Specific Gravity, UA: 1.023 (ref 1.005–1.030)
Urobilinogen, Ur: 0.2 mg/dL (ref 0.2–1.0)
pH, UA: 5 (ref 5.0–7.5)

## 2023-02-12 LAB — LIPID PANEL
Chol/HDL Ratio: 4.3 ratio (ref 0.0–5.0)
Cholesterol, Total: 200 mg/dL — ABNORMAL HIGH (ref 100–199)
HDL: 47 mg/dL (ref >39)
LDL Chol Calc (NIH): 132 mg/dL — ABNORMAL HIGH (ref 0–99)
Triglycerides: 114 mg/dL (ref 0–149)
VLDL Cholesterol Cal: 21 mg/dL (ref 5–40)

## 2023-02-16 ENCOUNTER — Telehealth: Payer: Self-pay

## 2023-02-16 NOTE — [place] Notes (Signed)
Cholesterol levels are improving CBC and CMP are normal

## 2023-02-16 NOTE — Telephone Encounter (Signed)
Left message for patient to give the office a call

## 2023-02-17 ENCOUNTER — Telehealth: Payer: Self-pay

## 2023-02-17 NOTE — Telephone Encounter (Signed)
Patient called back and lab results were given to him.

## 2023-02-17 NOTE — Telephone Encounter (Signed)
-----   Message from Sallyanne Kuster, NP sent at 02/16/2023  8:24 AM EDT ----- Cholesterol levels are improving CBC and CMP are normal

## 2023-02-17 NOTE — Telephone Encounter (Signed)
Send mychart message

## 2023-02-17 NOTE — Telephone Encounter (Signed)
Error

## 2023-03-01 ENCOUNTER — Other Ambulatory Visit: Payer: Self-pay

## 2023-03-01 DIAGNOSIS — N400 Enlarged prostate without lower urinary tract symptoms: Secondary | ICD-10-CM

## 2023-03-01 MED ORDER — TAMSULOSIN HCL 0.4 MG PO CAPS: ORAL_CAPSULE | ORAL | 3 refills | Status: DC

## 2023-03-25 ENCOUNTER — Ambulatory Visit: Payer: Medicare HMO | Admitting: Urology

## 2023-03-25 ENCOUNTER — Encounter: Payer: Self-pay | Admitting: Urology

## 2023-06-28 ENCOUNTER — Other Ambulatory Visit: Payer: Self-pay

## 2023-06-28 DIAGNOSIS — T782XXA Anaphylactic shock, unspecified, initial encounter: Secondary | ICD-10-CM

## 2023-06-28 DIAGNOSIS — R0689 Other abnormalities of breathing: Secondary | ICD-10-CM

## 2023-06-28 MED ORDER — EPINEPHRINE 0.3 MG/0.3ML IJ SOAJ: 0.3000 mg | INTRAMUSCULAR | 2 refills | Status: DC | PRN

## 2023-08-12 ENCOUNTER — Other Ambulatory Visit: Payer: Self-pay

## 2023-08-12 ENCOUNTER — Encounter: Payer: Self-pay | Admitting: Nurse Practitioner

## 2023-08-12 ENCOUNTER — Ambulatory Visit: Payer: Medicare HMO | Admitting: Nurse Practitioner

## 2023-08-12 VITALS — BP 132/76 | HR 60 | Temp 98.2°F | Resp 16 | Ht 69.0 in | Wt 162.2 lb

## 2023-08-12 DIAGNOSIS — R0689 Other abnormalities of breathing: Secondary | ICD-10-CM | POA: Diagnosis not present

## 2023-08-12 DIAGNOSIS — M5441 Lumbago with sciatica, right side: Secondary | ICD-10-CM | POA: Diagnosis not present

## 2023-08-12 DIAGNOSIS — G8929 Other chronic pain: Secondary | ICD-10-CM | POA: Diagnosis not present

## 2023-08-12 DIAGNOSIS — M5442 Lumbago with sciatica, left side: Secondary | ICD-10-CM

## 2023-08-12 DIAGNOSIS — E782 Mixed hyperlipidemia: Secondary | ICD-10-CM | POA: Diagnosis not present

## 2023-08-12 DIAGNOSIS — J432 Centrilobular emphysema: Secondary | ICD-10-CM

## 2023-08-12 DIAGNOSIS — R7303 Prediabetes: Secondary | ICD-10-CM

## 2023-08-12 LAB — POCT GLYCOSYLATED HEMOGLOBIN (HGB A1C): Hemoglobin A1C: 5.8 % — AB (ref 4.0–5.6)

## 2023-08-12 MED ORDER — ANORO ELLIPTA 62.5-25 MCG/ACT IN AEPB: 1.0000 | INHALATION_SPRAY | Freq: Every day | RESPIRATORY_TRACT | 1 refills | Status: DC

## 2023-08-12 MED ORDER — ALBUTEROL SULFATE HFA 108 (90 BASE) MCG/ACT IN AERS: 2.0000 | INHALATION_SPRAY | Freq: Four times a day (QID) | RESPIRATORY_TRACT | 5 refills | Status: AC | PRN

## 2023-08-12 MED ORDER — IPRATROPIUM-ALBUTEROL 0.5-2.5 (3) MG/3ML IN SOLN: 3.0000 mL | RESPIRATORY_TRACT | 5 refills | Status: AC | PRN

## 2023-08-12 MED ORDER — ALBUTEROL SULFATE HFA 108 (90 BASE) MCG/ACT IN AERS: 2.0000 | INHALATION_SPRAY | Freq: Four times a day (QID) | RESPIRATORY_TRACT | 5 refills | Status: DC | PRN

## 2023-08-12 NOTE — [place] Notes (Signed)
[facility] [address] [place], [place] [address]  Internal MEDICINE  Office Visit Note  Patient Name: Brian Key  [id number]  [id number]  Date of Service: 08/12/2023  Chief Complaint  Patient presents with   Hyperlipidemia   Follow-up     Inoke presents for a follow-up visit for COPD, arthritis, prediabetes and elevated BP. COPD -- takes anoro ellipta, and has albuterol inhaler and neb treatments as needed. No hospitalizations or ER visits since his last office visit. Elevated blood pressure -- improved when rechecked. Patient has tried BP medicatins in the past but does not like the side effects.  Taking some supplements OTC to help with joint pain -- thinks they might be helping but not sure yet.  Arthritis -- stable, manageable.  Prediabetes -- last A1c was 5.8 1 year ago, is due to be rechecked. Remains stable with no change today    Current Medication: Outpatient Encounter Medications as of 08/12/2023  Medication Sig   cyclobenzaprine (FLEXERIL) 10 MG tablet Take 1 tablet (10 mg total) by mouth at bedtime.   Diclofenac-miSOPROStol 75-0.2 MG TBEC Take 1 tablet by mouth in the morning and at bedtime.   EPINEPHrine (EPIPEN 2-PAK) 0.3 mg/0.3 mL IJ SOAJ injection Inject 0.3 mg into the muscle as needed for anaphylaxis.   feeding supplement (ENSURE ENLIVE / ENSURE PLUS) LIQD Take 237 mLs by mouth 3 (three) times daily between meals.   Multiple Vitamin (MULTIVITAMIN WITH MINERALS) TABS tablet Take 1 tablet by mouth daily.   Nutritional Supplements (,FEEDING SUPPLEMENT, PROSOURCE PLUS) liquid Take 30 mLs by mouth 3 (three) times daily between meals.   tamsulosin (FLOMAX) 0.4 MG CAPS capsule TAKE 1 CAPSULE BY MOUTH ONCE DAILY 30 MINUTES AFTER LARGEST MEAL.   [DISCONTINUED] albuterol (VENTOLIN HFA) 108 (90 Base) MCG/ACT inhaler Inhale 2 puffs into the lungs every 6 (six) hours as needed for wheezing or shortness of breath.   [DISCONTINUED] ANORO ELLIPTA 62.5-25  MCG/ACT AEPB INHALE 1 PUFF INTO THE LUNGS DAILY   [DISCONTINUED] ipratropium-albuterol (DUONEB) 0.5-2.5 (3) MG/3ML SOLN Take 3 mLs by nebulization every 4 (four) hours as needed (wheezing/SOB/cough).   albuterol (VENTOLIN HFA) 108 (90 Base) MCG/ACT inhaler Inhale 2 puffs into the lungs every 6 (six) hours as needed for wheezing or shortness of breath.   ipratropium-albuterol (DUONEB) 0.5-2.5 (3) MG/3ML SOLN Take 3 mLs by nebulization every 4 (four) hours as needed (wheezing/SOB/cough).   umeclidinium-vilanterol (ANORO ELLIPTA) 62.5-25 MCG/ACT AEPB Inhale 1 puff into the lungs daily.   No facility-administered encounter medications on file as of 08/12/2023.    Surgical History: Past Surgical History:  Procedure Laterality Date   ANKLE SURGERY  2009   COLONOSCOPY N/A 06/07/2022   Procedure: COLONOSCOPY;  Surgeon: Toledo, Boykin Nearing, MD;  Location: [facility] ENDOSCOPY;  Service: Gastroenterology;  Laterality: N/A;   COLONOSCOPY N/A 06/08/2022   Procedure: COLONOSCOPY;  Surgeon: Toledo, Boykin Nearing, MD;  Location: [facility];  Service: Gastroenterology;  Laterality: N/A;   ENTEROSCOPY N/A 06/10/2022   Procedure: ENTEROSCOPY;  Surgeon: Toledo, Boykin Nearing, MD;  Location: [facility];  Service: Gastroenterology;  Laterality: N/A;   ESOPHAGOGASTRODUODENOSCOPY N/A 06/09/2022   Procedure: ESOPHAGOGASTRODUODENOSCOPY (EGD);  Surgeon: Toledo, Boykin Nearing, MD;  Location: [facility] END[facility];  Service: Gastroenterology;  Laterality: N/A;   HEMORRHOID SURGERY  1978   HERNIA REPAIR  2008   umbilical   LIPOMA EXCISION  02/16/2017   back/ Dr Lemar Livings   NECK SURGERY     WRIST SURGERY Left 2007    Medical  History: Past Medical History:  Diagnosis Date   Back problem    COPD (chronic obstructive pulmonary disease) (HCC)    Diabetes mellitus without complication (HCC)    Hyperlipidemia     Family History: Family History  Problem Relation Age of Onset   Heart disease Mother    COPD Father    Asthma Father     Prostate cancer Neg Hx    Bladder Cancer Neg Hx    Kidney cancer Neg Hx     Social History   Socioeconomic History   Marital status: Widowed    Spouse name: Not on file   Number of children: Not on file   Years of education: Not on file   Highest education level: Not on file  Occupational History   Not on file  Tobacco Use   Smoking status: Every Day    Current packs/day: 1.00    Average packs/day: 1 pack/day for 50.0 years (50.0 ttl pk-yrs)    Types: Cigarettes   Smokeless tobacco: Never  Vaping Use   Vaping status: Never Used  Substance and Sexual Activity   Alcohol use: Yes    Comment: occasionally   Drug use: Not Currently    Types: Marijuana   Sexual activity: Yes    Birth control/protection: None  Other Topics Concern   Not on file  Social History Narrative   Not on file   Social Drivers of Health   Financial Resource Strain: Not on file  Food Insecurity: No Food Insecurity (06/05/2022)   Hunger Vital Sign    Worried About Running Out of Food in the Last Year: Never true    Ran Out of Food in the Last Year: Never true  Transportation Needs: No Transportation Needs (06/05/2022)   PRAPARE - Administrator, Civil Service (Medical): No    Lack of Transportation (Non-Medical): No  Physical Activity: Not on file  Stress: Not on file  Social Connections: Not on file  Intimate Partner Violence: Not At Risk (06/05/2022)   Humiliation, Afraid, Rape, and Kick questionnaire    Fear of Current or Ex-Partner: No    Emotionally Abused: No    Physically Abused: No    Sexually Abused: No      Review of Systems  Constitutional:  Positive for fatigue. Negative for chills and fever.  Respiratory:  Positive for cough, chest tightness, shortness of breath and wheezing.        Respiratory symptoms are intermittent  Cardiovascular: Negative.  Negative for chest pain and palpitations.  Gastrointestinal:  Negative for constipation, diarrhea and nausea.   Musculoskeletal:  Positive for arthralgias, back pain and myalgias.  Neurological:  Positive for headaches.    Vital Signs: BP 132/76 Comment: 150/80  Pulse 60   Temp 98.2 F (36.8 C)   Resp 16   Ht 5\' 9"  (1.753 m)   Wt 162 lb 3.2 oz (73.6 kg)   SpO2 97%   BMI 23.95 kg/m    Physical Exam Vitals reviewed.  Constitutional:      General: He is not in acute distress.    Appearance: Normal appearance. He is normal weight. He is not ill-appearing.  HENT:     Head: Normocephalic and atraumatic.  Eyes:     Pupils: Pupils are equal, round, and reactive to light.  Cardiovascular:     Rate and Rhythm: Normal rate and regular rhythm.     Heart sounds: Normal heart sounds. No murmur heard. Pulmonary:  Effort: Pulmonary effort is normal. No respiratory distress.     Breath sounds: Normal breath sounds. No wheezing.  Neurological:     Mental Status: He is alert and oriented to person, place, and time.     Cranial Nerves: No cranial nerve deficit.     Coordination: Coordination normal.     Gait: Gait normal.  Psychiatric:        Mood and Affect: Mood normal.        Behavior: Behavior normal.        Assessment/Plan: 1. Centrilobular emphysema (HCC) (Primary) Continue anoro as prescribed.  - umeclidinium-vilanterol (ANORO ELLIPTA) 62.5-25 MCG/ACT AEPB; Inhale 1 puff into the lungs daily.  Dispense: 180 each; Refill: 1  2. Difficulty breathing Continue prn albuterol and duoneb as prescribed.  - albuterol (VENTOLIN HFA) 108 (90 Base) MCG/ACT inhaler; Inhale 2 puffs into the lungs every 6 (six) hours as needed for wheezing or shortness of breath.  Dispense: 3 each; Refill: 5 - ipratropium-albuterol (DUONEB) 0.5-2.5 (3) MG/3ML SOLN; Take 3 mLs by nebulization every 4 (four) hours as needed (wheezing/SOB/cough).  Dispense: 360 mL; Refill: 5  3. Prediabetes A1c is stable, no change. Routine labs ordered.  - POCT glycosylated hemoglobin (Hb A1C) - CBC with  Differential/Platelet - CMP14+EGFR - Lipid Profile  4. Mixed hyperlipidemia Routine labs ordered  - CBC with Differential/Platelet - CMP14+EGFR - Lipid Profile  5. Chronic midline low back pain with bilateral sciatica Taking some over the counter supplements, reports slight improvement.     General Counseling: Alphonsus Sias understanding of the findings of todays visit and agrees with plan of treatment. I have discussed any further diagnostic evaluation that may be needed or ordered today. We also reviewed his medications today. he has been encouraged to call the office with any questions or concerns that should arise related to todays visit.    Orders Placed This Encounter  Procedures   POCT glycosylated hemoglobin (Hb A1C)    Meds ordered this encounter  Medications   umeclidinium-vilanterol (ANORO ELLIPTA) 62.5-25 MCG/ACT AEPB    Sig: Inhale 1 puff into the lungs daily.    Dispense:  180 each    Refill:  1   albuterol (VENTOLIN HFA) 108 (90 Base) MCG/ACT inhaler    Sig: Inhale 2 puffs into the lungs every 6 (six) hours as needed for wheezing or shortness of breath.    Dispense:  3 each    Refill:  5   ipratropium-albuterol (DUONEB) 0.5-2.5 (3) MG/3ML SOLN    Sig: Take 3 mLs by nebulization every 4 (four) hours as needed (wheezing/SOB/cough).    Dispense:  360 mL    Refill:  5    For future refills, keep on file.    Return for previously scheduled, CPE, Harel Repetto PCP and have labs done prior to office visit in june .   Total time spent:30 Minutes Time spent includes review of chart, medications, test results, and follow up plan with the patient.   [place] Controlled Substance Database was reviewed by me.  This patient was seen by Sallyanne Kuster, FNP-C in collaboration with Dr. Beverely Risen as a part of collaborative care agreement.   Khalifa Knecht R. Tedd Sias, MSN, FNP-C Internal medicine

## 2023-08-13 ENCOUNTER — Ambulatory Visit: Payer: Medicare HMO | Admitting: Nurse Practitioner

## 2023-11-25 ENCOUNTER — Encounter: Payer: Self-pay | Admitting: Physician Assistant

## 2023-11-25 ENCOUNTER — Ambulatory Visit (INDEPENDENT_AMBULATORY_CARE_PROVIDER_SITE_OTHER): Admitting: Physician Assistant

## 2023-11-25 VITALS — BP 160/80 | HR 67 | Temp 98.3°F | Resp 16 | Ht 69.0 in | Wt 159.4 lb

## 2023-11-25 DIAGNOSIS — T782XXA Anaphylactic shock, unspecified, initial encounter: Secondary | ICD-10-CM

## 2023-11-25 DIAGNOSIS — T63441A Toxic effect of venom of bees, accidental (unintentional), initial encounter: Secondary | ICD-10-CM | POA: Diagnosis not present

## 2023-11-25 DIAGNOSIS — N400 Enlarged prostate without lower urinary tract symptoms: Secondary | ICD-10-CM | POA: Diagnosis not present

## 2023-11-25 DIAGNOSIS — J432 Centrilobular emphysema: Secondary | ICD-10-CM | POA: Diagnosis not present

## 2023-11-25 DIAGNOSIS — I10 Essential (primary) hypertension: Secondary | ICD-10-CM | POA: Diagnosis not present

## 2023-11-25 MED ORDER — ANORO ELLIPTA 62.5-25 MCG/ACT IN AEPB: 1.0000 | INHALATION_SPRAY | Freq: Every day | RESPIRATORY_TRACT | 1 refills | Status: AC

## 2023-11-25 MED ORDER — TAMSULOSIN HCL 0.4 MG PO CAPS: ORAL_CAPSULE | ORAL | 3 refills | Status: AC

## 2023-11-25 MED ORDER — EPINEPHRINE 0.3 MG/0.3ML IJ SOAJ: 0.3000 mg | INTRAMUSCULAR | 2 refills | Status: AC | PRN

## 2023-11-25 NOTE — Progress Notes (Signed)
 [facility] [address] [place], [place] [address]  Internal MEDICINE  Office Visit Note  Patient Name: Brian Key  [id number]  [id number]  Date of Service: 11/25/2023  Chief Complaint  Patient presents with   Diabetes   Hyperlipidemia   Follow-up    HPI Pt is here for routine follow up for med refills -leaving for [place] next Wednesday for a new job and will be gone several years -he needs refills on his meds until he can't establish with PCP locally -he needs his anoro, tamsulosin, and 2 epi pens--he needs 2 as he is allergic to bees and works on [employer] sites where he is at risk of encountering and needs to have on hand.  Current Medication: Outpatient Encounter Medications as of 11/25/2023  Medication Sig   albuterol (VENTOLIN HFA) 108 (90 Base) MCG/ACT inhaler Inhale 2 puffs into the lungs every 6 (six) hours as needed for wheezing or shortness of breath.   cyclobenzaprine (FLEXERIL) 10 MG tablet Take 1 tablet (10 mg total) by mouth at bedtime.   Diclofenac-miSOPROStol 75-0.2 MG TBEC Take 1 tablet by mouth in the morning and at bedtime.   feeding supplement (ENSURE ENLIVE / ENSURE PLUS) LIQD Take 237 mLs by mouth 3 (three) times daily between meals.   ipratropium-albuterol (DUONEB) 0.5-2.5 (3) MG/3ML SOLN Take 3 mLs by nebulization every 4 (four) hours as needed (wheezing/SOB/cough).   Multiple Vitamin (MULTIVITAMIN WITH MINERALS) TABS tablet Take 1 tablet by mouth daily.   Nutritional Supplements (,FEEDING SUPPLEMENT, PROSOURCE PLUS) liquid Take 30 mLs by mouth 3 (three) times daily between meals.   [DISCONTINUED] EPINEPHrine (EPIPEN 2-PAK) 0.3 mg/0.3 mL IJ SOAJ injection Inject 0.3 mg into the muscle as needed for anaphylaxis.   [DISCONTINUED] tamsulosin (FLOMAX) 0.4 MG CAPS capsule TAKE 1 CAPSULE BY MOUTH ONCE DAILY 30 MINUTES AFTER LARGEST MEAL.   [DISCONTINUED] umeclidinium-vilanterol (ANORO ELLIPTA) 62.5-25 MCG/ACT AEPB Inhale 1 puff into the lungs  daily.   EPINEPHrine (EPIPEN 2-PAK) 0.3 mg/0.3 mL IJ SOAJ injection Inject 0.3 mg into the muscle as needed for anaphylaxis.   tamsulosin (FLOMAX) 0.4 MG CAPS capsule TAKE 1 CAPSULE BY MOUTH ONCE DAILY 30 MINUTES AFTER LARGEST MEAL.   umeclidinium-vilanterol (ANORO ELLIPTA) 62.5-25 MCG/ACT AEPB Inhale 1 puff into the lungs daily.   No facility-administered encounter medications on file as of 11/25/2023.    Surgical History: Past Surgical History:  Procedure Laterality Date   ANKLE SURGERY  2009   COLONOSCOPY N/A 06/07/2022   Procedure: COLONOSCOPY;  Surgeon: Toledo, Boykin Nearing, MD;  Location: [facility] ENDOSCOPY;  Service: Gastroenterology;  Laterality: N/A;   COLONOSCOPY N/A 06/08/2022   Procedure: COLONOSCOPY;  Surgeon: Toledo, Boykin Nearing, MD;  Location: [facility] ENDOSCOPY;  Service: Gastroenterology;  Laterality: N/A;   ENTEROSCOPY N/A 06/10/2022   Procedure: ENTEROSCOPY;  Surgeon: Toledo, Boykin Nearing, MD;  Location: [facility] ENDOSCOPY;  Service: Gastroenterology;  Laterality: N/A;   ESOPHAGOGASTRODUODENOSCOPY N/A 06/09/2022   Procedure: ESOPHAGOGASTRODUODENOSCOPY (EGD);  Surgeon: Toledo, Boykin Nearing, MD;  Location: [facility] ENDOSCOPY;  Service: Gastroenterology;  Laterality: N/A;   HEMORRHOID SURGERY  1978   HERNIA REPAIR  2008   umbilical   LIPOMA EXCISION  02/16/2017   back/ Dr Lemar Livings   NECK SURGERY     WRIST SURGERY Left 2007    Medical History: Past Medical History:  Diagnosis Date   Back problem    COPD (chronic obstructive pulmonary disease) (HCC)    Diabetes mellitus without complication (HCC)    Hyperlipidemia     Family  History: Family History  Problem Relation Age of Onset   Heart disease Mother    COPD Father    Asthma Father    Prostate cancer Neg Hx    Bladder Cancer Neg Hx    Kidney cancer Neg Hx     Social History   Socioeconomic History   Marital status: Widowed    Spouse name: Not on file   Number of children: Not on file   Years of education: Not on file    Highest education level: Not on file  Occupational History   Not on file  Tobacco Use   Smoking status: Every Day    Current packs/day: 1.00    Average packs/day: 1 pack/day for 50.0 years (50.0 ttl pk-yrs)    Types: Cigarettes   Smokeless tobacco: Never  Vaping Use   Vaping status: Never Used  Substance and Sexual Activity   Alcohol use: Yes    Comment: occasionally   Drug use: Not Currently    Types: Marijuana   Sexual activity: Yes    Birth control/protection: None  Other Topics Concern   Not on file  Social History Narrative   Not on file   Social Drivers of Health   Financial Resource Strain: Not on file  Food Insecurity: No Food Insecurity (06/05/2022)   Hunger Vital Sign    Worried About Running Out of Food in the Last Year: Never true    Ran Out of Food in the Last Year: Never true  Transportation Needs: No Transportation Needs (06/05/2022)   PRAPARE - Administrator, Civil Service (Medical): No    Lack of Transportation (Non-Medical): No  Physical Activity: Not on file  Stress: Not on file  Social Connections: Not on file  Intimate Partner Violence: Not At Risk (06/05/2022)   Humiliation, Afraid, Rape, and Kick questionnaire    Fear of Current or Ex-Partner: No    Emotionally Abused: No    Physically Abused: No    Sexually Abused: No      Review of Systems  Constitutional:  Negative for fatigue and unexpected weight change.  HENT: Negative.  Negative for congestion, ear pain and sore throat.   Respiratory:  Positive for cough (intermittent) and shortness of breath (intermittent). Negative for chest tightness and wheezing.   Cardiovascular: Negative.  Negative for chest pain and palpitations.  Gastrointestinal: Negative.   Genitourinary: Negative.  Negative for dysuria, frequency, hematuria and urgency.  Musculoskeletal:  Negative for back pain.  Skin: Negative.  Negative for rash.  Neurological:  Negative for dizziness and numbness.   Hematological: Negative.   Psychiatric/Behavioral: Negative.  Negative for behavioral problems, self-injury and suicidal ideas.     Vital Signs: BP (!) 160/80   Pulse 67   Temp 98.3 F (36.8 C)   Resp 16   Ht 5\' 9"  (1.753 m)   Wt 159 lb 6.4 oz (72.3 kg)   SpO2 96%   BMI 23.54 kg/m    Physical Exam Vitals reviewed.  Constitutional:      General: He is not in acute distress.    Appearance: Normal appearance. He is normal weight. He is not ill-appearing.  HENT:     Head: Normocephalic and atraumatic.  Eyes:     Pupils: Pupils are equal, round, and reactive to light.  Cardiovascular:     Rate and Rhythm: Normal rate and regular rhythm.     Heart sounds: Normal heart sounds. No murmur heard. Pulmonary:     Effort:  Pulmonary effort is normal. No respiratory distress.     Breath sounds: Normal breath sounds. No wheezing.  Neurological:     Mental Status: He is alert and oriented to person, place, and time.     Cranial Nerves: No cranial nerve deficit.     Coordination: Coordination normal.     Gait: Gait normal.  Psychiatric:        Mood and Affect: Mood normal.        Behavior: Behavior normal.        Assessment/Plan: 1. Benign prostatic hyperplasia without lower urinary tract symptoms - tamsulosin (FLOMAX) 0.4 MG CAPS capsule; TAKE 1 CAPSULE BY MOUTH ONCE DAILY 30 MINUTES AFTER LARGEST MEAL.  Dispense: 90 capsule; Refill: 3  2. Centrilobular emphysema (HCC) Continue anoro as before - umeclidinium-vilanterol (ANORO ELLIPTA) 62.5-25 MCG/ACT AEPB; Inhale 1 puff into the lungs daily.  Dispense: 180 each; Refill: 1  3. Anaphylactic reaction to bee sting, accidental or unintentional, initial encounter - EPINEPHrine (EPIPEN 2-PAK) 0.3 mg/0.3 mL IJ SOAJ injection; Inject 0.3 mg into the muscle as needed for anaphylaxis.  Dispense: 2 each; Refill: 2  4. Essential hypertension (Primary) Elevated in office, but not on medications and denies any symptoms. May be related to  upcoming move and can monitor.   General Counseling: Alphonsus Sias understanding of the findings of todays visit and agrees with plan of treatment. I have discussed any further diagnostic evaluation that may be needed or ordered today. We also reviewed his medications today. he has been encouraged to call the office with any questions or concerns that should arise related to todays visit.    No orders of the defined types were placed in this encounter.   Meds ordered this encounter  Medications   tamsulosin (FLOMAX) 0.4 MG CAPS capsule    Sig: TAKE 1 CAPSULE BY MOUTH ONCE DAILY 30 MINUTES AFTER LARGEST MEAL.    Dispense:  90 capsule    Refill:  3   umeclidinium-vilanterol (ANORO ELLIPTA) 62.5-25 MCG/ACT AEPB    Sig: Inhale 1 puff into the lungs daily.    Dispense:  180 each    Refill:  1   EPINEPHrine (EPIPEN 2-PAK) 0.3 mg/0.3 mL IJ SOAJ injection    Sig: Inject 0.3 mg into the muscle as needed for anaphylaxis.    Dispense:  2 each    Refill:  2    Please make sure he gets 2 pens. He needs one for work and for home.    This patient was seen by Lynn Ito, PA-C in collaboration with Dr. Beverely Risen as a part of collaborative care agreement.   Total time spent:30 Minutes Time spent includes review of chart, medications, test results, and follow up plan with the patient.      Dr Lyndon Code Internal medicine

## 2023-12-20 ENCOUNTER — Other Ambulatory Visit: Payer: Self-pay

## 2023-12-20 DIAGNOSIS — J432 Centrilobular emphysema: Secondary | ICD-10-CM

## 2024-02-10 ENCOUNTER — Telehealth: Payer: Self-pay | Admitting: Nurse Practitioner

## 2024-02-10 NOTE — Telephone Encounter (Signed)
 Left vm and sent mychart message to confirm 02/17/24 appointment-Toni

## 2024-02-15 ENCOUNTER — Telehealth: Payer: Self-pay

## 2024-02-15 NOTE — Telephone Encounter (Signed)
 Pt called today that he moved for his job and he will canceled all his appt now and maybe future back in town he will call us  back for appt

## 2024-02-17 ENCOUNTER — Ambulatory Visit: Payer: Medicare HMO | Admitting: Nurse Practitioner

## 2024-04-18 DIAGNOSIS — J449 Chronic obstructive pulmonary disease, unspecified: Secondary | ICD-10-CM | POA: Diagnosis not present

## 2024-05-30 NOTE — Progress Notes (Addendum)
 JAMILLE YOSHINO                                          MRN: [id number]   05/30/2024   The [facility] Quality Team Specialist reviewed this patient medical record for the purposes of chart review for care gap closure. The following were reviewed: chart review for care gap closure-controlling blood pressure.RECHECK CBP MEASURE.     [facility] Quality Team

## 2024-06-23 DIAGNOSIS — J441 Chronic obstructive pulmonary disease with (acute) exacerbation: Secondary | ICD-10-CM | POA: Diagnosis not present
